# Patient Record
Sex: Female | Born: 1960 | Race: Black or African American | Hispanic: No | Smoking: Never smoker
Health system: Southern US, Community
[De-identification: ages and names within clinical notes are randomized; demographics above are authoritative.]

## PROBLEM LIST (undated history)

## (undated) DIAGNOSIS — N289 Disorder of kidney and ureter, unspecified: Secondary | ICD-10-CM

## (undated) DIAGNOSIS — E0590 Thyrotoxicosis, unspecified without thyrotoxic crisis or storm: Secondary | ICD-10-CM

## (undated) DIAGNOSIS — N186 End stage renal disease: Secondary | ICD-10-CM

## (undated) DIAGNOSIS — K219 Gastro-esophageal reflux disease without esophagitis: Secondary | ICD-10-CM

## (undated) DIAGNOSIS — I10 Essential (primary) hypertension: Secondary | ICD-10-CM

## (undated) DIAGNOSIS — I509 Heart failure, unspecified: Secondary | ICD-10-CM

## (undated) HISTORY — PX: AV FISTULA PLACEMENT: SHX1204

## (undated) HISTORY — PX: VASCULAR SURGERY: SHX849

## (undated) HISTORY — PX: CARDIAC CATHETERIZATION: SHX172

---

## 2016-07-02 DIAGNOSIS — I519 Heart disease, unspecified: Secondary | ICD-10-CM | POA: Insufficient documentation

## 2016-07-02 DIAGNOSIS — I429 Cardiomyopathy, unspecified: Secondary | ICD-10-CM | POA: Insufficient documentation

## 2016-07-02 DIAGNOSIS — R011 Cardiac murmur, unspecified: Secondary | ICD-10-CM | POA: Insufficient documentation

## 2017-12-02 DIAGNOSIS — K219 Gastro-esophageal reflux disease without esophagitis: Secondary | ICD-10-CM | POA: Insufficient documentation

## 2017-12-08 DIAGNOSIS — Z992 Dependence on renal dialysis: Secondary | ICD-10-CM | POA: Insufficient documentation

## 2017-12-08 DIAGNOSIS — I2510 Atherosclerotic heart disease of native coronary artery without angina pectoris: Secondary | ICD-10-CM | POA: Insufficient documentation

## 2017-12-08 DIAGNOSIS — N186 End stage renal disease: Secondary | ICD-10-CM | POA: Diagnosis present

## 2017-12-08 DIAGNOSIS — E785 Hyperlipidemia, unspecified: Secondary | ICD-10-CM | POA: Insufficient documentation

## 2017-12-15 DIAGNOSIS — I10 Essential (primary) hypertension: Secondary | ICD-10-CM | POA: Insufficient documentation

## 2017-12-15 DIAGNOSIS — R072 Precordial pain: Secondary | ICD-10-CM | POA: Insufficient documentation

## 2017-12-24 DIAGNOSIS — K449 Diaphragmatic hernia without obstruction or gangrene: Secondary | ICD-10-CM | POA: Insufficient documentation

## 2018-01-05 DIAGNOSIS — D539 Nutritional anemia, unspecified: Secondary | ICD-10-CM | POA: Insufficient documentation

## 2020-07-20 DIAGNOSIS — L120 Bullous pemphigoid: Secondary | ICD-10-CM | POA: Insufficient documentation

## 2020-10-19 DIAGNOSIS — R0902 Hypoxemia: Secondary | ICD-10-CM | POA: Insufficient documentation

## 2020-10-30 DIAGNOSIS — I5023 Acute on chronic systolic (congestive) heart failure: Secondary | ICD-10-CM | POA: Diagnosis present

## 2020-10-30 DIAGNOSIS — J189 Pneumonia, unspecified organism: Secondary | ICD-10-CM | POA: Insufficient documentation

## 2020-12-28 DIAGNOSIS — E8770 Fluid overload, unspecified: Secondary | ICD-10-CM | POA: Insufficient documentation

## 2021-01-14 DIAGNOSIS — I509 Heart failure, unspecified: Secondary | ICD-10-CM | POA: Insufficient documentation

## 2021-04-25 DIAGNOSIS — J9691 Respiratory failure, unspecified with hypoxia: Secondary | ICD-10-CM | POA: Diagnosis present

## 2021-04-26 DIAGNOSIS — N2581 Secondary hyperparathyroidism of renal origin: Secondary | ICD-10-CM | POA: Diagnosis present

## 2021-04-30 DIAGNOSIS — E441 Mild protein-calorie malnutrition: Secondary | ICD-10-CM | POA: Insufficient documentation

## 2021-05-07 ENCOUNTER — Emergency Department (HOSPITAL_COMMUNITY): Payer: 59

## 2021-05-07 ENCOUNTER — Inpatient Hospital Stay (HOSPITAL_COMMUNITY)
Admission: EM | Admit: 2021-05-07 | Discharge: 2021-05-10 | DRG: 291 | Disposition: A | Discharge status: Home / Self Care | Payer: 59 | Source: Other Acute Inpatient Hospital | Attending: Internal Medicine | Admitting: Internal Medicine

## 2021-05-07 ENCOUNTER — Encounter (HOSPITAL_COMMUNITY): Payer: Self-pay

## 2021-05-07 ENCOUNTER — Other Ambulatory Visit: Payer: Self-pay

## 2021-05-07 DIAGNOSIS — K219 Gastro-esophageal reflux disease without esophagitis: Secondary | ICD-10-CM | POA: Diagnosis present

## 2021-05-07 DIAGNOSIS — E785 Hyperlipidemia, unspecified: Secondary | ICD-10-CM | POA: Diagnosis present

## 2021-05-07 DIAGNOSIS — Z880 Allergy status to penicillin: Secondary | ICD-10-CM

## 2021-05-07 DIAGNOSIS — I151 Hypertension secondary to other renal disorders: Secondary | ICD-10-CM

## 2021-05-07 DIAGNOSIS — Z20822 Contact with and (suspected) exposure to covid-19: Secondary | ICD-10-CM | POA: Diagnosis present

## 2021-05-07 DIAGNOSIS — J9601 Acute respiratory failure with hypoxia: Secondary | ICD-10-CM | POA: Diagnosis not present

## 2021-05-07 DIAGNOSIS — I5023 Acute on chronic systolic (congestive) heart failure: Secondary | ICD-10-CM | POA: Diagnosis not present

## 2021-05-07 DIAGNOSIS — J9691 Respiratory failure, unspecified with hypoxia: Secondary | ICD-10-CM | POA: Diagnosis present

## 2021-05-07 DIAGNOSIS — I5043 Acute on chronic combined systolic (congestive) and diastolic (congestive) heart failure: Secondary | ICD-10-CM | POA: Diagnosis present

## 2021-05-07 DIAGNOSIS — E441 Mild protein-calorie malnutrition: Secondary | ICD-10-CM | POA: Insufficient documentation

## 2021-05-07 DIAGNOSIS — Z841 Family history of disorders of kidney and ureter: Secondary | ICD-10-CM

## 2021-05-07 DIAGNOSIS — R000 Tachycardia, unspecified: Secondary | ICD-10-CM | POA: Diagnosis present

## 2021-05-07 DIAGNOSIS — N2889 Other specified disorders of kidney and ureter: Secondary | ICD-10-CM

## 2021-05-07 DIAGNOSIS — I509 Heart failure, unspecified: Secondary | ICD-10-CM

## 2021-05-07 DIAGNOSIS — I10 Essential (primary) hypertension: Secondary | ICD-10-CM | POA: Diagnosis present

## 2021-05-07 DIAGNOSIS — I132 Hypertensive heart and chronic kidney disease with heart failure and with stage 5 chronic kidney disease, or end stage renal disease: Secondary | ICD-10-CM | POA: Diagnosis not present

## 2021-05-07 DIAGNOSIS — Z888 Allergy status to other drugs, medicaments and biological substances status: Secondary | ICD-10-CM

## 2021-05-07 DIAGNOSIS — N186 End stage renal disease: Secondary | ICD-10-CM | POA: Diagnosis not present

## 2021-05-07 DIAGNOSIS — Z7982 Long term (current) use of aspirin: Secondary | ICD-10-CM

## 2021-05-07 DIAGNOSIS — E872 Acidosis: Secondary | ICD-10-CM | POA: Diagnosis present

## 2021-05-07 DIAGNOSIS — Z681 Body mass index (BMI) 19 or less, adult: Secondary | ICD-10-CM

## 2021-05-07 DIAGNOSIS — Z79899 Other long term (current) drug therapy: Secondary | ICD-10-CM

## 2021-05-07 DIAGNOSIS — I248 Other forms of acute ischemic heart disease: Secondary | ICD-10-CM | POA: Diagnosis present

## 2021-05-07 DIAGNOSIS — Z992 Dependence on renal dialysis: Secondary | ICD-10-CM

## 2021-05-07 DIAGNOSIS — I5022 Chronic systolic (congestive) heart failure: Secondary | ICD-10-CM | POA: Diagnosis present

## 2021-05-07 DIAGNOSIS — E43 Unspecified severe protein-calorie malnutrition: Secondary | ICD-10-CM | POA: Insufficient documentation

## 2021-05-07 DIAGNOSIS — M898X9 Other specified disorders of bone, unspecified site: Secondary | ICD-10-CM | POA: Diagnosis present

## 2021-05-07 DIAGNOSIS — J811 Chronic pulmonary edema: Secondary | ICD-10-CM | POA: Diagnosis present

## 2021-05-07 DIAGNOSIS — J9621 Acute and chronic respiratory failure with hypoxia: Secondary | ICD-10-CM | POA: Diagnosis present

## 2021-05-07 DIAGNOSIS — D631 Anemia in chronic kidney disease: Secondary | ICD-10-CM | POA: Diagnosis present

## 2021-05-07 DIAGNOSIS — D7589 Other specified diseases of blood and blood-forming organs: Secondary | ICD-10-CM | POA: Diagnosis present

## 2021-05-07 HISTORY — DX: Heart failure, unspecified: I50.9

## 2021-05-07 HISTORY — DX: End stage renal disease: N18.6

## 2021-05-07 HISTORY — DX: Essential (primary) hypertension: I10

## 2021-05-07 HISTORY — DX: Disorder of kidney and ureter, unspecified: N28.9

## 2021-05-07 LAB — BASIC METABOLIC PANEL
Anion gap: 14 (ref 5–15)
BUN: 19 mg/dL (ref 8–23)
CO2: 27 mmol/L (ref 22–32)
Calcium: 12.5 mg/dL — ABNORMAL HIGH (ref 8.9–10.3)
Chloride: 99 mmol/L (ref 98–111)
Creatinine, Ser: 7.38 mg/dL — ABNORMAL HIGH (ref 0.44–1.00)
GFR, Estimated: 6 mL/min — ABNORMAL LOW (ref >60)
Glucose, Bld: 95 mg/dL (ref 70–99)
Potassium: 4.5 mmol/L (ref 3.5–5.1)
Sodium: 140 mmol/L (ref 135–145)

## 2021-05-07 LAB — CBC WITH DIFFERENTIAL/PLATELET
Abs Immature Granulocytes: 0.02 10*3/uL (ref 0.00–0.07)
Basophils Absolute: 0 10*3/uL (ref 0.0–0.1)
Basophils Relative: 1 %
Eosinophils Absolute: 0 10*3/uL (ref 0.0–0.5)
Eosinophils Relative: 1 %
HCT: 29.6 % — ABNORMAL LOW (ref 36.0–46.0)
Hemoglobin: 9.2 g/dL — ABNORMAL LOW (ref 12.0–15.0)
Immature Granulocytes: 0 %
Lymphocytes Relative: 24 %
Lymphs Abs: 1.4 10*3/uL (ref 0.7–4.0)
MCH: 33.2 pg (ref 26.0–34.0)
MCHC: 31.1 g/dL (ref 30.0–36.0)
MCV: 106.9 fL — ABNORMAL HIGH (ref 80.0–100.0)
Monocytes Absolute: 0.5 10*3/uL (ref 0.1–1.0)
Monocytes Relative: 8 %
Neutro Abs: 3.9 10*3/uL (ref 1.7–7.7)
Neutrophils Relative %: 66 %
Platelets: 185 10*3/uL (ref 150–400)
RBC: 2.77 MIL/uL — ABNORMAL LOW (ref 3.87–5.11)
RDW: 16 % — ABNORMAL HIGH (ref 11.5–15.5)
WBC: 5.8 10*3/uL (ref 4.0–10.5)
nRBC: 0 % (ref 0.0–0.2)

## 2021-05-07 LAB — BLOOD GAS, VENOUS
Acid-Base Excess: 2.4 mmol/L — ABNORMAL HIGH (ref 0.0–2.0)
Bicarbonate: 28.1 mmol/L — ABNORMAL HIGH (ref 20.0–28.0)
O2 Saturation: 15.7 %
Patient temperature: 98.6
pCO2, Ven: 52.8 mmHg (ref 44.0–60.0)
pH, Ven: 7.346 (ref 7.250–7.430)
pO2, Ven: <31 mmHg — CL (ref 32.0–45.0)

## 2021-05-07 LAB — RESP PANEL BY RT-PCR (FLU A&B, COVID) ARPGX2
Influenza A by PCR: NEGATIVE
Influenza B by PCR: NEGATIVE
SARS Coronavirus 2 by RT PCR: NEGATIVE

## 2021-05-07 LAB — MAGNESIUM: Magnesium: 2 mg/dL (ref 1.7–2.4)

## 2021-05-07 LAB — TROPONIN I (HIGH SENSITIVITY): Troponin I (High Sensitivity): 91 ng/L — ABNORMAL HIGH (ref <18)

## 2021-05-07 LAB — BRAIN NATRIURETIC PEPTIDE: B Natriuretic Peptide: >4500 pg/mL — ABNORMAL HIGH (ref 0.0–100.0)

## 2021-05-07 LAB — LACTIC ACID, PLASMA: Lactic Acid, Venous: 3.2 mmol/L (ref 0.5–1.9)

## 2021-05-07 MED ORDER — METOPROLOL TARTRATE 5 MG/5ML IV SOLN: 5.0000 mg | INTRAVENOUS | Status: DC

## 2021-05-07 MED ORDER — HEPARIN SODIUM (PORCINE) 5000 UNIT/ML IJ SOLN
5000.0000 [IU] | Freq: Three times a day (TID) | INTRAMUSCULAR | Status: DC
  Administered 2021-05-08 – 2021-05-10 (×3): 5000 [IU] via SUBCUTANEOUS
  Filled 2021-05-07 (×3): qty 1

## 2021-05-07 MED ORDER — METOPROLOL TARTRATE 50 MG PO TABS
50.0000 mg | ORAL_TABLET | Freq: Two times a day (BID) | ORAL | Status: AC
  Administered 2021-05-07 – 2021-05-08 (×3): 50 mg via ORAL
  Filled 2021-05-07: qty 1
  Filled 2021-05-07: qty 2
  Filled 2021-05-07 (×2): qty 1

## 2021-05-07 MED ORDER — SODIUM CHLORIDE 0.9 % IV SOLN
2.0000 g | Freq: Once | INTRAVENOUS | Status: AC
  Administered 2021-05-07: 2 g via INTRAVENOUS
  Filled 2021-05-07: qty 2

## 2021-05-07 MED ORDER — ONDANSETRON HCL 4 MG/2ML IJ SOLN
INTRAMUSCULAR | Status: AC
  Administered 2021-05-07: 4 mg via INTRAVENOUS
  Filled 2021-05-07: qty 2

## 2021-05-07 MED ORDER — ATORVASTATIN CALCIUM 40 MG PO TABS
40.0000 mg | ORAL_TABLET | Freq: Every day | ORAL | Status: DC
  Administered 2021-05-08 – 2021-05-09 (×2): 40 mg via ORAL
  Filled 2021-05-07 (×2): qty 1

## 2021-05-07 MED ORDER — VANCOMYCIN HCL IN DEXTROSE 1-5 GM/200ML-% IV SOLN
1000.0000 mg | Freq: Once | INTRAVENOUS | Status: DC
  Filled 2021-05-07: qty 200

## 2021-05-07 MED ORDER — IOHEXOL 350 MG/ML SOLN
80.0000 mL | Freq: Once | INTRAVENOUS | Status: AC | PRN
  Administered 2021-05-07: 80 mL via INTRAVENOUS

## 2021-05-07 MED ORDER — NITROGLYCERIN 2 % TD OINT
1.0000 [in_us] | TOPICAL_OINTMENT | Freq: Once | TRANSDERMAL | Status: AC
  Administered 2021-05-07: 1 [in_us] via TOPICAL
  Filled 2021-05-07: qty 30

## 2021-05-07 MED ORDER — ACETAMINOPHEN 650 MG RE SUPP: 650.0000 mg | Freq: Four times a day (QID) | RECTAL | Status: DC | PRN

## 2021-05-07 MED ORDER — ACETAMINOPHEN 325 MG PO TABS: 650.0000 mg | ORAL_TABLET | Freq: Four times a day (QID) | ORAL | Status: DC | PRN

## 2021-05-07 MED ORDER — ISOSORBIDE MONONITRATE ER 30 MG PO TB24
30.0000 mg | ORAL_TABLET | Freq: Every day | ORAL | Status: DC
  Administered 2021-05-08 – 2021-05-10 (×3): 30 mg via ORAL
  Filled 2021-05-07 (×4): qty 1

## 2021-05-07 MED ORDER — ONDANSETRON HCL 4 MG/2ML IJ SOLN
4.0000 mg | Freq: Four times a day (QID) | INTRAMUSCULAR | Status: DC | PRN
  Administered 2021-05-09: 4 mg via INTRAVENOUS
  Filled 2021-05-07: qty 2

## 2021-05-07 MED ORDER — CINACALCET HCL 30 MG PO TABS
30.0000 mg | ORAL_TABLET | Freq: Every day | ORAL | Status: DC
  Administered 2021-05-09: 30 mg via ORAL
  Filled 2021-05-07 (×3): qty 1

## 2021-05-07 MED ORDER — ONDANSETRON HCL 4 MG PO TABS: 4.0000 mg | ORAL_TABLET | Freq: Four times a day (QID) | ORAL | Status: DC | PRN

## 2021-05-07 NOTE — Progress Notes (Signed)
After PO metoprolol: HR now 112 and SBP 130, CP has improved / resolved.  Will hold off on IV metoprolol.

## 2021-05-07 NOTE — Progress Notes (Signed)
.  Transition of Care [facility]) - Emergency Department Mini Assessment   Patient Details  Name: Cynthia Bray MRN: [id number] Date of Birth: August 28, 1961  Transition of Care [facility]) CM/SW Contact:    Erenest Rasher, RN Phone Number: [phone] 05/07/2021, 9:16 PM   Clinical Narrative: TOC CM gave permission to speak to son, [name]. States she recently move in with niece, Hilda Blades. States he and niece assist pt to appt. States recently her medications came to $100 and not sure what medication was expensive. Her pharmacy is currently closed and unable to get information on medications. Pt has Medicaid that covers her meds. Discussed DME such as RW, oxygen or cane. Son states she does not have DME. Pt last admission to [facility] they were suppose to ship DME but he is not sure what equipment. Will continue to follow for dc needs.    ED Mini Assessment: What brought you to the Emergency Department? : shortness of breath  Barriers to Discharge: Continued Medical Work up        Interventions which prevented an admission or readmission: DME Provided, Other (must enter comment)    Patient Contact and Communications Key Contact 1: Tobie Poet   Spoke with: son Contact Date: 05/07/21,   Contact time: 2116 Contact Phone Number: [phone]           Admission diagnosis:  Chest Pain There are no problems to display for this patient.  PCP:  Pcp, No Pharmacy:   [place], [place] [address] Phone: [phone] Fax: [phone]

## 2021-05-07 NOTE — H&P (Signed)
History and Physical    Cynthia Bray ZOX:[id number] DOB: 03-Jun-1961 DOA: 05/07/2021  PCP: Pcp, No  Patient coming from: Home  I have personally briefly reviewed patient's old medical records in [place]  Chief Complaint: CP  HPI: Cynthia Bray is a 59 y.o. 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  Pt with recent admits to [facility], most recently 7/15 with CP, pulm edema, hypertension, tachycardia, and lactic acidosis.  Resp failure with initial BIPAP requirement.  Presentation felt to be due to volume overload and pt improved with dialysis.  Pt also put on BB and imdur.  Today pt went to dialysis, started having CP with dialysis so dialysis stopped part way through session and pt sent to ED.  Pt said she started feeling SOB yesterday, progressively worsened.  No leg swelling, no fevers, sore throat, congestion.   ED Course: Tachy to 140 initially, now 150.  RR 30-40, BP 811B-147W systolic.  HGB 9.2  WBC nl.  BNP > 4500, trop 91  CT chest: 1) CHF with pulm edema 2) B pulm consolidations concerning for multilobar PNA  COVID neg.  EDP gave cefepime + vanc.   Review of Systems: As per HPI, otherwise all review of systems negative.  Past Medical History:  Diagnosis Date   CHF (congestive heart failure) (HCC)    EF 25%   ESRD (end stage renal disease) ([place])    Hypertension    Renal disorder     Past Surgical History:  Procedure Laterality Date   AV FISTULA PLACEMENT       reports that she has never smoked. She has never used smokeless tobacco. She reports that she does not drink alcohol and does not use drugs.  Allergies  Allergen Reactions   Lisinopril     Angioedema     Family History  Problem Relation Age of Onset   Kidney disease Mother      Prior to Admission medications   Not on File    Physical Exam: Vitals:   05/07/21  1830 05/07/21 1900 05/07/21 2030 05/07/21 2100  BP: (!) 172/100 (!) 171/103 (!) 164/94 (!) 164/98  Pulse: (!) 143 (!) 144 (!) 138 (!) 138  Resp: (!) 28 (!) 36 (!) 27 (!) 31  Temp:      TempSrc:      SpO2: 97% 91% 98% 95%  Weight:      Height:        Constitutional: NAD, mild discomfort, no respiratory distress Eyes: PERRL, lids and conjunctivae normal ENMT: Mucous membranes are moist. Posterior pharynx clear of any exudate or lesions.Normal dentition.  Neck: normal, supple, no masses, no thyromegaly Respiratory: Bibasilar rhonchi Cardiovascular: Severe tachycardia Abdomen: no tenderness, no masses palpated. No hepatosplenomegaly. Bowel sounds positive.  Musculoskeletal: no clubbing / cyanosis. No joint deformity upper and lower extremities. Good ROM, no contractures. Normal muscle tone.  Skin: no rashes, lesions, ulcers. No induration Neurologic: CN 2-12 grossly intact. Sensation intact, DTR normal. Strength 5/5 in all 4.  Psychiatric: Normal judgment and insight. Alert and oriented x 3. Normal mood.    Labs on Admission: I have personally reviewed following labs and imaging studies  CBC: Recent Labs  Lab 05/07/21 1851  WBC 5.8  NEUTROABS 3.9  HGB 9.2*  HCT 29.6*  MCV 106.9*  PLT 295   Basic Metabolic Panel: Recent Labs  Lab 05/07/21 1656  NA 140  K 4.5  CL 99  CO2 27  GLUCOSE 95  BUN 19  CREATININE 7.38*  CALCIUM 12.5*  MG 2.0   GFR: Estimated Creatinine Clearance: 6.6 mL/min (A) (by C-G formula based on SCr of 7.38 mg/dL (H)). Liver Function Tests: No results for input(s): AST, ALT, ALKPHOS, BILITOT, PROT, ALBUMIN in the last 168 hours. No results for input(s): LIPASE, AMYLASE in the last 168 hours. No results for input(s): AMMONIA in the last 168 hours. Coagulation Profile: No results for input(s): INR, PROTIME in the last 168 hours. Cardiac Enzymes: No results for input(s): CKTOTAL, CKMB, CKMBINDEX, TROPONINI in the last 168 hours. BNP (last 3  results) No results for input(s): PROBNP in the last 8760 hours. HbA1C: No results for input(s): HGBA1C in the last 72 hours. CBG: No results for input(s): GLUCAP in the last 168 hours. Lipid Profile: No results for input(s): CHOL, HDL, LDLCALC, TRIG, CHOLHDL, LDLDIRECT in the last 72 hours. Thyroid Function Tests: No results for input(s): TSH, T4TOTAL, FREET4, T3FREE, THYROIDAB in the last 72 hours. Anemia Panel: No results for input(s): VITAMINB12, FOLATE, FERRITIN, TIBC, IRON, RETICCTPCT in the last 72 hours. Urine analysis: No results found for: COLORURINE, APPEARANCEUR, LABSPEC, [place], GLUCOSEU, HGBUR, BILIRUBINUR, KETONESUR, PROTEINUR, UROBILINOGEN, NITRITE, LEUKOCYTESUR  Radiological Exams on Admission: DG Chest 2 View  Result Date: 05/07/2021 CLINICAL DATA:  Chest pain since yesterday. EXAM: CHEST - 2 VIEW COMPARISON:  Single-view of the chest earlier today and 04/25/2021. FINDINGS: Right basilar airspace disease is worse than on the study earlier today. Left basilar airspace disease appears unchanged. There small bilateral pleural effusions. No pneumothorax. Cardiomegaly. IMPRESSION: Right basilar airspace disease is worse on the study earlier today could be due to increased atelectasis or pneumonia. Left basilar airspace disease is unchanged. Small bilateral pleural effusions are unchanged. Cardiomegaly. Electronically Signed   By: Inge Rise M.D.   On: 05/07/2021 18:55   CT Angio Chest PE W/Cm &/Or Wo Cm  Result Date: 05/07/2021 CLINICAL DATA:  60 year old female with concern for pulmonary embolism. EXAM: CT ANGIOGRAPHY CHEST WITH CONTRAST TECHNIQUE: Multidetector CT imaging of the chest was performed using the standard protocol during bolus administration of intravenous contrast. Multiplanar CT image reconstructions and MIPs were obtained to evaluate the vascular anatomy. CONTRAST:  58mL OMNIPAQUE IOHEXOL 350 MG/ML SOLN COMPARISON:  Chest radiograph dated 05/07/2021.  FINDINGS: Cardiovascular: There is cardiomegaly. No pericardial effusion. Mild atherosclerotic calcification of the thoracic aorta. Evaluation of the aorta is limited due to suboptimal opacification and streak artifact caused by patient's arms. Evaluation of the pulmonary arteries is limited due to respiratory motion artifact as well as streak artifact caused by patient's arms and suboptimal visualization of the peripheral branches. No pulmonary artery embolus identified. Mediastinum/Nodes: Right hilar adenopathy measures 2 cm. Evaluation of lymph node is very limited due to consolidative changes of the lungs. The esophagus is grossly unremarkable. No mediastinal fluid collection. Lungs/Pleura: Small bilateral pleural effusions. There is patchy areas of consolidative changes primarily involving the lower lobes. Patchy left upper lobe as well as diffuse ground-glass density in the right upper lobe noted. Findings concerning for multi lobar pneumonia. Clinical correlation and follow-up to resolution recommended. No pneumothorax. The central airways are patent. Upper Abdomen: Bilateral renal atrophy. Musculoskeletal: There is diffuse subcutaneous edema and anasarca. There is osteosclerosis consistent with renal osteodystrophy. No acute osseous pathology. Review of the MIP images confirms the above findings. IMPRESSION: 1. No CT evidence of pulmonary embolism. 2. Cardiomegaly with findings of CHF and small bilateral pleural  effusions. 3. Bilateral pulmonary consolidations concerning for multilobar pneumonia. Clinical correlation and follow-up to resolution recommended. 4. Aortic Atherosclerosis (ICD10-I70.0). Electronically Signed   By: Anner Crete M.D.   On: 05/07/2021 20:54    EKG: Independently reviewed.  Assessment/Plan Principal Problem:   Acute on chronic systolic CHF (congestive heart failure) (HCC) Active Problems:   ESRD (end stage renal disease) (HCC)   Hypercalcemia   HTN (hypertension)    Tachycardia    Acute on chronic systolic CHF - volume overload Transfer pt to [facility] for nephrology consult and likely dialysis Tele monitor Check serial trops with CP Tachycardia and uncontrolled HTN - Resume metoprolol 50mg  PO BID started during last admit at [facility] (gave PO dose in ED already) Giving 5mg  IV lopressor now Resume home IMDUR in AM Holding losartan (pt with h/o angioedema to lisinopril). ESRD - EDP spoke with nephrology, pt being admitted over to [facility] Hypercalcemia - Cont cinamet Hopefully to improve with dialysis ? Of PNA - Got cefepime + vanc in ED Check procalcitonin Holding off on further ABx orders for the moment pending procalcitonin results.  DVT prophylaxis: Heparin [place] Code Status: Full Family Communication: No family in room Disposition Plan: Home after improvement in CHF Consults called: EDP spoke with nephrology Dr. Johnney Ou Admission status: Place in obs     Lula Kolton, [place] Hospitalists  How to contact the [facility] Attending or Consulting provider [place] or covering provider during after hours [place], for this patient?  Check the care team in [facility] and look for a) attending/consulting TRH provider listed and b) the [facility] team listed Log into [email or web address]  Amion Physician Scheduling and messaging for groups and whole hospitals  On call and physician scheduling software for group practices, residents, hospitalists and other medical providers for call, clinic, rotation and shift schedules. On[employer] is a hospital-wide system for scheduling doctors and paging doctors on call. EasyPlot is for scientific plotting and data analysis.  [email or web address]  and use [place]'s universal password to access. If you do not have the password, please contact the hospital operator.  Locate the [facility] provider you are looking for under [facility] and page to a number that you can be directly reached. If you still have difficulty reaching the provider, please  page the [facility] (Director on Call) for the Hospitalists listed on amion for assistance.  05/07/2021, 10:48 PM

## 2021-05-07 NOTE — Progress Notes (Signed)
A consult was received from an ED physician for vanc per pharmacy dosing.  The patient's profile has been reviewed for ht/wt/allergies/indication/available labs.   A one time order has been placed for vanc 1g.  Further antibiotics/pharmacy consults should be ordered by admitting physician if indicated.                       Thank you, Kara Mead 05/07/2021  9:13 PM

## 2021-05-07 NOTE — ED Provider Notes (Signed)
Emergency Medicine Provider Triage Evaluation Note  Cynthia Bray , a 60 y.o. female  was evaluated in triage.  Pt complains of patient presents with shortness of breath, started yesterday, states she has a active cough, she denies chest pain, becoming diaphoretic, nausea, vomiting, lightheaded or dizziness.  She has no cardiac history, no history of PEs or DVTs, currently not on hormone therapy.  She is on dialysis, she goes Tuesday Thursday Saturday, got her full treatment on Saturday unfortunately got half of it today, she has no other complaints at this time..  Review of Systems  Positive: Shortness of breath, cough Negative: Chest pain, abdominal pain  Physical Exam  BP (!) 173/100 (BP Location: Left Arm)   Pulse (!) 140   Temp 98.1 F (36.7 C) (Oral)   Resp (!) 30   Ht 5\' 8"  (1.727 m)   Wt 52.2 kg   SpO2 96%   BMI 17.49 kg/m  Gen:   Awake, no distress   Resp:  Normal effort  MSK:   Moves extremities without difficulty  Other:    Medical Decision Making  Medically screening exam initiated at 4:56 PM.  Appropriate orders placed.  Randolm Idol was informed that the remainder of the evaluation will be completed by another provider, this initial triage assessment does not replace that evaluation, and the importance of remaining in the ED until their evaluation is complete.  Patient presents with shortness of breath, I am concerned for underlying cardiac abnormality and I recommend make this patient a level 2 and rooming this patient immediately as she will need further work-up.   Marcello Fennel, PA-C 05/07/21 1658    Malvin Johns, MD 05/07/21 [phone]

## 2021-05-07 NOTE — ED Provider Notes (Addendum)
[place] DEPT Provider Note   CSN: [id number] Arrival date & time: 05/07/21  1620     History Chief Complaint  Patient presents with   Chest Pain    Cynthia Bray is a 60 y.o. female.  60 year old female with past medical history below including ESRD on HD T/Th/Sat, CHF w/ EF 20-30%, HTN who p/w shortness of breath and chest pain.  Patient states that she began feeling short of breath yesterday and it has progressively worsened.  She has had intermittent chest pain for the past few days that is not sharp, is central and nonradiating.  She went to dialysis earlier today but they stopped her session part of the way through because of her complaints and sent her to the ED for evaluation.  Son states that they went to [facility] where they waited for a while and then decided to come here.  She reports cough which is mild, no fevers, congestion, sore throat, or sick contacts.  No leg swelling.  She got approximately half of her dialysis session today.  Son notes that she has had several recent hospitalizations for similar complaints.    Chart review shows that she was admitted on 7/14 at [facility] for respiratory failure with volume overload and CHF exacerbation as well as bacteremia.  The history is provided by the patient and a relative.  Chest Pain     Past Medical History:  Diagnosis Date   CHF (congestive heart failure) ([place])    Hypertension    Renal disorder     Patient Active Problem List   Diagnosis Date Noted   Acute on chronic systolic CHF (congestive heart failure) ([place]) 05/07/2021    Past Surgical History:  Procedure Laterality Date   AV FISTULA PLACEMENT       OB History   No obstetric history on file.     History reviewed. No pertinent family history.  Social History   Tobacco Use   Smoking status: Never   Smokeless tobacco: Never  Vaping Use   Vaping Use: Never used  Substance Use Topics   Alcohol  use: Never   Drug use: Never    Home Medications Prior to Admission medications   Not on File    Allergies    Patient has no known allergies.  Review of Systems   Review of Systems  Cardiovascular:  Positive for chest pain.  All other systems reviewed and are negative except that which was mentioned in HPI  Physical Exam Updated Vital Signs BP (!) 164/98   Pulse (!) 138   Temp 98.1 F (36.7 C) (Oral)   Resp (!) 31   Ht 5\' 8"  (1.727 m)   Wt 52.2 kg   SpO2 95%   BMI 17.49 kg/m   Physical Exam Constitutional:      General: She is not in acute distress.    Appearance: Normal appearance.  HENT:     Head: Normocephalic and atraumatic.  Eyes:     Conjunctiva/sclera: Conjunctivae normal.  Cardiovascular:     Rate and Rhythm: Regular rhythm. Tachycardia present.     Heart sounds: Murmur heard.  Pulmonary:     Comments: Mild tachypnea with diminished breath sounds bilaterally, faint crackles in bases, no respiratory distress Abdominal:     General: Abdomen is flat. Bowel sounds are normal. There is no distension.     Palpations: Abdomen is soft.     Tenderness: There is no abdominal tenderness.  Musculoskeletal:  Right lower leg: No edema.     Left lower leg: No edema.  Skin:    General: Skin is warm and dry.  Neurological:     Mental Status: She is alert and oriented to person, place, and time.     Comments: fluent  Psychiatric:        Mood and Affect: Mood normal.        Behavior: Behavior normal.    ED Results / Procedures / Treatments   Labs (all labs ordered are listed, but only abnormal results are displayed) Labs Reviewed  BASIC METABOLIC PANEL - Abnormal; Notable for the following components:      Result Value   Creatinine, Ser 7.38 (*)    Calcium 12.5 (*)    GFR, Estimated 6 (*)    All other components within normal limits  LACTIC ACID, PLASMA - Abnormal; Notable for the following components:   Lactic Acid, Venous 3.2 (*)    All other  components within normal limits  BRAIN NATRIURETIC PEPTIDE - Abnormal; Notable for the following components:   B Natriuretic Peptide >4,500.0 (*)    All other components within normal limits  BLOOD GAS, VENOUS - Abnormal; Notable for the following components:   pO2, Ven <31.0 (*)    Bicarbonate 28.1 (*)    Acid-Base Excess 2.4 (*)    All other components within normal limits  CBC WITH DIFFERENTIAL/PLATELET - Abnormal; Notable for the following components:   RBC 2.77 (*)    Hemoglobin 9.2 (*)    HCT 29.6 (*)    MCV 106.9 (*)    RDW 16.0 (*)    All other components within normal limits  TROPONIN I (HIGH SENSITIVITY) - Abnormal; Notable for the following components:   Troponin I (High Sensitivity) 91 (*)    All other components within normal limits  RESP PANEL BY RT-PCR (FLU A&B, COVID) ARPGX2  CULTURE, BLOOD (ROUTINE X 2)  CULTURE, BLOOD (ROUTINE X 2)  MAGNESIUM  CBC WITH DIFFERENTIAL/PLATELET  LACTIC ACID, PLASMA  PROCALCITONIN  TROPONIN I (HIGH SENSITIVITY)    EKG EKG Interpretation  Date/Time:  Tuesday May 07 2021 16:35:16 EDT Ventricular Rate:  143 PR Interval:  126 QRS Duration: 73 QT Interval:  355 QTC Calculation: 548 R Axis:   46 Text Interpretation: Sinus tachycardia Anteroseptal infarct, age indeterminate Prolonged QT interval Baseline wander in lead(s) I III aVL 12 Lead; [place] No previous ECGs available non-specific T wave flattening Confirmed by Theotis Burrow [phone]) on 05/07/2021 7:57:31 PM  Radiology DG Chest 2 View  Result Date: 05/07/2021 CLINICAL DATA:  Chest pain since yesterday. EXAM: CHEST - 2 VIEW COMPARISON:  Single-view of the chest earlier today and 04/25/2021. FINDINGS: Right basilar airspace disease is worse than on the study earlier today. Left basilar airspace disease appears unchanged. There small bilateral pleural effusions. No pneumothorax. Cardiomegaly. IMPRESSION: Right basilar airspace disease is worse on the study earlier today could  be due to increased atelectasis or pneumonia. Left basilar airspace disease is unchanged. Small bilateral pleural effusions are unchanged. Cardiomegaly. Electronically Signed   By: Inge Rise M.D.   On: 05/07/2021 18:55   CT Angio Chest PE W/Cm &/Or Wo Cm  Result Date: 05/07/2021 CLINICAL DATA:  60 year old female with concern for pulmonary embolism. EXAM: CT ANGIOGRAPHY CHEST WITH CONTRAST TECHNIQUE: Multidetector CT imaging of the chest was performed using the standard protocol during bolus administration of intravenous contrast. Multiplanar CT image reconstructions and MIPs were obtained to evaluate the vascular anatomy. CONTRAST:  98mL OMNIPAQUE IOHEXOL 350 MG/ML SOLN COMPARISON:  Chest radiograph dated 05/07/2021. FINDINGS: Cardiovascular: There is cardiomegaly. No pericardial effusion. Mild atherosclerotic calcification of the thoracic aorta. Evaluation of the aorta is limited due to suboptimal opacification and streak artifact caused by patient's arms. Evaluation of the pulmonary arteries is limited due to respiratory motion artifact as well as streak artifact caused by patient's arms and suboptimal visualization of the peripheral branches. No pulmonary artery embolus identified. Mediastinum/Nodes: Right hilar adenopathy measures 2 cm. Evaluation of lymph node is very limited due to consolidative changes of the lungs. The esophagus is grossly unremarkable. No mediastinal fluid collection. Lungs/Pleura: Small bilateral pleural effusions. There is patchy areas of consolidative changes primarily involving the lower lobes. Patchy left upper lobe as well as diffuse ground-glass density in the right upper lobe noted. Findings concerning for multi lobar pneumonia. Clinical correlation and follow-up to resolution recommended. No pneumothorax. The central airways are patent. Upper Abdomen: Bilateral renal atrophy. Musculoskeletal: There is diffuse subcutaneous edema and anasarca. There is osteosclerosis  consistent with renal osteodystrophy. No acute osseous pathology. Review of the MIP images confirms the above findings. IMPRESSION: 1. No CT evidence of pulmonary embolism. 2. Cardiomegaly with findings of CHF and small bilateral pleural effusions. 3. Bilateral pulmonary consolidations concerning for multilobar pneumonia. Clinical correlation and follow-up to resolution recommended. 4. Aortic Atherosclerosis (ICD10-I70.0). Electronically Signed   By: Anner Crete M.D.   On: 05/07/2021 20:54    Procedures Procedures  CRITICAL CARE Performed by: Wenda Overland Kayna Suppa   Total critical care time: 30 minutes  Critical care time was exclusive of separately billable procedures and treating other patients.  Critical care was necessary to treat or prevent imminent or life-threatening deterioration.  Critical care was time spent personally by me on the following activities: development of treatment plan with patient and/or surrogate as well as nursing, discussions with consultants, evaluation of patient's response to treatment, examination of patient, obtaining history from patient or surrogate, ordering and performing treatments and interventions, ordering and review of laboratory studies, ordering and review of radiographic studies, pulse oximetry and re-evaluation of patient's condition.  Medications Ordered in ED Medications  ceFEPIme (MAXIPIME) 2 g in sodium chloride 0.9 % 100 mL IVPB (has no administration in time range)  vancomycin (VANCOCIN) IVPB 1000 mg/200 mL premix (has no administration in time range)  nitroGLYCERIN (NITROGLYN) 2 % ointment 1 inch (1 inch Topical Given 05/07/21 2134)  iohexol (OMNIPAQUE) 350 MG/ML injection 80 mL (80 mLs Intravenous Contrast Given 05/07/21 2013)    ED Course  I have reviewed the triage vital signs and the nursing notes.  Pertinent labs & imaging results that were available during my care of the patient were reviewed by me and considered in my medical  decision making (see chart for details).    MDM Rules/Calculators/A&P                           Tachypneic and tachycardic on exam, requiring 2 to 3 L of oxygen, no distress.  VBG reassuring.  Lab work shows normal WBC count, hemoglobin 9.2, COVID-negative, potassium 4.5, calcium 12.5, troponin 91 which is likely demand his BNP is greater than 4500, lactate 3.2 VBG reassuring with pH 7.35, PCO2 53.  Obtain CTA of chest to rule out PE given the patient's tachycardia, chest pain, and recent hospitalization.  Imaging negative for PE but does show congestive heart failure findings with bilateral pleural effusions.  She has some areas  of consolidations that may represent multi lobar pneumonia.  Because of her recent hospitalization, will cover for HCAP w/ vanc and cefepime; blood cultures sent. Discussed w/ nephrology, Dr. Johnney Ou, who recommended admission to [facility] so that she can receive dialysis.  I gave the patient Nitropaste to help with blood pressure and breathing.  Discussed admission with [facility] hospitalist, Dr. Alcario Drought.  Cynthia Bray was evaluated in Emergency Department on 05/08/2021 for the symptoms described in the history of present illness. She was evaluated in the context of the global COVID-19 pandemic, which necessitated consideration that the patient might be at risk for infection with the SARS-CoV-2 virus that causes COVID-19. Institutional protocols and algorithms that pertain to the evaluation of patients at risk for COVID-19 are in a state of rapid change based on information released by regulatory bodies including the CDC and federal and state organizations. These policies and algorithms were followed during the patient's care in the ED.  Final Clinical Impression(s) / ED Diagnoses Final diagnoses:  Acute respiratory failure with hypoxia ([place])  Acute on chronic congestive heart failure, unspecified heart failure type [facility])    Rx / DC Orders ED Discharge Orders     None         Nemesis Rainwater, Wenda Overland, MD 05/07/21 2150    Rex Kras, Wenda Overland, MD 05/08/21 0011

## 2021-05-07 NOTE — ED Notes (Signed)
Unsuccessful IV attempt x 2.  Ronalee Belts RN asked to attempt.

## 2021-05-07 NOTE — ED Notes (Signed)
Pt noted to have O2 sat between 87-90% RA.  Pt placed on 3L [place].  Sat increased to 91%.

## 2021-05-07 NOTE — ED Triage Notes (Signed)
Pt to er, pt states that she is here for chest pain, states that she gets dialysis tues, thurs, Saturday, states that her pain started Monday, states that the pain is off and on, states that she also has some sob

## 2021-05-08 ENCOUNTER — Observation Stay (HOSPITAL_BASED_OUTPATIENT_CLINIC_OR_DEPARTMENT_OTHER): Payer: 59

## 2021-05-08 ENCOUNTER — Encounter (HOSPITAL_COMMUNITY): Payer: Self-pay | Admitting: Internal Medicine

## 2021-05-08 DIAGNOSIS — Z992 Dependence on renal dialysis: Secondary | ICD-10-CM | POA: Diagnosis not present

## 2021-05-08 DIAGNOSIS — R011 Cardiac murmur, unspecified: Secondary | ICD-10-CM

## 2021-05-08 DIAGNOSIS — I5043 Acute on chronic combined systolic (congestive) and diastolic (congestive) heart failure: Secondary | ICD-10-CM | POA: Diagnosis not present

## 2021-05-08 DIAGNOSIS — Z888 Allergy status to other drugs, medicaments and biological substances status: Secondary | ICD-10-CM | POA: Diagnosis not present

## 2021-05-08 DIAGNOSIS — D631 Anemia in chronic kidney disease: Secondary | ICD-10-CM | POA: Diagnosis not present

## 2021-05-08 DIAGNOSIS — I5023 Acute on chronic systolic (congestive) heart failure: Secondary | ICD-10-CM | POA: Diagnosis not present

## 2021-05-08 DIAGNOSIS — J9601 Acute respiratory failure with hypoxia: Secondary | ICD-10-CM

## 2021-05-08 DIAGNOSIS — E785 Hyperlipidemia, unspecified: Secondary | ICD-10-CM | POA: Diagnosis not present

## 2021-05-08 DIAGNOSIS — K219 Gastro-esophageal reflux disease without esophagitis: Secondary | ICD-10-CM | POA: Diagnosis not present

## 2021-05-08 DIAGNOSIS — N186 End stage renal disease: Secondary | ICD-10-CM | POA: Diagnosis not present

## 2021-05-08 DIAGNOSIS — Z79899 Other long term (current) drug therapy: Secondary | ICD-10-CM | POA: Diagnosis not present

## 2021-05-08 DIAGNOSIS — I248 Other forms of acute ischemic heart disease: Secondary | ICD-10-CM | POA: Diagnosis not present

## 2021-05-08 DIAGNOSIS — Z681 Body mass index (BMI) 19 or less, adult: Secondary | ICD-10-CM | POA: Diagnosis not present

## 2021-05-08 DIAGNOSIS — Z880 Allergy status to penicillin: Secondary | ICD-10-CM | POA: Diagnosis not present

## 2021-05-08 DIAGNOSIS — D7589 Other specified diseases of blood and blood-forming organs: Secondary | ICD-10-CM | POA: Diagnosis not present

## 2021-05-08 DIAGNOSIS — Z7982 Long term (current) use of aspirin: Secondary | ICD-10-CM | POA: Diagnosis not present

## 2021-05-08 DIAGNOSIS — Z20822 Contact with and (suspected) exposure to covid-19: Secondary | ICD-10-CM | POA: Diagnosis not present

## 2021-05-08 DIAGNOSIS — M898X9 Other specified disorders of bone, unspecified site: Secondary | ICD-10-CM | POA: Diagnosis not present

## 2021-05-08 DIAGNOSIS — I132 Hypertensive heart and chronic kidney disease with heart failure and with stage 5 chronic kidney disease, or end stage renal disease: Secondary | ICD-10-CM | POA: Diagnosis not present

## 2021-05-08 DIAGNOSIS — E872 Acidosis: Secondary | ICD-10-CM | POA: Diagnosis not present

## 2021-05-08 DIAGNOSIS — J9621 Acute and chronic respiratory failure with hypoxia: Secondary | ICD-10-CM | POA: Diagnosis not present

## 2021-05-08 DIAGNOSIS — E43 Unspecified severe protein-calorie malnutrition: Secondary | ICD-10-CM | POA: Diagnosis not present

## 2021-05-08 DIAGNOSIS — Z841 Family history of disorders of kidney and ureter: Secondary | ICD-10-CM | POA: Diagnosis not present

## 2021-05-08 LAB — CBC
HCT: 27.8 % — ABNORMAL LOW (ref 36.0–46.0)
Hemoglobin: 8.7 g/dL — ABNORMAL LOW (ref 12.0–15.0)
MCH: 32.3 pg (ref 26.0–34.0)
MCHC: 31.3 g/dL (ref 30.0–36.0)
MCV: 103.3 fL — ABNORMAL HIGH (ref 80.0–100.0)
Platelets: 165 10*3/uL (ref 150–400)
RBC: 2.69 MIL/uL — ABNORMAL LOW (ref 3.87–5.11)
RDW: 15.8 % — ABNORMAL HIGH (ref 11.5–15.5)
WBC: 4.7 10*3/uL (ref 4.0–10.5)
nRBC: 0 % (ref 0.0–0.2)

## 2021-05-08 LAB — MRSA NEXT GEN BY PCR, NASAL: MRSA by PCR Next Gen: NOT DETECTED

## 2021-05-08 LAB — ECHOCARDIOGRAM COMPLETE
Area-P 1/2: 5.66 cm2
Calc EF: 27.4 %
Height: 68 in
MV M vel: 4.77 m/s
MV Peak grad: 91 mmHg
Radius: 0.2 cm
S' Lateral: 4.4 cm
Single Plane A2C EF: 32.3 %
Single Plane A4C EF: 24.5 %
Weight: 1721.35 oz

## 2021-05-08 LAB — BASIC METABOLIC PANEL
Anion gap: 11 (ref 5–15)
BUN: 22 mg/dL (ref 8–23)
CO2: 28 mmol/L (ref 22–32)
Calcium: 11.6 mg/dL — ABNORMAL HIGH (ref 8.9–10.3)
Chloride: 98 mmol/L (ref 98–111)
Creatinine, Ser: 8.08 mg/dL — ABNORMAL HIGH (ref 0.44–1.00)
GFR, Estimated: 5 mL/min — ABNORMAL LOW (ref >60)
Glucose, Bld: 85 mg/dL (ref 70–99)
Potassium: 5.1 mmol/L (ref 3.5–5.1)
Sodium: 137 mmol/L (ref 135–145)

## 2021-05-08 LAB — HEPATITIS B SURFACE ANTIGEN: Hepatitis B Surface Ag: NONREACTIVE

## 2021-05-08 LAB — TROPONIN I (HIGH SENSITIVITY)
Troponin I (High Sensitivity): 127 ng/L (ref <18)
Troponin I (High Sensitivity): 98 ng/L — ABNORMAL HIGH (ref <18)

## 2021-05-08 LAB — HEPATITIS B CORE ANTIBODY, TOTAL: Hep B Core Total Ab: NONREACTIVE

## 2021-05-08 LAB — PROCALCITONIN: Procalcitonin: 1.28 ng/mL

## 2021-05-08 LAB — LACTIC ACID, PLASMA: Lactic Acid, Venous: 2.9 mmol/L (ref 0.5–1.9)

## 2021-05-08 LAB — HIV ANTIBODY (ROUTINE TESTING W REFLEX): HIV Screen 4th Generation wRfx: NONREACTIVE

## 2021-05-08 MED ORDER — RENA-VITE PO TABS
1.0000 | ORAL_TABLET | Freq: Every day | ORAL | Status: DC
  Administered 2021-05-08: 1 via ORAL
  Filled 2021-05-08: qty 1

## 2021-05-08 MED ORDER — NEPRO/CARBSTEADY PO LIQD
237.0000 mL | Freq: Two times a day (BID) | ORAL | Status: DC
  Administered 2021-05-08 – 2021-05-10 (×3): 237 mL via ORAL

## 2021-05-08 MED ORDER — CHLORHEXIDINE GLUCONATE CLOTH 2 % EX PADS: 6.0000 | MEDICATED_PAD | Freq: Every day | CUTANEOUS | Status: DC

## 2021-05-08 MED ORDER — DARBEPOETIN ALFA 60 MCG/0.3ML IJ SOSY: 60.0000 ug | PREFILLED_SYRINGE | INTRAMUSCULAR | Status: DC

## 2021-05-08 MED ORDER — DARBEPOETIN ALFA 60 MCG/0.3ML IJ SOSY
PREFILLED_SYRINGE | INTRAMUSCULAR | Status: AC
  Administered 2021-05-08: 60 ug via ARTERIOVENOUS_FISTULA
  Filled 2021-05-08: qty 0.3

## 2021-05-08 MED ORDER — CINACALCET HCL 30 MG PO TABS
30.0000 mg | ORAL_TABLET | ORAL | Status: DC
  Administered 2021-05-09: 30 mg via ORAL
  Filled 2021-05-08: qty 1

## 2021-05-08 MED ORDER — METOPROLOL SUCCINATE ER 100 MG PO TB24
100.0000 mg | ORAL_TABLET | Freq: Every day | ORAL | Status: DC
  Administered 2021-05-09 – 2021-05-10 (×2): 100 mg via ORAL
  Filled 2021-05-08 (×2): qty 1

## 2021-05-08 MED ORDER — HYDRALAZINE HCL 25 MG PO TABS
25.0000 mg | ORAL_TABLET | Freq: Three times a day (TID) | ORAL | Status: DC
  Administered 2021-05-08 – 2021-05-09 (×3): 25 mg via ORAL
  Filled 2021-05-08 (×2): qty 1

## 2021-05-08 NOTE — Progress Notes (Signed)
Heart Failure Navigator Progress Note  Assessed for Heart & Vascular TOC clinic readiness.  The patient does not meet criteria due to ESRD on HD TTS.   Navigator available for reassessment of patient.   Kerby Nora, PharmD, BCPS Heart Failure Stewardship Pharmacist Phone [phone]

## 2021-05-08 NOTE — Progress Notes (Signed)
Patient ID: Cynthia Bray, female   DOB: 11-22-1960, 60 y.o.   MRN: [id number]  PROGRESS NOTE    Chariti Havel  WER:[id number] DOB: [birth year] DOA: 05/07/2021 PCP: Pcp, No   Brief Narrative:  60 year old female with history of end-stage renal disease on hemodialysis, hypertension, chronic systolic heart failure with EF of 25 to 30%, recent staph epidermidis bacteremia status post completion of Ancef on 05/02/2021, recent admission to [facility] on 04/26/2021 with chest pain, pulmonary edema, respiratory failure requiring BiPAP with improvement of symptoms after dialysis presented with chest pain and shortness of breath which resulted in stopping of dialysis.  On presentation, she was tachycardic with heart rates up to 150 and respiratory rate of 30s to 40s.  BNP of more than 4500 and troponin was 91.  CT chest showed CHF with pulmonary edema and bilateral pulmonary consolidations concerning for multilobar pneumonia.  COVID testing was negative.  She was given cefepime and vancomycin by ED provider.  Assessment & Plan:   Acute on chronic systolic CHF/volume overload/pulmonary edema Positive troponin: Most likely from demand ischemia from above -Patient's volume has recently been managed by dialysis.  Presented with chest pain and shortness of breath and CT chest showed findings suggestive of CHF with bilateral pleural effusions and?  Multilobar pneumonia.  COVID testing was negative on presentation.  Received a dose of cefepime and vancomycin in the ED.  Doubt that patient has bacterial pneumonia and will hold off on antibiotics. -Nephrology consult has been requested for continuation of dialysis. -I have also consulted cardiology. -Strict input and output.  Daily weights.  Fluid restriction.  Will repeat a.m. procalcitonin -Continue isosorbide mononitrate and beta-blocker.  Losartan on hold since patient has history of angioedema to lisinopril  End-stage renal disease on  hemodialysis -Follow nephrology recommendations  Hypercalcemia -Hopefully will improve with dialysis.  Continue Cinacalcet  Anemia of chronic disease/macrocytosis -Hemoglobin stable.  No signs of bleeding.  Hypertension -Monitor blood pressure.  Continue metoprolol and Imdur.   DVT prophylaxis: Heparin subcutaneous Code Status: Full Family Communication: None at bedside Disposition Plan: Status is: Observation  The patient will require care spanning > 2 midnights and should be moved to inpatient because: Inpatient level of care appropriate due to severity of illness  Dispo: The patient is from: Home              Anticipated d/c is to: Home              Patient currently is not medically stable to d/c.   Difficult to place patient No   Consultants: Nephrology/cardiology  Procedures: None  Antimicrobials: 1 dose of cefepime and vancomycin in the ED   Subjective: Patient seen and examined at bedside.  Still complains of intermittent chest pain and shortness of breath.  Feels slightly better.  No overnight vomiting or worsening abdominal pain reported.  Objective: Vitals:   05/08/21 0245 05/08/21 0615 05/08/21 0740 05/08/21 0819  BP: 125/77 140/80 (!) 136/91 (!) 142/85  Pulse: 99 97 98 98  Resp: (!) 28 (!) 25 (!) 22 (!) 22  Temp:    97.6 F (36.4 C)  TempSrc:    Oral  SpO2: 99% 100% 95% 100%  Weight:    48.8 kg  Height:    5\' 8"  (1.727 m)   No intake or output data in the 24 hours ending 05/08/21 1006 Filed Weights   05/07/21 1633 05/08/21 0819  Weight: 52.2 kg 48.8 kg    Examination:  General exam:  Appears calm and comfortable.  Currently on 2 L oxygen via nasal cannula Respiratory system: Bilateral decreased breath sounds at bases with some scattered crackles; intermittently tachypneic Cardiovascular system: S1 & S2 heard, Rate controlled Gastrointestinal system: Abdomen is nondistended, soft and nontender. Normal bowel sounds heard. Extremities: No  cyanosis, clubbing; trace lower extremity edema Central nervous system: Alert and oriented. No focal neurological deficits. Moving extremities Skin: No rashes, lesions or ulcers Psychiatry: Affect is mostly flat.  Does not participate in conversation much.   Data Reviewed: I have personally reviewed following labs and imaging studies  CBC: Recent Labs  Lab 05/07/21 1851  WBC 5.8  NEUTROABS 3.9  HGB 9.2*  HCT 29.6*  MCV 106.9*  PLT 174   Basic Metabolic Panel: Recent Labs  Lab 05/07/21 1656 05/08/21 0831  NA 140 137  K 4.5 5.1  CL 99 98  CO2 27 28  GLUCOSE 95 85  BUN 19 22  CREATININE 7.38* 8.08*  CALCIUM 12.5* 11.6*  MG 2.0  --    GFR: Estimated Creatinine Clearance: 5.6 mL/min (A) (by C-G formula based on SCr of 8.08 mg/dL (H)). Liver Function Tests: No results for input(s): AST, ALT, ALKPHOS, BILITOT, PROT, ALBUMIN in the last 168 hours. No results for input(s): LIPASE, AMYLASE in the last 168 hours. No results for input(s): AMMONIA in the last 168 hours. Coagulation Profile: No results for input(s): INR, PROTIME in the last 168 hours. Cardiac Enzymes: No results for input(s): CKTOTAL, CKMB, CKMBINDEX, TROPONINI in the last 168 hours. BNP (last 3 results) No results for input(s): PROBNP in the last 8760 hours. HbA1C: No results for input(s): HGBA1C in the last 72 hours. CBG: No results for input(s): GLUCAP in the last 168 hours. Lipid Profile: No results for input(s): CHOL, HDL, LDLCALC, TRIG, CHOLHDL, LDLDIRECT in the last 72 hours. Thyroid Function Tests: No results for input(s): TSH, T4TOTAL, FREET4, T3FREE, THYROIDAB in the last 72 hours. Anemia Panel: No results for input(s): VITAMINB12, FOLATE, FERRITIN, TIBC, IRON, RETICCTPCT in the last 72 hours. Sepsis Labs: Recent Labs  Lab 05/07/21 1656 05/07/21 2330  PROCALCITON  --  1.28  LATICACIDVEN 3.2* 2.9*    Recent Results (from the past 240 hour(s))  Resp Panel by RT-PCR (Flu A&B, Covid)  Nasopharyngeal Swab     Status: None   Collection Time: 05/07/21  4:57 PM   Specimen: Nasopharyngeal Swab; Nasopharyngeal(NP) swabs in vial transport medium  Result Value Ref Range Status   SARS Coronavirus 2 by RT PCR NEGATIVE NEGATIVE Final    Comment: (NOTE) SARS-CoV-2 target nucleic acids are NOT DETECTED.  The SARS-CoV-2 RNA is generally detectable in upper respiratory specimens during the acute phase of infection. The lowest concentration of SARS-CoV-2 viral copies this assay can detect is 138 copies/mL. A negative result does not preclude SARS-Cov-2 infection and should not be used as the sole basis for treatment or other patient management decisions. A negative result may occur with  improper specimen collection/handling, submission of specimen other than nasopharyngeal swab, presence of viral mutation(s) within the areas targeted by this assay, and inadequate number of viral copies(<138 copies/mL). A negative result must be combined with clinical observations, patient history, and epidemiological information. The expected result is Negative.  Fact Sheet for Patients:  EntrepreneurPulse.com.au  Fact Sheet for Healthcare Providers:  IncredibleEmployment.be  This test is no t yet approved or cleared by the [place] FDA and  has been authorized for detection and/or diagnosis of SARS-CoV-2 by FDA under an Emergency Use  Authorization (EUA). This EUA will remain  in effect (meaning this test can be used) for the duration of the COVID-19 declaration under Section 564(b)(1) of the Act, 21 U.S.C.section 360bbb-3(b)(1), unless the authorization is terminated  or revoked sooner.       Influenza A by PCR NEGATIVE NEGATIVE Final   Influenza B by PCR NEGATIVE NEGATIVE Final    Comment: (NOTE) The Xpert Xpress SARS-CoV-2/FLU/RSV plus assay is intended as an aid in the diagnosis of influenza from Nasopharyngeal swab specimens and should not be  used as a sole basis for treatment. Nasal washings and aspirates are unacceptable for Xpert Xpress SARS-CoV-2/FLU/RSV testing.  Fact Sheet for Patients: EntrepreneurPulse.com.au  Fact Sheet for Healthcare Providers: IncredibleEmployment.be  This test is not yet approved or cleared by the [place] FDA and has been authorized for detection and/or diagnosis of SARS-CoV-2 by FDA under an Emergency Use Authorization (EUA). This EUA will remain in effect (meaning this test can be used) for the duration of the COVID-19 declaration under Section 564(b)(1) of the Act, 21 U.S.C. section 360bbb-3(b)(1), unless the authorization is terminated or revoked.  Performed at [facility], [place] [address] [place], [place] [address]   MRSA Next Gen by PCR, Nasal     Status: None   Collection Time: 05/08/21  8:34 AM   Specimen: Nasal Mucosa; Nasal Swab  Result Value Ref Range Status   MRSA by PCR Next Gen NOT DETECTED NOT DETECTED Final    Comment: (NOTE) The GeneXpert MRSA Assay (FDA approved for NASAL specimens only), is one component of a comprehensive MRSA colonization surveillance program. It is not intended to diagnose MRSA infection nor to guide or monitor treatment for MRSA infections. Test performance is not FDA approved in patients less than 7 years old. Performed at [facility], [place] [address], [place], [place] [address]          Radiology Studies: DG Chest 2 View  Result Date: 05/07/2021 CLINICAL DATA:  Chest pain since yesterday. EXAM: CHEST - 2 VIEW COMPARISON:  Single-view of the chest earlier today and 04/25/2021. FINDINGS: Right basilar airspace disease is worse than on the study earlier today. Left basilar airspace disease appears unchanged. There small bilateral pleural effusions. No pneumothorax. Cardiomegaly. IMPRESSION: Right basilar airspace disease is worse on the study earlier today could be due to  increased atelectasis or pneumonia. Left basilar airspace disease is unchanged. Small bilateral pleural effusions are unchanged. Cardiomegaly. Electronically Signed   By: Inge Rise M.D.   On: 05/07/2021 18:55   CT Angio Chest PE W/Cm &/Or Wo Cm  Result Date: 05/07/2021 CLINICAL DATA:  60 year old female with concern for pulmonary embolism. EXAM: CT ANGIOGRAPHY CHEST WITH CONTRAST TECHNIQUE: Multidetector CT imaging of the chest was performed using the standard protocol during bolus administration of intravenous contrast. Multiplanar CT image reconstructions and MIPs were obtained to evaluate the vascular anatomy. CONTRAST:  8mL OMNIPAQUE IOHEXOL 350 MG/ML SOLN COMPARISON:  Chest radiograph dated 05/07/2021. FINDINGS: Cardiovascular: There is cardiomegaly. No pericardial effusion. Mild atherosclerotic calcification of the thoracic aorta. Evaluation of the aorta is limited due to suboptimal opacification and streak artifact caused by patient's arms. Evaluation of the pulmonary arteries is limited due to respiratory motion artifact as well as streak artifact caused by patient's arms and suboptimal visualization of the peripheral branches. No pulmonary artery embolus identified. Mediastinum/Nodes: Right hilar adenopathy measures 2 cm. Evaluation of lymph node is very limited due to consolidative changes of the lungs. The esophagus  is grossly unremarkable. No mediastinal fluid collection. Lungs/Pleura: Small bilateral pleural effusions. There is patchy areas of consolidative changes primarily involving the lower lobes. Patchy left upper lobe as well as diffuse ground-glass density in the right upper lobe noted. Findings concerning for multi lobar pneumonia. Clinical correlation and follow-up to resolution recommended. No pneumothorax. The central airways are patent. Upper Abdomen: Bilateral renal atrophy. Musculoskeletal: There is diffuse subcutaneous edema and anasarca. There is osteosclerosis consistent  with renal osteodystrophy. No acute osseous pathology. Review of the MIP images confirms the above findings. IMPRESSION: 1. No CT evidence of pulmonary embolism. 2. Cardiomegaly with findings of CHF and small bilateral pleural effusions. 3. Bilateral pulmonary consolidations concerning for multilobar pneumonia. Clinical correlation and follow-up to resolution recommended. 4. Aortic Atherosclerosis (ICD10-I70.0). Electronically Signed   By: Anner Crete M.D.   On: 05/07/2021 20:54        Scheduled Meds:  atorvastatin  40 mg Oral q1800   cinacalcet  30 mg Oral Q breakfast   heparin  5,000 Units Subcutaneous Q8H   isosorbide mononitrate  30 mg Oral Daily   metoprolol tartrate  50 mg Oral BID   Continuous Infusions:        Aline August, MD [facility] 05/08/2021, 10:06 AM

## 2021-05-08 NOTE — Progress Notes (Signed)
  Echocardiogram 2D Echocardiogram has been performed.  Fidel Levy 05/08/2021, 4:06 PM

## 2021-05-08 NOTE — Consult Note (Addendum)
Cardiology Consultation:   Patient ID: Cynthia Bray MRN: [id number]; DOB: [birth year]  Admit date: 05/07/2021 Date of Consult: 05/08/2021  PCP:  Merryl Hacker, No   [facility] Providers Cardiologist:  None    New, Dr. Sallyanne Kuster  Patient Profile:   Cynthia Bray is a 60 y.o. female with a hx of ESRD on HD (MWF), Chronic systolic HF (08%), HLD, HTN, GERD who is being seen 05/08/2021 for the evaluation of CHF at the request of Dr. Starla Link.  History of Present Illness:   [name] is a 60 year old female with past medical history noted above.  Appears she has received most of her care through [facility].  She was admitted 3/22 with complaints of chest pain.  Noted to have elevated high-sensitivity troponin in the 178-196 range.  During that admission she underwent pharmacological stress testing which showed small but mild reversible defect in the anterior septal wall consistent with ischemia.  Underwent cardiac catheterization which is reported as no significant coronary artery disease with elevated LVEDP.  Symptoms improved after undergoing HD.  She was again admitted 04/11/2021 with chest pain and found to have staph epi bacteremia, hypercalcemia and newly depressed EF of 20 to 25%, which was down from previous echocardiogram in January 2022 with an EF of 40 to 45%.  It was suspected that this was nonischemic cardiomyopathy given no significant CAD on left heart cath 3/22.  She was started on goal-directed medical therapy with metoprolol 25 mg twice daily, losartan 12.5 mg daily Lipitor 40 mg daily.  She was also noted to be febrile during that admission with a temperature of 103.8 and tachycardic with hypoxia.  Blood cultures were positive for staph epidermis and TEE showed no valvular vegetations.  She was treated with IV antibiotics and plan for 2-week course of Ancef 3 times per week with her dialysis at discharge.  Also had microcytic anemia with decline in hemoglobin to 6.5.   Received 1 unit of PRBCs with improvement in hemoglobin to 8-9.  No evidence of melena or hematochezia.  She was started on twice a day PPI.  Most recently admitted on 04/25/2021 with respiratory distress during her dialysis session.  Noted to be hypoxic with sats in the 80s on EMS arrival.  She was initially placed on BiPAP and dialyzed with improvement to be able to wean off O2.  She presented to [facility] long ED on 7/26 with complaints of progressive shortness of breath and chest pain. Developed chest pain during HD session.   Labs on admission showed stable electrolytes, creatinine 7.3, calcium 12.5, BNP greater than 4500, high-sensitivity troponin 91>>98>>127, lactic acid 3.2>>2.9, WBC 5.8, hemoglobin 9.2.  Chest x-ray small bilateral pleural effusions.  CT angio negative for PE, small bilateral pleural effusions along with bilateral pulmonary consolidations concerning for multilobular pneumonia.  She was admitted to internal medicine for further management and transferred to [facility] in order to receive HD.  Started on IV antibiotics with cefepime and vancomycin while in the ED. Cardiology has been consulted in regards to her heart failure.  In talking with patient and family she previously lived in [place] but has been back and forth to the [place] area. Now is living here as of about a month ago. Has not established care with cardiology per family. Has been receiving HD at [facility] area but family does not know about a nephrologist. She has had recurrent admissions from several different hospital systems for CHF exacerbations which seem to improve with HD sessions while  inpatient. She is quite frail and been losing weight recently. No appetite.   Past Medical History:  Diagnosis Date   CHF (congestive heart failure) (HCC)    EF 25%   ESRD (end stage renal disease) ([place])    Hypertension    Renal disorder     Past Surgical History:  Procedure Laterality Date   AV FISTULA PLACEMENT        Home Medications:  Prior to Admission medications   Medication Sig Start Date End Date Taking? Authorizing Provider  aspirin EC 81 MG tablet Take 81 mg by mouth daily. Swallow whole.   Yes [provider]  atorvastatin (LIPITOR) 40 MG tablet Take 40 mg by mouth every evening. 04/20/21  Yes [provider]  lidocaine-prilocaine (EMLA) cream Apply 1 application topically See admin instructions. Apply small amount to access site 1-2 hours before dialysis. 05/02/21  Yes [provider]  losartan (COZAAR) 25 MG tablet Take 12.5 mg by mouth daily. 04/20/21  Yes [provider]  metoprolol tartrate (LOPRESSOR) 25 MG tablet Take 25 mg by mouth 2 (two) times daily. 04/20/21  Yes [provider]  Multiple Vitamin (MULTIVITAMIN) tablet Take 1 tablet by mouth daily.   Yes [provider]  pantoprazole (PROTONIX) 40 MG tablet Take 40 mg by mouth 2 (two) times daily. 04/20/21  Yes [provider]  VELPHORO 500 MG chewable tablet Chew 500 mg by mouth 3 (three) times daily. 05/03/21  Yes [provider]    Inpatient Medications: Scheduled Meds:  atorvastatin  40 mg Oral q1800   cinacalcet  30 mg Oral Q breakfast   feeding supplement (NEPRO CARB STEADY)  237 mL Oral BID BM   heparin  5,000 Units Subcutaneous Q8H   hydrALAZINE  25 mg Oral Q8H   isosorbide mononitrate  30 mg Oral Daily   [START ON 05/09/2021] metoprolol succinate  100 mg Oral Daily   metoprolol tartrate  50 mg Oral BID   multivitamin  1 tablet Oral QHS   Continuous Infusions:  PRN Meds: acetaminophen **OR** acetaminophen, ondansetron **OR** ondansetron (ZOFRAN) IV  Allergies:    Allergies  Allergen Reactions   Lisinopril Swelling    Angioedema    Penicillin G Swelling    Social History:   Social History   Socioeconomic History   Marital status: Widowed    Spouse name: Not on file   Number of children: Not on file   Years of education: Not on file   Highest  education level: Not on file  Occupational History   Not on file  Tobacco Use   Smoking status: Never   Smokeless tobacco: Never  Vaping Use   Vaping Use: Never used  Substance and Sexual Activity   Alcohol use: Never   Drug use: Never   Sexual activity: Not on file  Other Topics Concern   Not on file  Social History Narrative   Not on file   Social Determinants of Health   Financial Resource Strain: Not on file  Food Insecurity: Not on file  Transportation Needs: Not on file  Physical Activity: Not on file  Stress: Not on file  Social Connections: Not on file  Intimate Partner Violence: Not on file    Family History:    Family History  Problem Relation Age of Onset   Kidney disease Mother      ROS:  Please see the history of present illness.   All other ROS reviewed and negative.  Physical Exam/Data:   Vitals:   05/08/21 0740 05/08/21 0819 05/08/21 1118 05/08/21 1207  BP: (!) 136/91 (!) 142/85 135/82 138/81  Pulse: 98 98 93 97  Resp: (!) 22 (!) 22  20  Temp:  97.6 F (36.4 C)  97.7 F (36.5 C)  TempSrc:  Oral  Oral  SpO2: 95% 100%  97%  Weight:  48.8 kg    Height:  5\' 8"  (1.727 m)      Intake/Output Summary (Last 24 hours) at 05/08/2021 1450 Last data filed at 05/08/2021 0915 Gross per 24 hour  Intake 240 ml  Output --  Net 240 ml   Last 3 Weights 05/08/2021 05/07/2021  Weight (lbs) 107 lb 9.4 oz 115 lb  Weight (kg) 48.8 kg 52.164 kg     Body mass index is 16.36 kg/m.  General:  Thin, frail older female HEENT: normal Lymph: no adenopathy Neck: + JVD Endocrine:  No thryomegaly Vascular: No carotid bruits Cardiac:  normal S1, S2; RRR; loud 3/6 systolic murmur at LLUB Lungs:  diminished in bases Abd: soft, nontender, no hepatomegaly  Ext: no edema Musculoskeletal:  No deformities, BUE and BLE strength normal and equal Skin: warm and dry  Neuro:  CNs 2-12 intact, no focal abnormalities noted Psych:  Normal affect   EKG:  The EKG was  personally reviewed and demonstrates: Sinus tachycardia, 143 bpm, nonspecific ST changes.   Relevant CV Studies:  Echo: 04/19/21  SUMMARY  Left ventricular systolic function is severely reduced.  LV ejection fraction = 25-30%.  There is severe global hypokinesis of the left ventricle.  The left ventricle is moderately dilated.  Mild left ventricular hypertrophy  Non-specific thickening of the tricuspid valve. No definite vegetation  (Duke's minor)  There is mild tricuspid regurgitation.  There is mild mitral regurgitation.  No definite valvular vegetations were seen.   -  FINDINGS:  LEFT VENTRICLE  The left ventricle is moderately dilated. Mild left ventricular  hypertrophy. Left ventricular systolic function is severely reduced.  LV ejection fraction = 25-30%. There is severe global hypokinesis of  the left ventricle.   LV WALL MOTION   -  RIGHT VENTRICLE  The right ventricle is normal size. Hypertrabeculated RV. The right  ventricular systolic function is normal.   LEFT ATRIUM  The left atrium is moderately dilated. The left atrial appendage is  normal. No thrombus is detected in the left atrial appendage.   RIGHT ATRIUM  The right atrium is moderately dilated.  -  AORTIC VALVE  The aortic valve is trileaflet. The aortic valve opens well. There is  aortic valve sclerosis. There is no aortic stenosis. There is trace  aortic regurgitation. There is no aortic valvular vegetation.  -  MITRAL VALVE  The mitral valve leaflets appear normal. There is mild mitral  regurgitation. No significant mitral valve stenosis. There is no  vegetation seen on the mitral valve. Subtle Lambl's excresences.  -  TRICUSPID VALVE  Non-specific thickening of the tricuspid valve. No definite vegetation  (Duke's minor). There is mild tricuspid regurgitation.  -  PULMONIC VALVE  Structurally normal pulmonic valve. Trace pulmonic valvular  regurgitation. There is no pulmonic valvular  stenosis. There is no  vegetation on the pulmonic valve.  -  ARTERIES  The ascending aorta is normal size.  -  VENOUS  Pulmonary venous flow pattern is normal.  -  EFFUSION  There is trivial pericardial effusion.  -  -  -  MISC  No definite valvular vegetations  were seen.    Laboratory Data:  High Sensitivity Troponin:   Recent Labs  Lab 05/07/21 1656 05/07/21 2330 05/08/21 0831  TROPONINIHS 91* 98* 127*     Chemistry Recent Labs  Lab 05/07/21 1656 05/08/21 0831  NA 140 137  K 4.5 5.1  CL 99 98  CO2 27 28  GLUCOSE 95 85  BUN 19 22  CREATININE 7.38* 8.08*  CALCIUM 12.5* 11.6*  GFRNONAA 6* 5*  ANIONGAP 14 11    No results for input(s): PROT, ALBUMIN, AST, ALT, ALKPHOS, BILITOT in the last 168 hours. Hematology Recent Labs  Lab 05/07/21 1851 05/08/21 0831  WBC 5.8 4.7  RBC 2.77* 2.69*  HGB 9.2* 8.7*  HCT 29.6* 27.8*  MCV 106.9* 103.3*  MCH 33.2 32.3  MCHC 31.1 31.3  RDW 16.0* 15.8*  PLT 185 165   BNP Recent Labs  Lab 05/07/21 1656  BNP >4,500.0*    DDimer No results for input(s): DDIMER in the last 168 hours.   Radiology/Studies:  DG Chest 2 View  Result Date: 05/07/2021 CLINICAL DATA:  Chest pain since yesterday. EXAM: CHEST - 2 VIEW COMPARISON:  Single-view of the chest earlier today and 04/25/2021. FINDINGS: Right basilar airspace disease is worse than on the study earlier today. Left basilar airspace disease appears unchanged. There small bilateral pleural effusions. No pneumothorax. Cardiomegaly. IMPRESSION: Right basilar airspace disease is worse on the study earlier today could be due to increased atelectasis or pneumonia. Left basilar airspace disease is unchanged. Small bilateral pleural effusions are unchanged. Cardiomegaly. Electronically Signed   By: Inge Rise M.D.   On: 05/07/2021 18:55   CT Angio Chest PE W/Cm &/Or Wo Cm  Result Date: 05/07/2021 CLINICAL DATA:  60 year old female with concern for pulmonary embolism. EXAM:  CT ANGIOGRAPHY CHEST WITH CONTRAST TECHNIQUE: Multidetector CT imaging of the chest was performed using the standard protocol during bolus administration of intravenous contrast. Multiplanar CT image reconstructions and MIPs were obtained to evaluate the vascular anatomy. CONTRAST:  33mL OMNIPAQUE IOHEXOL 350 MG/ML SOLN COMPARISON:  Chest radiograph dated 05/07/2021. FINDINGS: Cardiovascular: There is cardiomegaly. No pericardial effusion. Mild atherosclerotic calcification of the thoracic aorta. Evaluation of the aorta is limited due to suboptimal opacification and streak artifact caused by patient's arms. Evaluation of the pulmonary arteries is limited due to respiratory motion artifact as well as streak artifact caused by patient's arms and suboptimal visualization of the peripheral branches. No pulmonary artery embolus identified. Mediastinum/Nodes: Right hilar adenopathy measures 2 cm. Evaluation of lymph node is very limited due to consolidative changes of the lungs. The esophagus is grossly unremarkable. No mediastinal fluid collection. Lungs/Pleura: Small bilateral pleural effusions. There is patchy areas of consolidative changes primarily involving the lower lobes. Patchy left upper lobe as well as diffuse ground-glass density in the right upper lobe noted. Findings concerning for multi lobar pneumonia. Clinical correlation and follow-up to resolution recommended. No pneumothorax. The central airways are patent. Upper Abdomen: Bilateral renal atrophy. Musculoskeletal: There is diffuse subcutaneous edema and anasarca. There is osteosclerosis consistent with renal osteodystrophy. No acute osseous pathology. Review of the MIP images confirms the above findings. IMPRESSION: 1. No CT evidence of pulmonary embolism. 2. Cardiomegaly with findings of CHF and small bilateral pleural effusions. 3. Bilateral pulmonary consolidations concerning for multilobar pneumonia. Clinical correlation and follow-up to resolution  recommended. 4. Aortic Atherosclerosis (ICD10-I70.0). Electronically Signed   By: Anner Crete M.D.   On: 05/07/2021 20:54     Assessment and Plan:   Randolm Idol  is a 60 y.o. female with a hx of ESRD on HD (MWF), Chronic systolic HF (85%), HLD, HTN, GERD who is being seen 05/08/2021 for the evaluation of CHF at the request of Dr. Starla Link.  Acute on chronic combined CHF: Echo from 04/19/21 (care everywhere) showed an EF of 25% with global hypokinesis. Recent cath 3/22 with non significant coronary disease. She has been on metoprolol and losartan PTA. Has had recurrent admission for CHF exacerbations over the past couple of months at several hospitals. Admission this time is similar to prior with chest pain during HD session. CXR/CT chest with bilateral small pleural effusions on admission.  -- GDMT is limited in the setting of ESRD, would recommend consolidating to Toprol XL 100mg  daily. She had angioedema with lisinopril in the past, therefore would avoid ACEi/ARB/entresto -- consider addition of hydralazine/nitrate combo if blood pressures can tolerate -- volume management per HD  Chest pain with mildly elevated troponin: troponin with low flat trend in the setting of renal disease. Recent work up with stress test and cath back 3/22 with no significant coronary disease. Chest pain is mostly with HD or eating.  -- continue ASA, statin, BB  ESRD on HD: reports having been on HD since 2002. Recently moved to this area and now established on a TTS schedule.  -- per nephrology  HTN: blood pressures have been elevated this admission.  -- as above would transition to Toprol, with addition of hydralazine/nitrate combo   HLD: home med list atorva 40mg  daily -- check lipids in am  Murmur: prominent systolic murmur on exam with no significant valvular disease noted on echo from 7/8 -- repeat echo  Risk Assessment/Risk Scores:   HEAR Score (for undifferentiated chest pain):  HEAR Score:  3{   New York Heart Association (NYHA) Functional Class NYHA Class II   For questions or updates, please contact [facility] Please consult [email or web address] for contact info under    Signed, Reino Bellis, NP  05/08/2021 2:50 PM  I have seen and examined the patient along with Reino Bellis, NP.  I have reviewed the chart, notes and new data.  I agree with PA/NP's note.  Key new complaints: breathing much improved, denies angina, palpitations or hypotension/syncope during or after dialysis Key examination changes: RRR, RLSB murmur is systolic and may be TR or even transmitted bruit from her large R arm fistula (excellent thrill and bruit). Mild JVD. S3 Gallop present. HTN and marked tachycardia on admission, both rapidly improving after dialysis. Appears pale, chronically ill Key new findings / data: Hgb 8.7, BNP >4500, very mild and adynamic TnI increase c/w HF exacerbation  Echo today   1. Left ventricular ejection fraction, by estimation, is 25 to 30%. The  left ventricle has severely decreased function. The left ventricle  demonstrates global hypokinesis. There is mild left ventricular  hypertrophy. Left ventricular diastolic parameters   are consistent with Grade III diastolic dysfunction (restrictive).  Elevated left atrial pressure.   2. Right ventricular systolic function is normal. The right ventricular  size is mildly enlarged. There is moderately elevated pulmonary artery  systolic pressure. The estimated right ventricular systolic pressure is  88.5 mmHg.   3. Left atrial size was moderately dilated.   4. Right atrial size was mildly dilated.   5. A small pericardial effusion is present.   6. The mitral valve is normal in structure. Mild to moderate mitral valve  regurgitation.   7. Tricuspid valve regurgitation is mild to moderate.  8. The aortic valve is tricuspid. Aortic valve regurgitation is not  visualized. Mild aortic valve sclerosis is present, with no  evidence of  aortic valve stenosis.   9. The inferior vena cava is dilated in size with >50% respiratory  variability, suggesting right atrial pressure of 8 mmHg.   FINDINGS   Left Ventricle: Left ventricular ejection fraction, by estimation, is 25  to 30%. The left ventricle has severely decreased function. The left  ventricle demonstrates global hypokinesis. The left ventricular internal  cavity size was normal in size. There  is mild left ventricular hypertrophy. Left ventricular diastolic  parameters are consistent with Grade III diastolic dysfunction  (restrictive). Elevated left atrial pressure.   Right Ventricle: The right ventricular size is mildly enlarged. No  increase in right ventricular wall thickness. Right ventricular systolic  function is normal. There is moderately elevated pulmonary artery systolic  pressure. The tricuspid regurgitant  velocity is 3.07 m/s, and with an assumed right atrial pressure of 8 mmHg,  the estimated right ventricular systolic pressure is 46.9 mmHg.   Left Atrium: Left atrial size was moderately dilated.   Right Atrium: Right atrial size was mildly dilated.   Pericardium: A small pericardial effusion is present.   Mitral Valve: The mitral valve is normal in structure. Mild to moderate  mitral valve regurgitation.   Tricuspid Valve: The tricuspid valve is normal in structure. Tricuspid  valve regurgitation is mild to moderate.   Aortic Valve: The aortic valve is tricuspid. Aortic valve regurgitation is  not visualized. Mild aortic valve sclerosis is present, with no evidence  of aortic valve stenosis.   Pulmonic Valve: The pulmonic valve was not well visualized. Pulmonic valve  regurgitation is mild.   Aorta: The aortic root and ascending aorta are structurally normal, with  no evidence of dilitation.   Venous: The inferior vena cava is dilated in size with greater than 50%  respiratory variability, suggesting right atrial pressure  of 8 mmHg.   IAS/Shunts: The interatrial septum was not well visualized.   PLAN: Echo w global LV hypokinesis, 4-chamber enlargement. MR and TR appear most likely to be secondary. C/W nonischemic dilated CMP. Remains volume overloaded. She presented w acute pulmonary edema after longer weekend pause in HD, on 2 separate occasions. Echo continues to show signs of markedly elevated left heart filling pressures this afternoon. I think we have to aggressively challenge her dry weight. Cannot use RAAS inhibitors due to history of angioedema w ACEi. May benefit from hydralazine-nitrates for HF. Continue metoprolol succinate.  Sanda Klein, MD, [place] [phone] 05/08/2021, 4:47 PM

## 2021-05-08 NOTE — Consult Note (Signed)
Renal Service Consult Note [facility]  Cynthia Bray 05/08/2021 Cynthia Blazing, MD Requesting Physician: Dr Cynthia Bray  Reason for Consult: ESRD pt w/ SOB HPI: The patient is a 60 y.o. year-old w/ hx of ESRD on HD, CHF w/ EF 25%, HTN who presented to ED for having chest pain while on dialysis. HD was stopped half way through the session. Pt's SOB worsening x 24 hrs approx.  In eD RR 30's, BP's up a bit, HR to 140, BNP >4500, CXR showed IS edema and LL consolidation bilat, CT chest showed basically the same. Started on HD, asked to see for ESRD.    Pt seen in room.  Not in distress, no prod cough, no leg swelling.  On HD since 2002 (20 yrs).      ROS - denies CP, no joint pain, no HA, no blurry vision, no rash, no diarrhea, no nausea/ vomiting, no dysuria, no difficulty voiding   Past Medical History  Past Medical History:  Diagnosis Date   CHF (congestive heart failure) (HCC)    EF 25%   ESRD (end stage renal disease) ([place])    Hypertension    Renal disorder    Past Surgical History  Past Surgical History:  Procedure Laterality Date   AV FISTULA PLACEMENT     Family History  Family History  Problem Relation Age of Onset   Kidney disease Mother    Social History  reports that she has never smoked. She has never used smokeless tobacco. She reports that she does not drink alcohol and does not use drugs. Allergies  Allergies  Allergen Reactions   Lisinopril Swelling    Angioedema    Penicillin G Swelling   Home medications Prior to Admission medications   Medication Sig Start Date End Date Taking? Authorizing Provider  aspirin EC 81 MG tablet Take 81 mg by mouth daily. Swallow whole.   Yes [provider]  atorvastatin (LIPITOR) 40 MG tablet Take 40 mg by mouth every evening. 04/20/21  Yes [provider]  lidocaine-prilocaine (EMLA) cream Apply 1 application topically See admin instructions. Apply small amount to access site 1-2 hours before  dialysis. 05/02/21  Yes [provider]  losartan (COZAAR) 25 MG tablet Take 12.5 mg by mouth daily. 04/20/21  Yes [provider]  metoprolol tartrate (LOPRESSOR) 25 MG tablet Take 25 mg by mouth 2 (two) times daily. 04/20/21  Yes [provider]  Multiple Vitamin (MULTIVITAMIN) tablet Take 1 tablet by mouth daily.   Yes [provider]  pantoprazole (PROTONIX) 40 MG tablet Take 40 mg by mouth 2 (two) times daily. 04/20/21  Yes [provider]  VELPHORO 500 MG chewable tablet Chew 500 mg by mouth 3 (three) times daily. 05/03/21  Yes [provider]     Vitals:   05/08/21 0740 05/08/21 0819 05/08/21 1118 05/08/21 1207  BP: (!) 136/91 (!) 142/85 135/82 138/81  Pulse: 98 98 93 97  Resp: (!) 22 (!) 22  20  Temp:  97.6 F (36.4 C)  97.7 F (36.5 C)  TempSrc:  Oral  Oral  SpO2: 95% 100%  97%  Weight:  48.8 kg    Height:  5\' 8"  (1.727 m)     Exam Gen alert, no distress, nasal o2 No rash, cyanosis or gangrene Sclera anicteric, throat clear  No jvd or bruits Chest mild rales at bases, no rales/ wheezing RRR no MRG Abd soft ntnd no mass or ascites +bs GU defer MS no  joint effusions or deformity Ext no LE edema, 1+ RUE swelling, no wounds or ulcers Neuro is alert, Ox 3 , nf  RUA AVF+Bruit     Home meds  include lipitor, asa, Cozaar, lopressor 25 bid, protonix, velphoro ac tid    Na 137  K 5.1  BUN 22  Cr 8  Ca 11.6   OP HD: [facility] TTS    3.5h  350/800  48kg  2/2 bath  Hep 2000 RUA AVF  - sensipar 30 tiw  - mircera 75 q2 last 5/11    Assessment/ Plan: Acute on chronic resp failure: d/t pulm edema w/ bilat effusions. Plan for HD later today or tonight.  Hypercalcemia - not on any Ca containing supps or binders, was on VDRA but stopped in June due to high Ca. PTH recently was high at 790 and sensipar was resumed recently w/ HD tiw. Ca levels have been mostly high in the 10- 11 range for the last couple of years.  ESRD - on HD TTS.  HD  today or possible delay, at the latest in the morning tomorrow. Low Ca++ bath if possible.  HTN - let BP's run a bit high while trying to lower volume on this patient MBD ckd - see above about Ca, cont velphoro as binder and sensipar w/ hd Anemia ckd - Hb 8- 9 here, will give esa darbe 60 ug weekly      Cynthia Splinter  MD 05/08/2021, 2:37 PM  Recent Labs  Lab 05/07/21 1851 05/08/21 0831  WBC 5.8 4.7  HGB 9.2* 8.7*   Recent Labs  Lab 05/07/21 1656 05/08/21 0831  K 4.5 5.1  BUN 19 22  CREATININE 7.38* 8.08*  CALCIUM 12.5* 11.6*

## 2021-05-08 NOTE — Progress Notes (Signed)
Date and time results received: 05/08/21 1002  Test: Troponnins Critical Value: 127  Name of Provider Notified: Dr. Starla Link   Orders Received? Or Actions Taken?: Awaiting orders. Dr. Starla Link returned called [phone] advised Cardiology will be seeing patient later today.

## 2021-05-08 NOTE — Progress Notes (Signed)
Initial Nutrition Assessment  DOCUMENTATION CODES:   Underweight, Severe malnutrition in context of chronic illness  INTERVENTION:   - Nepro Shake po BID, each supplement provides 425 kcal and 19 grams protein  - Magic Cup BID with meals, each supplement provides 290 kcal and 9 grams of protein  - Liberalize diet to Regular with 1200 ml fluid restriction, verbal with readback order placed per Dr. Jonnie Finner  - Recommend renal MVI daily  NUTRITION DIAGNOSIS:   Severe Malnutrition related to chronic illness (CHF, ESRD) as evidenced by severe fat depletion, severe muscle depletion.  GOAL:   Patient will meet greater than or equal to 90% of their needs  MONITOR:   PO intake, Supplement acceptance, Labs, Weight trends, I & O's  REASON FOR ASSESSMENT:   Malnutrition Screening Tool    ASSESSMENT:   60 year old female who presented to the ED on 7/26 with chest pain and SOB. PMH of ESRD on HD, CHF, HTN.  Spoke with pt at bedside. Pt sleepy at time of visit but able to engage in conversation. Pt reports decreased appetite for "a while." Pt suspects her appetite started to decrease about 6 months ago. She is not exactly sure what might have caused her appetite to decrease. Pt reports that she does not eat full meals but rather eats "some of this, some of that" throughout the day.  Noted breakfast meal tray at bedside. Pt had consumed <10% of her breakfast meal. Pt states that "I'm just not hungry."  Pt endorses weight loss that she believes began a few years after she started HD in 2002. Pt reports that she has had to have her dry weight lowered multiple times but isn't sure of her current dry weight. No weight history available in chart.  Pt states that "they have been giving me Nepro recently." Pt likes the vanilla and chocolate flavors. RD will order Nepro BID during admission. Pt also interested in trying Magic Cups with meals. Discussed diet liberalization with Nephrologist who  agreed. Verbal with readback diet order placed for Regular diet with 1200 ml fluid restriction.  Medications reviewed and include: sensipar  Labs reviewed: calcium 11.6, lactic acid 2.9, hemoglobin 9.2  NUTRITION - FOCUSED PHYSICAL EXAM:  Flowsheet Row Most Recent Value  Orbital Region Severe depletion  Upper Arm Region Moderate depletion  Thoracic and Lumbar Region Severe depletion  Buccal Region Moderate depletion  Temple Region Moderate depletion  Clavicle Bone Region Severe depletion  Clavicle and Acromion Bone Region Severe depletion  Scapular Bone Region Severe depletion  Dorsal Hand Severe depletion  Patellar Region Severe depletion  Anterior Thigh Region Severe depletion  Posterior Calf Region Severe depletion  Edema (RD Assessment) None  Hair Reviewed  Eyes Reviewed  Mouth Reviewed  Skin Reviewed  Nails Reviewed       Diet Order:   Diet Order             Diet regular Room service appropriate? Yes; Fluid consistency: Thin; Fluid restriction: 1200 mL Fluid  Diet effective now                   EDUCATION NEEDS:   Education needs have been addressed  Skin:  Skin Assessment: Reviewed RN Assessment  Last BM:  no documented BM  Height:   Ht Readings from Last 1 Encounters:  05/08/21 5\' 8"  (1.727 m)    Weight:   Wt Readings from Last 1 Encounters:  05/08/21 48.8 kg    BMI:  Body mass index  is 16.36 kg/m.  Estimated Nutritional Needs:   Kcal:  1500-1700  Protein:  70-85 grams  Fluid:  1000 ml + UOP    Gustavus Bryant, MS, RD, LDN Inpatient Clinical Dietitian Please see [facility] for contact information.

## 2021-05-09 DIAGNOSIS — M898X9 Other specified disorders of bone, unspecified site: Secondary | ICD-10-CM | POA: Diagnosis present

## 2021-05-09 DIAGNOSIS — J9601 Acute respiratory failure with hypoxia: Secondary | ICD-10-CM | POA: Diagnosis present

## 2021-05-09 DIAGNOSIS — Z992 Dependence on renal dialysis: Secondary | ICD-10-CM | POA: Diagnosis not present

## 2021-05-09 DIAGNOSIS — E43 Unspecified severe protein-calorie malnutrition: Secondary | ICD-10-CM | POA: Insufficient documentation

## 2021-05-09 DIAGNOSIS — D7589 Other specified diseases of blood and blood-forming organs: Secondary | ICD-10-CM | POA: Diagnosis present

## 2021-05-09 DIAGNOSIS — I5043 Acute on chronic combined systolic (congestive) and diastolic (congestive) heart failure: Secondary | ICD-10-CM | POA: Diagnosis present

## 2021-05-09 DIAGNOSIS — Z880 Allergy status to penicillin: Secondary | ICD-10-CM | POA: Diagnosis not present

## 2021-05-09 DIAGNOSIS — Z79899 Other long term (current) drug therapy: Secondary | ICD-10-CM | POA: Diagnosis not present

## 2021-05-09 DIAGNOSIS — Z681 Body mass index (BMI) 19 or less, adult: Secondary | ICD-10-CM | POA: Diagnosis not present

## 2021-05-09 DIAGNOSIS — I5023 Acute on chronic systolic (congestive) heart failure: Secondary | ICD-10-CM | POA: Diagnosis not present

## 2021-05-09 DIAGNOSIS — D631 Anemia in chronic kidney disease: Secondary | ICD-10-CM | POA: Diagnosis present

## 2021-05-09 DIAGNOSIS — I132 Hypertensive heart and chronic kidney disease with heart failure and with stage 5 chronic kidney disease, or end stage renal disease: Secondary | ICD-10-CM | POA: Diagnosis present

## 2021-05-09 DIAGNOSIS — Z20822 Contact with and (suspected) exposure to covid-19: Secondary | ICD-10-CM | POA: Diagnosis present

## 2021-05-09 DIAGNOSIS — J9621 Acute and chronic respiratory failure with hypoxia: Secondary | ICD-10-CM | POA: Diagnosis present

## 2021-05-09 DIAGNOSIS — J811 Chronic pulmonary edema: Secondary | ICD-10-CM | POA: Diagnosis present

## 2021-05-09 DIAGNOSIS — Z841 Family history of disorders of kidney and ureter: Secondary | ICD-10-CM | POA: Diagnosis not present

## 2021-05-09 DIAGNOSIS — E872 Acidosis: Secondary | ICD-10-CM | POA: Diagnosis present

## 2021-05-09 DIAGNOSIS — I248 Other forms of acute ischemic heart disease: Secondary | ICD-10-CM | POA: Diagnosis present

## 2021-05-09 DIAGNOSIS — E785 Hyperlipidemia, unspecified: Secondary | ICD-10-CM | POA: Diagnosis present

## 2021-05-09 DIAGNOSIS — Z888 Allergy status to other drugs, medicaments and biological substances status: Secondary | ICD-10-CM | POA: Diagnosis not present

## 2021-05-09 DIAGNOSIS — J810 Acute pulmonary edema: Secondary | ICD-10-CM | POA: Diagnosis not present

## 2021-05-09 DIAGNOSIS — K219 Gastro-esophageal reflux disease without esophagitis: Secondary | ICD-10-CM | POA: Diagnosis present

## 2021-05-09 DIAGNOSIS — N186 End stage renal disease: Secondary | ICD-10-CM | POA: Diagnosis present

## 2021-05-09 DIAGNOSIS — Z7982 Long term (current) use of aspirin: Secondary | ICD-10-CM | POA: Diagnosis not present

## 2021-05-09 LAB — BASIC METABOLIC PANEL
Anion gap: 6 (ref 5–15)
BUN: 7 mg/dL — ABNORMAL LOW (ref 8–23)
CO2: 31 mmol/L (ref 22–32)
Calcium: 10.3 mg/dL (ref 8.9–10.3)
Chloride: 96 mmol/L — ABNORMAL LOW (ref 98–111)
Creatinine, Ser: 3.64 mg/dL — ABNORMAL HIGH (ref 0.44–1.00)
GFR, Estimated: 14 mL/min — ABNORMAL LOW (ref >60)
Glucose, Bld: 106 mg/dL — ABNORMAL HIGH (ref 70–99)
Potassium: 3.2 mmol/L — ABNORMAL LOW (ref 3.5–5.1)
Sodium: 133 mmol/L — ABNORMAL LOW (ref 135–145)

## 2021-05-09 LAB — CBC WITH DIFFERENTIAL/PLATELET
Abs Immature Granulocytes: 0.01 10*3/uL (ref 0.00–0.07)
Basophils Absolute: 0 10*3/uL (ref 0.0–0.1)
Basophils Relative: 1 %
Eosinophils Absolute: 0.1 10*3/uL (ref 0.0–0.5)
Eosinophils Relative: 3 %
HCT: 26.1 % — ABNORMAL LOW (ref 36.0–46.0)
Hemoglobin: 8.4 g/dL — ABNORMAL LOW (ref 12.0–15.0)
Immature Granulocytes: 0 %
Lymphocytes Relative: 21 %
Lymphs Abs: 1 10*3/uL (ref 0.7–4.0)
MCH: 33.1 pg (ref 26.0–34.0)
MCHC: 32.2 g/dL (ref 30.0–36.0)
MCV: 102.8 fL — ABNORMAL HIGH (ref 80.0–100.0)
Monocytes Absolute: 0.5 10*3/uL (ref 0.1–1.0)
Monocytes Relative: 10 %
Neutro Abs: 3.2 10*3/uL (ref 1.7–7.7)
Neutrophils Relative %: 65 %
Platelets: 176 10*3/uL (ref 150–400)
RBC: 2.54 MIL/uL — ABNORMAL LOW (ref 3.87–5.11)
RDW: 15.6 % — ABNORMAL HIGH (ref 11.5–15.5)
WBC: 4.9 10*3/uL (ref 4.0–10.5)
nRBC: 0 % (ref 0.0–0.2)

## 2021-05-09 LAB — MAGNESIUM: Magnesium: 1.8 mg/dL (ref 1.7–2.4)

## 2021-05-09 LAB — HEPATITIS B CORE ANTIBODY, TOTAL: Hep B Core Total Ab: NONREACTIVE

## 2021-05-09 LAB — HEPATITIS B SURFACE ANTIGEN: Hepatitis B Surface Ag: NONREACTIVE

## 2021-05-09 MED ORDER — HEPARIN SODIUM (PORCINE) 1000 UNIT/ML DIALYSIS
2000.0000 [IU] | Freq: Once | INTRAMUSCULAR | Status: AC
  Administered 2021-05-09: 2000 [IU] via INTRAVENOUS_CENTRAL

## 2021-05-09 NOTE — Progress Notes (Signed)
[facility] Progress Note  Subjective: seen in room, feels fine, no c/o's, extremely vague in her answers  Vitals:   05/09/21 0501 05/09/21 0749 05/09/21 1123 05/09/21 1300  BP: 107/84 (!) 108/59 129/63   Pulse:  88 84 81  Resp:  (!) 22 (!) 23 (!) 22  Temp:  97.9 F (36.6 C)    TempSrc:  Oral    SpO2:  90% 98% 100%  Weight:      Height:        Exam:  alert, nad   no jvd  Chest cta bilat  Cor reg no RG  Abd soft ntnd no ascites   Ext no LE edema   Alert, NF, ox3   RUA AVF+bruit     Home meds  include lipitor, asa, Cozaar, lopressor 25 bid, protonix, velphoro ac tid      OP HD: [facility] TTS   3.5h  350/800  48kg  2/2 bath  Hep 2000 RUA AVF  - sensipar 30 tiw  - mircera 75 q2 last 5/11       Assessment/ Plan: Acute on chronic resp failure: d/t pulm edema, vol overload. HD again today HTN/ volume - vol overload w/ pleural effusions/ pulm edema. HD again today, lower vol further and then repeat CXR. Losing body wt.  Hypercalcemia - ca 10.3 today, better. Her VDRA was stopped in June due to high Ca. PTH recently was high at 790 and sensipar was resumed recently w/ HD tiw. Ca levels have been mostly high in the 10- 11 range for the last couple of years. ESRD - on HD TTS.  Had HD here yest w/ 2.7 L off, is 1.5 kg under today. Plan HD today w/ goal 2 L UF.  HTN - let BP's run a bit high while trying to lower volume on this patient MBD ckd - see above about Ca, cont velphoro as binder and sensipar w/ hd Anemia ckd - Hb 8- 9 here, will give esa darbe 60 ug weekly     Rob Maley Venezia 05/09/2021, 1:37 PM   Recent Labs  Lab 05/08/21 0831 05/09/21 0051  K 5.1 3.2*  BUN 22 7*  CREATININE 8.08* 3.64*  CALCIUM 11.6* 10.3  HGB 8.7* 8.4*   Inpatient medications:  atorvastatin  40 mg Oral q1800   Chlorhexidine Gluconate Cloth  6 each Topical Q0600   cinacalcet  30 mg Oral Q breakfast   cinacalcet  30 mg Oral Q T,Th,Sa-HD   [START ON 05/15/2021] darbepoetin (ARANESP)  injection - DIALYSIS  60 mcg Intravenous Q Wed-HD   feeding supplement (NEPRO CARB STEADY)  237 mL Oral BID BM   heparin  5,000 Units Subcutaneous Q8H   hydrALAZINE  25 mg Oral Q8H   isosorbide mononitrate  30 mg Oral Daily   metoprolol succinate  100 mg Oral Daily   multivitamin  1 tablet Oral QHS    acetaminophen **OR** acetaminophen, ondansetron **OR** ondansetron (ZOFRAN) IV

## 2021-05-09 NOTE — Progress Notes (Signed)
Out Patient Arrangements:  Pt is est @ [facility] on their TTS shift w/ a time of 11:45 am.   Thanks Linus Orn [facility] [phone]

## 2021-05-09 NOTE — Progress Notes (Signed)
Progress Note  Patient Name: Cynthia Bray Date of Encounter: 05/09/2021  [facility] Cardiologist: None    Subjective   Feels well.  Able to lie completely supine in bed without any dyspnea or chest discomfort. Echocardiogram performed at 4 PM last night still showed evidence of substantial fluid overload/elevated mean left atrial pressure and elevated right atrial pressure. Weight is down 3.3 kg since dialysis.  Inpatient Medications    Scheduled Meds:  atorvastatin  40 mg Oral q1800   Chlorhexidine Gluconate Cloth  6 each Topical Q0600   cinacalcet  30 mg Oral Q breakfast   cinacalcet  30 mg Oral Q T,Th,Sa-HD   [START ON 05/15/2021] darbepoetin (ARANESP) injection - DIALYSIS  60 mcg Intravenous Q Wed-HD   feeding supplement (NEPRO CARB STEADY)  237 mL Oral BID BM   heparin  5,000 Units Subcutaneous Q8H   hydrALAZINE  25 mg Oral Q8H   isosorbide mononitrate  30 mg Oral Daily   metoprolol succinate  100 mg Oral Daily   multivitamin  1 tablet Oral QHS   Continuous Infusions:  PRN Meds: acetaminophen **OR** acetaminophen, ondansetron **OR** ondansetron (ZOFRAN) IV   Vital Signs    Vitals:   05/09/21 0041 05/09/21 0410 05/09/21 0501 05/09/21 0749  BP:   107/84 (!) 108/59  Pulse:    88  Resp:    (!) 22  Temp:    97.9 F (36.6 C)  TempSrc:    Oral  SpO2:    90%  Weight: 46.6 kg 46.6 kg    Height:        Intake/Output Summary (Last 24 hours) at 05/09/2021 0819 Last data filed at 05/09/2021 0805 Gross per 24 hour  Intake 720 ml  Output --  Net 720 ml   Last 3 Weights 05/09/2021 05/09/2021 05/08/2021  Weight (lbs) 102 lb 12.8 oz 102 lb 12.8 oz 104 lb 11.5 oz  Weight (kg) 46.63 kg 46.63 kg 47.5 kg      Telemetry    Sinus rhythm- Personally Reviewed  ECG    No new tracing- Personally Reviewed  Physical Exam  Underweight/borderline cachectic GEN: No acute distress.   Neck: No JVD Cardiac: RRR, 2/6 holosystolic murmur is heard at the left lower sternal  border and at the apex, no diastolic murmurs, rubs, or gallops.  Large AV fistula right upper arm with excellent thrill and bruit. Respiratory: Clear to auscultation bilaterally. GI: Soft, nontender, non-distended  MS: No edema; No deformity. Neuro:  Nonfocal  Psych: Normal affect   Labs    High Sensitivity Troponin:   Recent Labs  Lab 05/07/21 1656 05/07/21 2330 05/08/21 0831  TROPONINIHS 91* 98* 127*      Chemistry Recent Labs  Lab 05/07/21 1656 05/08/21 0831 05/09/21 0051  NA 140 137 133*  K 4.5 5.1 3.2*  CL 99 98 96*  CO2 27 28 31   GLUCOSE 95 85 106*  BUN 19 22 7*  CREATININE 7.38* 8.08* 3.64*  CALCIUM 12.5* 11.6* 10.3  GFRNONAA 6* 5* 14*  ANIONGAP 14 11 6      Hematology Recent Labs  Lab 05/07/21 1851 05/08/21 0831 05/09/21 0051  WBC 5.8 4.7 4.9  RBC 2.77* 2.69* 2.54*  HGB 9.2* 8.7* 8.4*  HCT 29.6* 27.8* 26.1*  MCV 106.9* 103.3* 102.8*  MCH 33.2 32.3 33.1  MCHC 31.1 31.3 32.2  RDW 16.0* 15.8* 15.6*  PLT 185 165 176    BNP Recent Labs  Lab 05/07/21 1656  BNP >4,500.0*  DDimer No results for input(s): DDIMER in the last 168 hours.   Radiology    DG Chest 2 View  Result Date: 05/07/2021 CLINICAL DATA:  Chest pain since yesterday. EXAM: CHEST - 2 VIEW COMPARISON:  Single-view of the chest earlier today and 04/25/2021. FINDINGS: Right basilar airspace disease is worse than on the study earlier today. Left basilar airspace disease appears unchanged. There small bilateral pleural effusions. No pneumothorax. Cardiomegaly. IMPRESSION: Right basilar airspace disease is worse on the study earlier today could be due to increased atelectasis or pneumonia. Left basilar airspace disease is unchanged. Small bilateral pleural effusions are unchanged. Cardiomegaly. Electronically Signed   By: Inge Rise M.D.   On: 05/07/2021 18:55   CT Angio Chest PE W/Cm &/Or Wo Cm  Result Date: 05/07/2021 CLINICAL DATA:  60 year old female with concern for pulmonary  embolism. EXAM: CT ANGIOGRAPHY CHEST WITH CONTRAST TECHNIQUE: Multidetector CT imaging of the chest was performed using the standard protocol during bolus administration of intravenous contrast. Multiplanar CT image reconstructions and MIPs were obtained to evaluate the vascular anatomy. CONTRAST:  49mL OMNIPAQUE IOHEXOL 350 MG/ML SOLN COMPARISON:  Chest radiograph dated 05/07/2021. FINDINGS: Cardiovascular: There is cardiomegaly. No pericardial effusion. Mild atherosclerotic calcification of the thoracic aorta. Evaluation of the aorta is limited due to suboptimal opacification and streak artifact caused by patient's arms. Evaluation of the pulmonary arteries is limited due to respiratory motion artifact as well as streak artifact caused by patient's arms and suboptimal visualization of the peripheral branches. No pulmonary artery embolus identified. Mediastinum/Nodes: Right hilar adenopathy measures 2 cm. Evaluation of lymph node is very limited due to consolidative changes of the lungs. The esophagus is grossly unremarkable. No mediastinal fluid collection. Lungs/Pleura: Small bilateral pleural effusions. There is patchy areas of consolidative changes primarily involving the lower lobes. Patchy left upper lobe as well as diffuse ground-glass density in the right upper lobe noted. Findings concerning for multi lobar pneumonia. Clinical correlation and follow-up to resolution recommended. No pneumothorax. The central airways are patent. Upper Abdomen: Bilateral renal atrophy. Musculoskeletal: There is diffuse subcutaneous edema and anasarca. There is osteosclerosis consistent with renal osteodystrophy. No acute osseous pathology. Review of the MIP images confirms the above findings. IMPRESSION: 1. No CT evidence of pulmonary embolism. 2. Cardiomegaly with findings of CHF and small bilateral pleural effusions. 3. Bilateral pulmonary consolidations concerning for multilobar pneumonia. Clinical correlation and  follow-up to resolution recommended. 4. Aortic Atherosclerosis (ICD10-I70.0). Electronically Signed   By: Anner Crete M.D.   On: 05/07/2021 20:54   ECHOCARDIOGRAM COMPLETE  Result Date: 05/08/2021    ECHOCARDIOGRAM REPORT   Patient Name:   Cynthia Bray Date of Exam: 05/08/2021 Medical Rec #:  [id number]     Height:       68.0 in Accession #:    [id number]    Weight:       107.6 lb Date of Birth:  November 08, 1960    BSA:          1.570 m Patient Age:    60 years      BP:           124/78 mmHg Patient Gender: F             HR:           81 bpm. Exam Location:  Inpatient Procedure: 2D Echo, Cardiac Doppler and Color Doppler Indications:    Murmur  History:        Patient has no prior history of Echocardiogram examinations.  CHF; Risk Factors:Hypertension.  Sonographer:    Bernadene Person RDCS Referring Phys: [place]  1. Left ventricular ejection fraction, by estimation, is 25 to 30%. The left ventricle has severely decreased function. The left ventricle demonstrates global hypokinesis. There is mild left ventricular hypertrophy. Left ventricular diastolic parameters  are consistent with Grade III diastolic dysfunction (restrictive). Elevated left atrial pressure.  2. Right ventricular systolic function is normal. The right ventricular size is mildly enlarged. There is moderately elevated pulmonary artery systolic pressure. The estimated right ventricular systolic pressure is 33.0 mmHg.  3. Left atrial size was moderately dilated.  4. Right atrial size was mildly dilated.  5. A small pericardial effusion is present.  6. The mitral valve is normal in structure. Mild to moderate mitral valve regurgitation.  7. Tricuspid valve regurgitation is mild to moderate.  8. The aortic valve is tricuspid. Aortic valve regurgitation is not visualized. Mild aortic valve sclerosis is present, with no evidence of aortic valve stenosis.  9. The inferior vena cava is dilated in size with >50%  respiratory variability, suggesting right atrial pressure of 8 mmHg. FINDINGS  Left Ventricle: Left ventricular ejection fraction, by estimation, is 25 to 30%. The left ventricle has severely decreased function. The left ventricle demonstrates global hypokinesis. The left ventricular internal cavity size was normal in size. There is mild left ventricular hypertrophy. Left ventricular diastolic parameters are consistent with Grade III diastolic dysfunction (restrictive). Elevated left atrial pressure. Right Ventricle: The right ventricular size is mildly enlarged. No increase in right ventricular wall thickness. Right ventricular systolic function is normal. There is moderately elevated pulmonary artery systolic pressure. The tricuspid regurgitant velocity is 3.07 m/s, and with an assumed right atrial pressure of 8 mmHg, the estimated right ventricular systolic pressure is 07.6 mmHg. Left Atrium: Left atrial size was moderately dilated. Right Atrium: Right atrial size was mildly dilated. Pericardium: A small pericardial effusion is present. Mitral Valve: The mitral valve is normal in structure. Mild to moderate mitral valve regurgitation. Tricuspid Valve: The tricuspid valve is normal in structure. Tricuspid valve regurgitation is mild to moderate. Aortic Valve: The aortic valve is tricuspid. Aortic valve regurgitation is not visualized. Mild aortic valve sclerosis is present, with no evidence of aortic valve stenosis. Pulmonic Valve: The pulmonic valve was not well visualized. Pulmonic valve regurgitation is mild. Aorta: The aortic root and ascending aorta are structurally normal, with no evidence of dilitation. Venous: The inferior vena cava is dilated in size with greater than 50% respiratory variability, suggesting right atrial pressure of 8 mmHg. IAS/Shunts: The interatrial septum was not well visualized.  LEFT VENTRICLE PLAX 2D LVIDd:         5.10 cm      Diastology LVIDs:         4.40 cm      LV e' medial:     3.83 cm/s LV PW:         1.10 cm      LV E/e' medial:  26.6 LV IVS:        0.90 cm      LV e' lateral:   6.70 cm/s LVOT diam:     1.90 cm      LV E/e' lateral: 15.2 LV SV:         44 LV SV Index:   28 LVOT Area:     2.84 cm  LV Volumes (MOD) LV vol d, MOD A2C: 127.0 ml LV vol d, MOD A4C: 112.0 ml  LV vol s, MOD A2C: 86.0 ml LV vol s, MOD A4C: 84.6 ml LV SV MOD A2C:     41.0 ml LV SV MOD A4C:     112.0 ml LV SV MOD BP:      33.3 ml RIGHT VENTRICLE RV S prime:     13.80 cm/s TAPSE (M-mode): 2.1 cm LEFT ATRIUM             Index       RIGHT ATRIUM           Index LA diam:        3.00 cm 1.91 cm/m  RA Area:     18.50 cm LA Vol (A2C):   67.9 ml 43.24 ml/m RA Volume:   52.40 ml  33.37 ml/m LA Vol (A4C):   72.9 ml 46.42 ml/m LA Biplane Vol: 76.7 ml 48.84 ml/m  AORTIC VALVE LVOT Vmax:   97.30 cm/s LVOT Vmean:  61.300 cm/s LVOT VTI:    0.154 m  AORTA Ao Root diam: 3.10 cm Ao Asc diam:  3.50 cm MITRAL VALVE                 TRICUSPID VALVE MV Area (PHT): 5.66 cm      TR Peak grad:   37.7 mmHg MV Decel Time: 134 msec      TR Vmax:        307.00 cm/s MR Peak grad:    91.0 mmHg MR Mean grad:    64.0 mmHg   SHUNTS MR Vmax:         477.00 cm/s Systemic VTI:  0.15 m MR Vmean:        380.5 cm/s  Systemic Diam: 1.90 cm MR PISA:         0.25 cm MR PISA Eff ROA: 2 mm MR PISA Radius:  0.20 cm MV E velocity: 102.00 cm/s MV A velocity: 50.60 cm/s MV E/A ratio:  2.02 Oswaldo Milian MD Electronically signed by Oswaldo Milian MD Signature Date/Time: 05/08/2021/4:20:23 PM    Final     Cardiac Studies   Echocardiogram 05/08/2021 1. Left ventricular ejection fraction, by estimation, is 25 to 30%. The left ventricle has severely decreased function. The left ventricle demonstrates global hypokinesis. There is mild left ventricular hypertrophy. Left ventricular diastolic parameters  are consistent with Grade III diastolic dysfunction (restrictive). Elevated left atrial pressure.   2. Right ventricular systolic function is  normal. The right ventricular size is mildly enlarged. There is moderately elevated pulmonary artery systolic pressure. The estimated right ventricular systolic pressure is 24.0 mmHg.   3. Left atrial size was moderately dilated.   4. Right atrial size was mildly dilated.   5. A small pericardial effusion is present.   6. The mitral valve is normal in structure. Mild to moderate mitral valve regurgitation.   7. Tricuspid valve regurgitation is mild to moderate.   8. The aortic valve is tricuspid. Aortic valve regurgitation is not visualized. Mild aortic valve sclerosis is present, with no evidence of aortic valve stenosis.   9. The inferior vena cava is dilated in size with >50% respiratory variability, suggesting right atrial pressure of 8 mmHg.  Patient Profile     60 y.o. 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  Assessment & Plan    Even taking into account her very low body weight, there is  evidence that Mrs. Polhamus has lost even more true weight and was fluid overloaded on admission, based on her clinical presentation, severely elevated BNP and abnormality seen on echocardiogram performed yesterday afternoon, just before dialysis.  Need to challenge her dry weight to avoid repeated episodes of heart failure decompensation. Medical management of heart failure is limited due to history of angioedema with ACE inhibitors and ESRD we will continue titration of hydralazine/nitrates as allowed by blood pressure.  Hydralazine nitrate started just yesterday, we will not plan titration until we see how she does through several sessions of hemodialysis.  [facility] will sign off.   Medication Recommendations:   -Hydralazine 25 mg 3 times daily -Isosorbide mononitrate 30 mg once daily -Metoprolol succinate 100  mg once daily Stop losartan  Other recommendations (labs, testing, etc): Sodium restricted renal diet, daily weight monitoring, call physician for weights exceeding 108 pounds Follow up as an outpatient: We will make arrangements for office follow-up in 2 weeks.      For questions or updates, please contact [place] Please consult [email or web address] for contact info under        Signed, Sanda Klein, MD  05/09/2021, 8:19 AM

## 2021-05-09 NOTE — Progress Notes (Addendum)
Orders for HD were placed at 1145a, Pt had already received Beta Blocker and IMDUR. HD RN Leighton Roach notified. HD RN said he would check in with MD before administering treatment.

## 2021-05-09 NOTE — Plan of Care (Signed)
Follow up appointment made for cardiology follow up on 06/14/21, first available. [facility] unable to see the patient due to ESRD status.

## 2021-05-09 NOTE — Progress Notes (Signed)
Pt arrived to HD unit from room 2c 16 on bedside monitor, wearing 2L oxygen  RN informed this Pryor Curia that pt had received her blood pressure medications including metoprolol and imdur today - verifed with provider that pt ok to dialyze. Parameters given to keep sbp >95, and to lower goal to 1000 if necessary.  Pt started on tx with goal of 2.5 L. Tolerated well until last 30 minutes. Pressures softened, but not below parameters given, and she became nauseous, vomiting several times. UF was turned off, bfr slowed to 375, and she recovered. 2 L had been removed at this time. No other incidents.  Vitals stable afterwards, tx completed.

## 2021-05-09 NOTE — Progress Notes (Signed)
Patient ID: Cynthia Bray, female   DOB: 04/25/1961, 60 y.o.   MRN: [id number]  PROGRESS NOTE    Cynthia Bray  YQM:[id number] DOB: 01-26-1961 DOA: 05/07/2021 PCP: Pcp, No   Brief Narrative:  60 year old female with history of end-stage renal disease on hemodialysis, hypertension, chronic systolic heart failure with EF of 25 to 30%, recent staph epidermidis bacteremia status post completion of Ancef on 05/02/2021, recent admission to [facility] on 04/26/2021 with chest pain, pulmonary edema, respiratory failure requiring BiPAP with improvement of symptoms after dialysis presented with chest pain and shortness of breath which resulted in stopping of dialysis.  On presentation, she was tachycardic with heart rates up to 150 and respiratory rate of 30s to 40s.  BNP of more than 4500 and troponin was 91.  CT chest showed CHF with pulmonary edema and bilateral pulmonary consolidations concerning for multilobar pneumonia.  COVID testing was negative.  She was given cefepime and vancomycin by ED provider.  Assessment & Plan:   Acute on chronic systolic CHF/volume overload/pulmonary edema Positive troponin: Most likely from demand ischemia from above -Patient's volume has recently been managed by dialysis.  Presented with chest pain and shortness of breath and CT chest showed findings suggestive of CHF with bilateral pleural effusions and?  Multilobar pneumonia.  COVID testing was negative on presentation.  Received a dose of cefepime and vancomycin in the ED.  Doubt that patient has bacterial pneumonia and will hold off on antibiotics. -Nephrology and cardiology following.  Patient had hemodialysis on 05/08/2021. -Strict input and output.  Daily weights.  Fluid restriction. -Continue isosorbide mononitrate, hydralazine and beta-blocker.  Losartan on hold since patient has history of angioedema to lisinopril  End-stage renal disease on hemodialysis -Follow nephrology  recommendations  Hypercalcemia -Improving after dialysis.  Continue Cinacalcet  Anemia of chronic disease/macrocytosis -Hemoglobin stable.  No signs of bleeding.  Hypertension -Monitor blood pressure.  Continue hydralazine, metoprolol and Imdur.   DVT prophylaxis: Heparin subcutaneous Code Status: Full Family Communication: None at bedside Disposition Plan: Status is: Observation  The patient will require care spanning > 2 midnights and should be moved to inpatient because: Inpatient level of care appropriate due to severity of illness  Dispo: The patient is from: Home              Anticipated d/c is to: Home in 1 to 2 days once cleared by cardiology and nephrology              Patient currently is not medically stable to d/c.   Difficult to place patient No   Consultants: Nephrology/cardiology  Procedures: None  Antimicrobials: 1 dose of cefepime and vancomycin in the ED   Subjective: Patient seen and examined at bedside.  Denies worsening fever or vomiting.  Still short of breath with exertion.  Complains of intermittent cough.  Feels slightly better. Objective: Vitals:   05/08/21 2343 05/09/21 0041 05/09/21 0410 05/09/21 0501  BP: 125/75   107/84  Pulse: 95     Resp: 20     Temp: 98.1 F (36.7 C)     TempSrc: Oral     SpO2: 96%     Weight:  46.6 kg 46.6 kg   Height:        Intake/Output Summary (Last 24 hours) at 05/09/2021 0719 Last data filed at 05/09/2021 0000 Gross per 24 hour  Intake 480 ml  Output --  Net 480 ml   Filed Weights   05/08/21 2225 05/09/21 0041 05/09/21 0410  Weight: 47.5 kg 46.6 kg 46.6 kg    Examination:  General exam: No acute distress.  Still on 2 L oxygen via nasal cannula.  Respiratory system: Decreased breath sounds at bases bilaterally with some crackles; intermittently tachypneic Cardiovascular system: Rate controlled, S1-S2 heard Gastrointestinal system: Abdomen is slightly distended, soft and nontender.  Bowel sounds are  heard Extremities: Mild lower extremity edema present; no clubbing Central nervous system: Awake and alert.  No focal neurological deficits.  Moves extremities  skin: No obvious ecchymosis/rashes Psychiatry: Flat affect.  Hardly participates in any conversation   Data Reviewed: I have personally reviewed following labs and imaging studies  CBC: Recent Labs  Lab 05/07/21 1851 05/08/21 0831 05/09/21 0051  WBC 5.8 4.7 4.9  NEUTROABS 3.9  --  3.2  HGB 9.2* 8.7* 8.4*  HCT 29.6* 27.8* 26.1*  MCV 106.9* 103.3* 102.8*  PLT 185 165 938    Basic Metabolic Panel: Recent Labs  Lab 05/07/21 1656 05/08/21 0831 05/09/21 0051  NA 140 137 133*  K 4.5 5.1 3.2*  CL 99 98 96*  CO2 27 28 31   GLUCOSE 95 85 106*  BUN 19 22 7*  CREATININE 7.38* 8.08* 3.64*  CALCIUM 12.5* 11.6* 10.3  MG 2.0  --  1.8    GFR: Estimated Creatinine Clearance: 11.9 mL/min (A) (by C-G formula based on SCr of 3.64 mg/dL (H)). Liver Function Tests: No results for input(s): AST, ALT, ALKPHOS, BILITOT, PROT, ALBUMIN in the last 168 hours. No results for input(s): LIPASE, AMYLASE in the last 168 hours. No results for input(s): AMMONIA in the last 168 hours. Coagulation Profile: No results for input(s): INR, PROTIME in the last 168 hours. Cardiac Enzymes: No results for input(s): CKTOTAL, CKMB, CKMBINDEX, TROPONINI in the last 168 hours. BNP (last 3 results) No results for input(s): PROBNP in the last 8760 hours. HbA1C: No results for input(s): HGBA1C in the last 72 hours. CBG: No results for input(s): GLUCAP in the last 168 hours. Lipid Profile: No results for input(s): CHOL, HDL, LDLCALC, TRIG, CHOLHDL, LDLDIRECT in the last 72 hours. Thyroid Function Tests: No results for input(s): TSH, T4TOTAL, FREET4, T3FREE, THYROIDAB in the last 72 hours. Anemia Panel: No results for input(s): VITAMINB12, FOLATE, FERRITIN, TIBC, IRON, RETICCTPCT in the last 72 hours. Sepsis Labs: Recent Labs  Lab 05/07/21 1656  05/07/21 2330  PROCALCITON  --  1.28  LATICACIDVEN 3.2* 2.9*     Recent Results (from the past 240 hour(s))  Resp Panel by RT-PCR (Flu A&B, Covid) Nasopharyngeal Swab     Status: None   Collection Time: 05/07/21  4:57 PM   Specimen: Nasopharyngeal Swab; Nasopharyngeal(NP) swabs in vial transport medium  Result Value Ref Range Status   SARS Coronavirus 2 by RT PCR NEGATIVE NEGATIVE Final    Comment: (NOTE) SARS-CoV-2 target nucleic acids are NOT DETECTED.  The SARS-CoV-2 RNA is generally detectable in upper respiratory specimens during the acute phase of infection. The lowest concentration of SARS-CoV-2 viral copies this assay can detect is 138 copies/mL. A negative result does not preclude SARS-Cov-2 infection and should not be used as the sole basis for treatment or other patient management decisions. A negative result may occur with  improper specimen collection/handling, submission of specimen other than nasopharyngeal swab, presence of viral mutation(s) within the areas targeted by this assay, and inadequate number of viral copies(<138 copies/mL). A negative result must be combined with clinical observations, patient history, and epidemiological information. The expected result is Negative.  Fact Sheet for  Patients:  EntrepreneurPulse.com.au  Fact Sheet for Healthcare Providers:  IncredibleEmployment.be  This test is no t yet approved or cleared by the [place] FDA and  has been authorized for detection and/or diagnosis of SARS-CoV-2 by FDA under an Emergency Use Authorization (EUA). This EUA will remain  in effect (meaning this test can be used) for the duration of the COVID-19 declaration under Section 564(b)(1) of the Act, 21 U.S.C.section 360bbb-3(b)(1), unless the authorization is terminated  or revoked sooner.       Influenza A by PCR NEGATIVE NEGATIVE Final   Influenza B by PCR NEGATIVE NEGATIVE Final    Comment:  (NOTE) The Xpert Xpress SARS-CoV-2/FLU/RSV plus assay is intended as an aid in the diagnosis of influenza from Nasopharyngeal swab specimens and should not be used as a sole basis for treatment. Nasal washings and aspirates are unacceptable for Xpert Xpress SARS-CoV-2/FLU/RSV testing.  Fact Sheet for Patients: EntrepreneurPulse.com.au  Fact Sheet for Healthcare Providers: IncredibleEmployment.be  This test is not yet approved or cleared by the [place] FDA and has been authorized for detection and/or diagnosis of SARS-CoV-2 by FDA under an Emergency Use Authorization (EUA). This EUA will remain in effect (meaning this test can be used) for the duration of the COVID-19 declaration under Section 564(b)(1) of the Act, 21 U.S.C. section 360bbb-3(b)(1), unless the authorization is terminated or revoked.  Performed at [facility], [place] [address], [place], [place] [address]   Culture, blood (routine x 2)     Status: None (Preliminary result)   Collection Time: 05/07/21 11:33 PM   Specimen: BLOOD  Result Value Ref Range Status   Specimen Description   Final    BLOOD RIGHT ARM Performed at [facility] [address], [place], [place] [address]    Special Requests   Final    BOTTLES DRAWN AEROBIC AND ANAEROBIC Blood Culture adequate volume Performed at [facility] [address], [place], [place] [address]    Culture   Final    NO GROWTH < 12 HOURS Performed at [place] [address], [place], [place] [address]    Report Status PENDING  Incomplete  Culture, blood (routine x 2)     Status: None (Preliminary result)   Collection Time: 05/08/21  8:31 AM   Specimen: BLOOD  Result Value Ref Range Status   Specimen Description BLOOD LEFT ANTECUBITAL  Final   Special Requests   Final    BOTTLES DRAWN AEROBIC AND ANAEROBIC Blood Culture results may not be optimal due to an  inadequate volume of blood received in culture bottles   Culture   Final    NO GROWTH < 12 HOURS Performed at [facility], [place] [address] [place], [place] [address]    Report Status PENDING  Incomplete  MRSA Next Gen by PCR, Nasal     Status: None   Collection Time: 05/08/21  8:34 AM   Specimen: Nasal Mucosa; Nasal Swab  Result Value Ref Range Status   MRSA by PCR Next Gen NOT DETECTED NOT DETECTED Final    Comment: (NOTE) The GeneXpert MRSA Assay (FDA approved for NASAL specimens only), is one component of a comprehensive MRSA colonization surveillance program. It is not intended to diagnose MRSA infection nor to guide or monitor treatment for MRSA infections. Test performance is not FDA approved in patients less than 62 years old. Performed at [facility], [place] [address], [place],  [address]  Radiology Studies: DG Chest 2 View  Result Date: 05/07/2021 CLINICAL DATA:  Chest pain since yesterday. EXAM: CHEST - 2 VIEW COMPARISON:  Single-view of the chest earlier today and 04/25/2021. FINDINGS: Right basilar airspace disease is worse than on the study earlier today. Left basilar airspace disease appears unchanged. There small bilateral pleural effusions. No pneumothorax. Cardiomegaly. IMPRESSION: Right basilar airspace disease is worse on the study earlier today could be due to increased atelectasis or pneumonia. Left basilar airspace disease is unchanged. Small bilateral pleural effusions are unchanged. Cardiomegaly. Electronically Signed   By: Inge Rise M.D.   On: 05/07/2021 18:55   CT Angio Chest PE W/Cm &/Or Wo Cm  Result Date: 05/07/2021 CLINICAL DATA:  60 year old female with concern for pulmonary embolism. EXAM: CT ANGIOGRAPHY CHEST WITH CONTRAST TECHNIQUE: Multidetector CT imaging of the chest was performed using the standard protocol during bolus administration of intravenous contrast. Multiplanar CT image reconstructions and MIPs were  obtained to evaluate the vascular anatomy. CONTRAST:  8mL OMNIPAQUE IOHEXOL 350 MG/ML SOLN COMPARISON:  Chest radiograph dated 05/07/2021. FINDINGS: Cardiovascular: There is cardiomegaly. No pericardial effusion. Mild atherosclerotic calcification of the thoracic aorta. Evaluation of the aorta is limited due to suboptimal opacification and streak artifact caused by patient's arms. Evaluation of the pulmonary arteries is limited due to respiratory motion artifact as well as streak artifact caused by patient's arms and suboptimal visualization of the peripheral branches. No pulmonary artery embolus identified. Mediastinum/Nodes: Right hilar adenopathy measures 2 cm. Evaluation of lymph node is very limited due to consolidative changes of the lungs. The esophagus is grossly unremarkable. No mediastinal fluid collection. Lungs/Pleura: Small bilateral pleural effusions. There is patchy areas of consolidative changes primarily involving the lower lobes. Patchy left upper lobe as well as diffuse ground-glass density in the right upper lobe noted. Findings concerning for multi lobar pneumonia. Clinical correlation and follow-up to resolution recommended. No pneumothorax. The central airways are patent. Upper Abdomen: Bilateral renal atrophy. Musculoskeletal: There is diffuse subcutaneous edema and anasarca. There is osteosclerosis consistent with renal osteodystrophy. No acute osseous pathology. Review of the MIP images confirms the above findings. IMPRESSION: 1. No CT evidence of pulmonary embolism. 2. Cardiomegaly with findings of CHF and small bilateral pleural effusions. 3. Bilateral pulmonary consolidations concerning for multilobar pneumonia. Clinical correlation and follow-up to resolution recommended. 4. Aortic Atherosclerosis (ICD10-I70.0). Electronically Signed   By: Anner Crete M.D.   On: 05/07/2021 20:54   ECHOCARDIOGRAM COMPLETE  Result Date: 05/08/2021    ECHOCARDIOGRAM REPORT   Patient Name:    ELINE GENG Date of Exam: 05/08/2021 Medical Rec #:  [id number]     Height:       68.0 in Accession #:    [id number]    Weight:       107.6 lb Date of Birth:  1961-08-10    BSA:          1.570 m Patient Age:    34 years      BP:           124/78 mmHg Patient Gender: F             HR:           81 bpm. Exam Location:  Inpatient Procedure: 2D Echo, Cardiac Doppler and Color Doppler Indications:    Murmur  History:        Patient has no prior history of Echocardiogram examinations.  CHF; Risk Factors:Hypertension.  Sonographer:    Bernadene Person RDCS Referring Phys: [place]  1. Left ventricular ejection fraction, by estimation, is 25 to 30%. The left ventricle has severely decreased function. The left ventricle demonstrates global hypokinesis. There is mild left ventricular hypertrophy. Left ventricular diastolic parameters  are consistent with Grade III diastolic dysfunction (restrictive). Elevated left atrial pressure.  2. Right ventricular systolic function is normal. The right ventricular size is mildly enlarged. There is moderately elevated pulmonary artery systolic pressure. The estimated right ventricular systolic pressure is 76.2 mmHg.  3. Left atrial size was moderately dilated.  4. Right atrial size was mildly dilated.  5. A small pericardial effusion is present.  6. The mitral valve is normal in structure. Mild to moderate mitral valve regurgitation.  7. Tricuspid valve regurgitation is mild to moderate.  8. The aortic valve is tricuspid. Aortic valve regurgitation is not visualized. Mild aortic valve sclerosis is present, with no evidence of aortic valve stenosis.  9. The inferior vena cava is dilated in size with >50% respiratory variability, suggesting right atrial pressure of 8 mmHg. FINDINGS  Left Ventricle: Left ventricular ejection fraction, by estimation, is 25 to 30%. The left ventricle has severely decreased function. The left ventricle demonstrates  global hypokinesis. The left ventricular internal cavity size was normal in size. There is mild left ventricular hypertrophy. Left ventricular diastolic parameters are consistent with Grade III diastolic dysfunction (restrictive). Elevated left atrial pressure. Right Ventricle: The right ventricular size is mildly enlarged. No increase in right ventricular wall thickness. Right ventricular systolic function is normal. There is moderately elevated pulmonary artery systolic pressure. The tricuspid regurgitant velocity is 3.07 m/s, and with an assumed right atrial pressure of 8 mmHg, the estimated right ventricular systolic pressure is 83.1 mmHg. Left Atrium: Left atrial size was moderately dilated. Right Atrium: Right atrial size was mildly dilated. Pericardium: A small pericardial effusion is present. Mitral Valve: The mitral valve is normal in structure. Mild to moderate mitral valve regurgitation. Tricuspid Valve: The tricuspid valve is normal in structure. Tricuspid valve regurgitation is mild to moderate. Aortic Valve: The aortic valve is tricuspid. Aortic valve regurgitation is not visualized. Mild aortic valve sclerosis is present, with no evidence of aortic valve stenosis. Pulmonic Valve: The pulmonic valve was not well visualized. Pulmonic valve regurgitation is mild. Aorta: The aortic root and ascending aorta are structurally normal, with no evidence of dilitation. Venous: The inferior vena cava is dilated in size with greater than 50% respiratory variability, suggesting right atrial pressure of 8 mmHg. IAS/Shunts: The interatrial septum was not well visualized.  LEFT VENTRICLE PLAX 2D LVIDd:         5.10 cm      Diastology LVIDs:         4.40 cm      LV e' medial:    3.83 cm/s LV PW:         1.10 cm      LV E/e' medial:  26.6 LV IVS:        0.90 cm      LV e' lateral:   6.70 cm/s LVOT diam:     1.90 cm      LV E/e' lateral: 15.2 LV SV:         44 LV SV Index:   28 LVOT Area:     2.84 cm  LV Volumes (MOD)  LV vol d, MOD A2C: 127.0 ml LV vol d, MOD A4C: 112.0 ml  LV vol s, MOD A2C: 86.0 ml LV vol s, MOD A4C: 84.6 ml LV SV MOD A2C:     41.0 ml LV SV MOD A4C:     112.0 ml LV SV MOD BP:      33.3 ml RIGHT VENTRICLE RV S prime:     13.80 cm/s TAPSE (M-mode): 2.1 cm LEFT ATRIUM             Index       RIGHT ATRIUM           Index LA diam:        3.00 cm 1.91 cm/m  RA Area:     18.50 cm LA Vol (A2C):   67.9 ml 43.24 ml/m RA Volume:   52.40 ml  33.37 ml/m LA Vol (A4C):   72.9 ml 46.42 ml/m LA Biplane Vol: 76.7 ml 48.84 ml/m  AORTIC VALVE LVOT Vmax:   97.30 cm/s LVOT Vmean:  61.300 cm/s LVOT VTI:    0.154 m  AORTA Ao Root diam: 3.10 cm Ao Asc diam:  3.50 cm MITRAL VALVE                 TRICUSPID VALVE MV Area (PHT): 5.66 cm      TR Peak grad:   37.7 mmHg MV Decel Time: 134 msec      TR Vmax:        307.00 cm/s MR Peak grad:    91.0 mmHg MR Mean grad:    64.0 mmHg   SHUNTS MR Vmax:         477.00 cm/s Systemic VTI:  0.15 m MR Vmean:        380.5 cm/s  Systemic Diam: 1.90 cm MR PISA:         0.25 cm MR PISA Eff ROA: 2 mm MR PISA Radius:  0.20 cm MV E velocity: 102.00 cm/s MV A velocity: 50.60 cm/s MV E/A ratio:  2.02 Oswaldo Milian MD Electronically signed by Oswaldo Milian MD Signature Date/Time: 05/08/2021/4:20:23 PM    Final         Scheduled Meds:  atorvastatin  40 mg Oral q1800   Chlorhexidine Gluconate Cloth  6 each Topical Q0600   cinacalcet  30 mg Oral Q breakfast   cinacalcet  30 mg Oral Q T,Th,Sa-HD   [START ON 05/15/2021] darbepoetin (ARANESP) injection - DIALYSIS  60 mcg Intravenous Q Wed-HD   feeding supplement (NEPRO CARB STEADY)  237 mL Oral BID BM   heparin  5,000 Units Subcutaneous Q8H   hydrALAZINE  25 mg Oral Q8H   isosorbide mononitrate  30 mg Oral Daily   metoprolol succinate  100 mg Oral Daily   multivitamin  1 tablet Oral QHS   Continuous Infusions:        Aline August, MD [facility] 05/09/2021, 7:19 AM

## 2021-05-09 NOTE — Plan of Care (Signed)
  Problem: Health Behavior/Discharge Planning: Goal: Ability to manage health-related needs will improve Outcome: Not Progressing   Problem: Clinical Measurements: Goal: Ability to maintain clinical measurements within normal limits will improve Outcome: Not Progressing Goal: Diagnostic test results will improve Outcome: Not Progressing   Problem: Activity: Goal: Risk for activity intolerance will decrease Outcome: Not Progressing   Problem: Nutrition: Goal: Adequate nutrition will be maintained Outcome: Not Progressing

## 2021-05-10 DIAGNOSIS — E43 Unspecified severe protein-calorie malnutrition: Secondary | ICD-10-CM

## 2021-05-10 DIAGNOSIS — J810 Acute pulmonary edema: Secondary | ICD-10-CM

## 2021-05-10 LAB — BASIC METABOLIC PANEL
Anion gap: 10 (ref 5–15)
BUN: <5 mg/dL — ABNORMAL LOW (ref 8–23)
CO2: 29 mmol/L (ref 22–32)
Calcium: 10 mg/dL (ref 8.9–10.3)
Chloride: 96 mmol/L — ABNORMAL LOW (ref 98–111)
Creatinine, Ser: 2.62 mg/dL — ABNORMAL HIGH (ref 0.44–1.00)
GFR, Estimated: 20 mL/min — ABNORMAL LOW (ref >60)
Glucose, Bld: 85 mg/dL (ref 70–99)
Potassium: 3.4 mmol/L — ABNORMAL LOW (ref 3.5–5.1)
Sodium: 135 mmol/L (ref 135–145)

## 2021-05-10 LAB — HEPATITIS B SURFACE ANTIBODY, QUANTITATIVE
Hep B S AB Quant (Post): >1000 m[IU]/mL (ref >9.9)
Hep B S AB Quant (Post): >1000 m[IU]/mL (ref >9.9)

## 2021-05-10 MED ORDER — ISOSORBIDE MONONITRATE ER 30 MG PO TB24: 30.0000 mg | ORAL_TABLET | Freq: Every day | ORAL | 0 refills | Status: DC

## 2021-05-10 MED ORDER — HYDRALAZINE HCL 25 MG PO TABS
25.0000 mg | ORAL_TABLET | Freq: Three times a day (TID) | ORAL | 0 refills | Status: DC
Start: 2021-05-10 — End: 2022-04-15

## 2021-05-10 MED ORDER — RENA-VITE PO TABS: 1.0000 | ORAL_TABLET | Freq: Every day | ORAL | 0 refills | Status: DC

## 2021-05-10 MED ORDER — CINACALCET HCL 30 MG PO TABS: 30.0000 mg | ORAL_TABLET | ORAL | 0 refills | Status: DC

## 2021-05-10 MED ORDER — METOPROLOL SUCCINATE ER 100 MG PO TB24: 100.0000 mg | ORAL_TABLET | Freq: Every day | ORAL | 0 refills | Status: DC

## 2021-05-10 NOTE — TOC Transition Note (Signed)
Transition of Care [facility]) - CM/SW Discharge Note   Patient Details  Name: Cynthia Bray MRN: [id number] Date of Birth: 01/26/1961  [facility]) CM/SW Contact:  Zenon Mayo, RN Phone Number: 05/10/2021, 9:46 AM   Clinical Narrative:    NCM spoke with patient regarding PCP, NCM scheduled a follow up with Dr. Doristine Section bonsu in [place], listed on AVS.  Patient will be living with her relative in [place].    Final next level of care: Home/Self Care Barriers to Discharge: No Barriers Identified   Patient Goals and CMS Choice Patient states their goals for this hospitalization and ongoing recovery are:: return home with relative      Discharge Placement                       Discharge Plan and Services                  DME Agency: NA       HH Arranged: NA          Social Determinants of Health (SDOH) Interventions     Readmission Risk Interventions No flowsheet data found.

## 2021-05-10 NOTE — Plan of Care (Signed)

## 2021-05-10 NOTE — Discharge Summary (Signed)
Physician Discharge Summary  Ivanna Kocak JME:[id number] DOB: 15-Jun-1961 DOA: 05/07/2021  PCP: Merryl Hacker, No  Admit date: 05/07/2021 Discharge date: 05/10/2021  Admitted From: Home Disposition: Home  Recommendations for Outpatient Follow-up:  Follow up with PCP in 1 week with repeat CBC/BMP Outpatient follow-up with dialysis as scheduled Outpatient follow-up with cardiology Follow up in ED if symptoms worsen or new appear   Home Health: No Equipment/Devices: None  Discharge Condition: Stable CODE STATUS: Full Diet recommendation: Heart healthy/fluid restriction of up to 1200 cc a day  Brief/Interim Summary: 60 year old female with history of end-stage renal disease on hemodialysis, hypertension, chronic systolic heart failure with EF of 25 to 30%, recent staph epidermidis bacteremia status post completion of Ancef on 05/02/2021, recent admission to [facility] on 04/26/2021 with chest pain, pulmonary edema, respiratory failure requiring BiPAP with improvement of symptoms after dialysis presented with chest pain and shortness of breath which resulted in stopping of dialysis.  On presentation, she was tachycardic with heart rates up to 150 and respiratory rate of 30s to 40s.  BNP of more than 4500 and troponin was 91.  CT chest showed CHF with pulmonary edema and bilateral pulmonary consolidations concerning for multilobar pneumonia.  COVID testing was negative.  She was given cefepime and vancomycin by ED provider.  During the hospitalizing, her condition has improved.  Antibiotics were not continued.  Nephrology and cardiology were consulted.  She underwent dialysis on 2 consecutive days.  She is currently on room air.  Cardiology has signed off.  Nephrology has cleared the patient for discharge home today with outpatient follow-up with dialysis as scheduled.  She will be discharged home today.  Discharge Diagnoses:   Acute on chronic systolic CHF/volume overload/pulmonary  edema Positive troponin: Most likely from demand ischemia from above -Patient's volume has recently been managed by dialysis.  Presented with chest pain and shortness of breath and CT chest showed findings suggestive of CHF with bilateral pleural effusions and?  Multilobar pneumonia.  COVID testing was negative on presentation.  Received a dose of cefepime and vancomycin in the ED.  Doubt that patient has bacterial pneumonia and hence antibiotics were not continued. -Nephrology and cardiology followed the patient during the hospitalization.  Patient had hemodialysis on 05/08/2021 and on 05/09/2021. -Continue diet and fluid restriction. -Cardiology has signed off with outpatient follow-up with cardiology with recommendations to continue isosorbide mononitrate, hydralazine and beta-blocker.  Losartan has been discontinued since patient has history of angioedema to lisinopril -Nephrology has cleared the patient for discharge home today with outpatient follow-up with dialysis as scheduled.  She will be discharged home today. -Currently on room air    End-stage renal disease on hemodialysis -Plan as above.   Hypercalcemia -Improving after dialysis.  Continue Cinacalcet   Anemia of chronic disease/macrocytosis -Hemoglobin stable.  No signs of bleeding.  Outpatient follow-up.   Hypertension -Plan as above.  Outpatient follow-up.  Continue hydralazine, metoprolol and Imdur.   Severe malnutrition -Outpatient follow-up.  Discharge Instructions  Discharge Instructions     Ambulatory referral to Cardiology   Complete by: As directed    Diet - low sodium heart healthy   Complete by: As directed    Fluid restriction of up to 1200 cc a day   Increase activity slowly   Complete by: As directed       Allergies as of 05/10/2021       Reactions   Lisinopril Swelling   Angioedema   Penicillin G Swelling  Medication List     STOP taking these medications    losartan 25 MG  tablet Commonly known as: COZAAR   metoprolol tartrate 25 MG tablet Commonly known as: LOPRESSOR   multivitamin tablet       TAKE these medications    aspirin EC 81 MG tablet Take 81 mg by mouth daily. Swallow whole.   atorvastatin 40 MG tablet Commonly known as: LIPITOR Take 40 mg by mouth every evening.   cinacalcet 30 MG tablet Commonly known as: SENSIPAR Take 1 tablet (30 mg total) by mouth Every Tuesday,Thursday,and Saturday with dialysis. Start taking on: May 11, 2021   hydrALAZINE 25 MG tablet Commonly known as: APRESOLINE Take 1 tablet (25 mg total) by mouth every 8 (eight) hours.   isosorbide mononitrate 30 MG 24 hr tablet Commonly known as: IMDUR Take 1 tablet (30 mg total) by mouth daily.   lidocaine-prilocaine cream Commonly known as: EMLA Apply 1 application topically See admin instructions. Apply small amount to access site 1-2 hours before dialysis.   metoprolol succinate 100 MG 24 hr tablet Commonly known as: TOPROL-XL Take 1 tablet (100 mg total) by mouth daily. Take with or immediately following a meal.   multivitamin Tabs tablet Take 1 tablet by mouth at bedtime.   pantoprazole 40 MG tablet Commonly known as: PROTONIX Take 40 mg by mouth 2 (two) times daily.   Velphoro 500 MG chewable tablet Generic drug: sucroferric oxyhydroxide Chew 500 mg by mouth 3 (three) times daily.        Follow-up Information     Croitoru, Mihai, MD Follow up on 06/14/2021.   Specialty: Cardiology Why: please arrive 15 min before 1:30PM for your post hospital cardiology follow up appointment Contact information: [address] [place] [address] [phone]         PCP. Schedule an appointment as soon as possible for a visit in 1 week(s).                 Allergies  Allergen Reactions   Lisinopril Swelling    Angioedema    Penicillin G Swelling    Consultations: Cardiology and nephrology   Procedures/Studies: DG  Chest 2 View  Result Date: 05/07/2021 CLINICAL DATA:  Chest pain since yesterday. EXAM: CHEST - 2 VIEW COMPARISON:  Single-view of the chest earlier today and 04/25/2021. FINDINGS: Right basilar airspace disease is worse than on the study earlier today. Left basilar airspace disease appears unchanged. There small bilateral pleural effusions. No pneumothorax. Cardiomegaly. IMPRESSION: Right basilar airspace disease is worse on the study earlier today could be due to increased atelectasis or pneumonia. Left basilar airspace disease is unchanged. Small bilateral pleural effusions are unchanged. Cardiomegaly. Electronically Signed   By: Inge Rise M.D.   On: 05/07/2021 18:55   CT Angio Chest PE W/Cm &/Or Wo Cm  Result Date: 05/07/2021 CLINICAL DATA:  60 year old female with concern for pulmonary embolism. EXAM: CT ANGIOGRAPHY CHEST WITH CONTRAST TECHNIQUE: Multidetector CT imaging of the chest was performed using the standard protocol during bolus administration of intravenous contrast. Multiplanar CT image reconstructions and MIPs were obtained to evaluate the vascular anatomy. CONTRAST:  53mL OMNIPAQUE IOHEXOL 350 MG/ML SOLN COMPARISON:  Chest radiograph dated 05/07/2021. FINDINGS: Cardiovascular: There is cardiomegaly. No pericardial effusion. Mild atherosclerotic calcification of the thoracic aorta. Evaluation of the aorta is limited due to suboptimal opacification and streak artifact caused by patient's arms. Evaluation of the pulmonary arteries is limited due to respiratory motion artifact as well  as streak artifact caused by patient's arms and suboptimal visualization of the peripheral branches. No pulmonary artery embolus identified. Mediastinum/Nodes: Right hilar adenopathy measures 2 cm. Evaluation of lymph node is very limited due to consolidative changes of the lungs. The esophagus is grossly unremarkable. No mediastinal fluid collection. Lungs/Pleura: Small bilateral pleural effusions. There is  patchy areas of consolidative changes primarily involving the lower lobes. Patchy left upper lobe as well as diffuse ground-glass density in the right upper lobe noted. Findings concerning for multi lobar pneumonia. Clinical correlation and follow-up to resolution recommended. No pneumothorax. The central airways are patent. Upper Abdomen: Bilateral renal atrophy. Musculoskeletal: There is diffuse subcutaneous edema and anasarca. There is osteosclerosis consistent with renal osteodystrophy. No acute osseous pathology. Review of the MIP images confirms the above findings. IMPRESSION: 1. No CT evidence of pulmonary embolism. 2. Cardiomegaly with findings of CHF and small bilateral pleural effusions. 3. Bilateral pulmonary consolidations concerning for multilobar pneumonia. Clinical correlation and follow-up to resolution recommended. 4. Aortic Atherosclerosis (ICD10-I70.0). Electronically Signed   By: Anner Crete M.D.   On: 05/07/2021 20:54   ECHOCARDIOGRAM COMPLETE  Result Date: 05/08/2021    ECHOCARDIOGRAM REPORT   Patient Name:   THEOLA CUELLAR Date of Exam: 05/08/2021 Medical Rec #:  [id number]     Height:       68.0 in Accession #:    [id number]    Weight:       107.6 lb Date of Birth:  [birth year]    BSA:          1.570 m Patient Age:    60 years      BP:           124/78 mmHg Patient Gender: F             HR:           81 bpm. Exam Location:  Inpatient Procedure: 2D Echo, Cardiac Doppler and Color Doppler Indications:    Murmur  History:        Patient has no prior history of Echocardiogram examinations.                 CHF; Risk Factors:Hypertension.  Sonographer:    Bernadene Person RDCS Referring Phys: [place]  1. Left ventricular ejection fraction, by estimation, is 25 to 30%. The left ventricle has severely decreased function. The left ventricle demonstrates global hypokinesis. There is mild left ventricular hypertrophy. Left ventricular diastolic parameters  are consistent  with Grade III diastolic dysfunction (restrictive). Elevated left atrial pressure.  2. Right ventricular systolic function is normal. The right ventricular size is mildly enlarged. There is moderately elevated pulmonary artery systolic pressure. The estimated right ventricular systolic pressure is 82.5 mmHg.  3. Left atrial size was moderately dilated.  4. Right atrial size was mildly dilated.  5. A small pericardial effusion is present.  6. The mitral valve is normal in structure. Mild to moderate mitral valve regurgitation.  7. Tricuspid valve regurgitation is mild to moderate.  8. The aortic valve is tricuspid. Aortic valve regurgitation is not visualized. Mild aortic valve sclerosis is present, with no evidence of aortic valve stenosis.  9. The inferior vena cava is dilated in size with >50% respiratory variability, suggesting right atrial pressure of 8 mmHg. FINDINGS  Left Ventricle: Left ventricular ejection fraction, by estimation, is 25 to 30%. The left ventricle has severely decreased function. The left ventricle demonstrates global hypokinesis. The left ventricular internal cavity size was  normal in size. There is mild left ventricular hypertrophy. Left ventricular diastolic parameters are consistent with Grade III diastolic dysfunction (restrictive). Elevated left atrial pressure. Right Ventricle: The right ventricular size is mildly enlarged. No increase in right ventricular wall thickness. Right ventricular systolic function is normal. There is moderately elevated pulmonary artery systolic pressure. The tricuspid regurgitant velocity is 3.07 m/s, and with an assumed right atrial pressure of 8 mmHg, the estimated right ventricular systolic pressure is 79.8 mmHg. Left Atrium: Left atrial size was moderately dilated. Right Atrium: Right atrial size was mildly dilated. Pericardium: A small pericardial effusion is present. Mitral Valve: The mitral valve is normal in structure. Mild to moderate mitral valve  regurgitation. Tricuspid Valve: The tricuspid valve is normal in structure. Tricuspid valve regurgitation is mild to moderate. Aortic Valve: The aortic valve is tricuspid. Aortic valve regurgitation is not visualized. Mild aortic valve sclerosis is present, with no evidence of aortic valve stenosis. Pulmonic Valve: The pulmonic valve was not well visualized. Pulmonic valve regurgitation is mild. Aorta: The aortic root and ascending aorta are structurally normal, with no evidence of dilitation. Venous: The inferior vena cava is dilated in size with greater than 50% respiratory variability, suggesting right atrial pressure of 8 mmHg. IAS/Shunts: The interatrial septum was not well visualized.  LEFT VENTRICLE PLAX 2D LVIDd:         5.10 cm      Diastology LVIDs:         4.40 cm      LV e' medial:    3.83 cm/s LV PW:         1.10 cm      LV E/e' medial:  26.6 LV IVS:        0.90 cm      LV e' lateral:   6.70 cm/s LVOT diam:     1.90 cm      LV E/e' lateral: 15.2 LV SV:         44 LV SV Index:   28 LVOT Area:     2.84 cm  LV Volumes (MOD) LV vol d, MOD A2C: 127.0 ml LV vol d, MOD A4C: 112.0 ml LV vol s, MOD A2C: 86.0 ml LV vol s, MOD A4C: 84.6 ml LV SV MOD A2C:     41.0 ml LV SV MOD A4C:     112.0 ml LV SV MOD BP:      33.3 ml RIGHT VENTRICLE RV S prime:     13.80 cm/s TAPSE (M-mode): 2.1 cm LEFT ATRIUM             Index       RIGHT ATRIUM           Index LA diam:        3.00 cm 1.91 cm/m  RA Area:     18.50 cm LA Vol (A2C):   67.9 ml 43.24 ml/m RA Volume:   52.40 ml  33.37 ml/m LA Vol (A4C):   72.9 ml 46.42 ml/m LA Biplane Vol: 76.7 ml 48.84 ml/m  AORTIC VALVE LVOT Vmax:   97.30 cm/s LVOT Vmean:  61.300 cm/s LVOT VTI:    0.154 m  AORTA Ao Root diam: 3.10 cm Ao Asc diam:  3.50 cm MITRAL VALVE                 TRICUSPID VALVE MV Area (PHT): 5.66 cm      TR Peak grad:   37.7 mmHg MV Decel Time: 134 msec  TR Vmax:        307.00 cm/s MR Peak grad:    91.0 mmHg MR Mean grad:    64.0 mmHg   SHUNTS MR Vmax:          477.00 cm/s Systemic VTI:  0.15 m MR Vmean:        380.5 cm/s  Systemic Diam: 1.90 cm MR PISA:         0.25 cm MR PISA Eff ROA: 2 mm MR PISA Radius:  0.20 cm MV E velocity: 102.00 cm/s MV A velocity: 50.60 cm/s MV E/A ratio:  2.02 Oswaldo Milian MD Electronically signed by Oswaldo Milian MD Signature Date/Time: 05/08/2021/4:20:23 PM    Final       Subjective: Patient seen and examined at bedside.  She feels much better and feels okay to go home today.  Denies worsening shortness of breath, chest pain, nausea or vomiting or fever.  Discharge Exam: Vitals:   05/10/21 0418 05/10/21 0722  BP: (!) 102/55 116/69  Pulse: 71 78  Resp: 20 14  Temp: 98.1 F (36.7 C) 97.9 F (36.6 C)  SpO2: 96% 94%    General: Pt is alert, awake, not in acute distress.  Looks chronically ill and deconditioned.  Very thinly built. Cardiovascular: rate controlled, S1/S2 + Respiratory: bilateral decreased breath sounds at bases with some scattered crackles Abdominal: Soft, NT, ND, bowel sounds + Extremities: Trace lower extremity edema; no cyanosis    The results of significant diagnostics from this hospitalization (including imaging, microbiology, ancillary and laboratory) are listed below for reference.     Microbiology: Recent Results (from the past 240 hour(s))  Resp Panel by RT-PCR (Flu A&B, Covid) Nasopharyngeal Swab     Status: None   Collection Time: 05/07/21  4:57 PM   Specimen: Nasopharyngeal Swab; Nasopharyngeal(NP) swabs in vial transport medium  Result Value Ref Range Status   SARS Coronavirus 2 by RT PCR NEGATIVE NEGATIVE Final    Comment: (NOTE) SARS-CoV-2 target nucleic acids are NOT DETECTED.  The SARS-CoV-2 RNA is generally detectable in upper respiratory specimens during the acute phase of infection. The lowest concentration of SARS-CoV-2 viral copies this assay can detect is 138 copies/mL. A negative result does not preclude SARS-Cov-2 infection and should not be used as  the sole basis for treatment or other patient management decisions. A negative result may occur with  improper specimen collection/handling, submission of specimen other than nasopharyngeal swab, presence of viral mutation(s) within the areas targeted by this assay, and inadequate number of viral copies(<138 copies/mL). A negative result must be combined with clinical observations, patient history, and epidemiological information. The expected result is Negative.  Fact Sheet for Patients:  EntrepreneurPulse.com.au  Fact Sheet for Healthcare Providers:  IncredibleEmployment.be  This test is no t yet approved or cleared by the [place] FDA and  has been authorized for detection and/or diagnosis of SARS-CoV-2 by FDA under an Emergency Use Authorization (EUA). This EUA will remain  in effect (meaning this test can be used) for the duration of the COVID-19 declaration under Section 564(b)(1) of the Act, 21 U.S.C.section 360bbb-3(b)(1), unless the authorization is terminated  or revoked sooner.       Influenza A by PCR NEGATIVE NEGATIVE Final   Influenza B by PCR NEGATIVE NEGATIVE Final    Comment: (NOTE) The Xpert Xpress SARS-CoV-2/FLU/RSV plus assay is intended as an aid in the diagnosis of influenza from Nasopharyngeal swab specimens and should not be used as a sole basis for treatment.  Nasal washings and aspirates are unacceptable for Xpert Xpress SARS-CoV-2/FLU/RSV testing.  Fact Sheet for Patients: EntrepreneurPulse.com.au  Fact Sheet for Healthcare Providers: IncredibleEmployment.be  This test is not yet approved or cleared by the [place] FDA and has been authorized for detection and/or diagnosis of SARS-CoV-2 by FDA under an Emergency Use Authorization (EUA). This EUA will remain in effect (meaning this test can be used) for the duration of the COVID-19 declaration under Section 564(b)(1) of  the Act, 21 U.S.C. section 360bbb-3(b)(1), unless the authorization is terminated or revoked.  Performed at [facility], [place] [address] [place], [place] [address]   Culture, blood (routine x 2)     Status: None (Preliminary result)   Collection Time: 05/07/21 11:33 PM   Specimen: BLOOD  Result Value Ref Range Status   Specimen Description   Final    BLOOD RIGHT ARM Performed at [place] [address], [place], [place] [address]    Special Requests   Final    BOTTLES DRAWN AEROBIC AND ANAEROBIC Blood Culture adequate volume Performed at [place] [address] [place], [place] [address]    Culture   Final    NO GROWTH 1 DAY Performed at [facility], [place] [address], [place], [place] [address]    Report Status PENDING  Incomplete  Culture, blood (routine x 2)     Status: None (Preliminary result)   Collection Time: 05/08/21  8:31 AM   Specimen: BLOOD  Result Value Ref Range Status   Specimen Description BLOOD LEFT ANTECUBITAL  Final   Special Requests   Final    BOTTLES DRAWN AEROBIC AND ANAEROBIC Blood Culture results may not be optimal due to an inadequate volume of blood received in culture bottles   Culture   Final    NO GROWTH 1 DAY Performed at [facility], [place] [address] [place], [place] [address]    Report Status PENDING  Incomplete  MRSA Next Gen by PCR, Nasal     Status: None   Collection Time: 05/08/21  8:34 AM   Specimen: Nasal Mucosa; Nasal Swab  Result Value Ref Range Status   MRSA by PCR Next Gen NOT DETECTED NOT DETECTED Final    Comment: (NOTE) The GeneXpert MRSA Assay (FDA approved for NASAL specimens only), is one component of a comprehensive MRSA colonization surveillance program. It is not intended to diagnose MRSA infection nor to guide or monitor treatment for MRSA infections. Test performance is not FDA approved in patients less than 5 years old. Performed at  [facility], [place] [address] [place], [place] [address]      Labs: BNP (last 3 results) Recent Labs    05/07/21 1656  BNP >5,277.8*   Basic Metabolic Panel: Recent Labs  Lab 05/07/21 1656 05/08/21 0831 05/09/21 0051 05/10/21 0018  NA 140 137 133* 135  K 4.5 5.1 3.2* 3.4*  CL 99 98 96* 96*  CO2 27 28 31 29   GLUCOSE 95 85 106* 85  BUN 19 22 7* <5*  CREATININE 7.38* 8.08* 3.64* 2.62*  CALCIUM 12.5* 11.6* 10.3 10.0  MG 2.0  --  1.8  --    Liver Function Tests: No results for input(s): AST, ALT, ALKPHOS, BILITOT, PROT, ALBUMIN in the last 168 hours. No results for input(s): LIPASE, AMYLASE in the last 168 hours. No results for input(s): AMMONIA in the last 168 hours. CBC: Recent Labs  Lab 05/07/21 1851 05/08/21  0831 05/09/21 0051  WBC 5.8 4.7 4.9  NEUTROABS 3.9  --  3.2  HGB 9.2* 8.7* 8.4*  HCT 29.6* 27.8* 26.1*  MCV 106.9* 103.3* 102.8*  PLT 185 165 176   Cardiac Enzymes: No results for input(s): CKTOTAL, CKMB, CKMBINDEX, TROPONINI in the last 168 hours. BNP: Invalid input(s): POCBNP CBG: No results for input(s): GLUCAP in the last 168 hours. D-Dimer No results for input(s): DDIMER in the last 72 hours. Hgb A1c No results for input(s): HGBA1C in the last 72 hours. Lipid Profile No results for input(s): CHOL, HDL, LDLCALC, TRIG, CHOLHDL, LDLDIRECT in the last 72 hours. Thyroid function studies No results for input(s): TSH, T4TOTAL, T3FREE, THYROIDAB in the last 72 hours.  Invalid input(s): FREET3 Anemia work up No results for input(s): VITAMINB12, FOLATE, FERRITIN, TIBC, IRON, RETICCTPCT in the last 72 hours. Urinalysis No results found for: COLORURINE, APPEARANCEUR, [place], [place], GLUCOSEU, [place], [place], [place], PROTEINUR, UROBILINOGEN, NITRITE, LEUKOCYTESUR Sepsis Labs Invalid input(s): PROCALCITONIN,  WBC,  LACTICIDVEN Microbiology Recent Results (from the past 240 hour(s))  Resp Panel by RT-PCR (Flu A&B, Covid) Nasopharyngeal  Swab     Status: None   Collection Time: 05/07/21  4:57 PM   Specimen: Nasopharyngeal Swab; Nasopharyngeal(NP) swabs in vial transport medium  Result Value Ref Range Status   SARS Coronavirus 2 by RT PCR NEGATIVE NEGATIVE Final    Comment: (NOTE) SARS-CoV-2 target nucleic acids are NOT DETECTED.  The SARS-CoV-2 RNA is generally detectable in upper respiratory specimens during the acute phase of infection. The lowest concentration of SARS-CoV-2 viral copies this assay can detect is 138 copies/mL. A negative result does not preclude SARS-Cov-2 infection and should not be used as the sole basis for treatment or other patient management decisions. A negative result may occur with  improper specimen collection/handling, submission of specimen other than nasopharyngeal swab, presence of viral mutation(s) within the areas targeted by this assay, and inadequate number of viral copies(<138 copies/mL). A negative result must be combined with clinical observations, patient history, and epidemiological information. The expected result is Negative.  Fact Sheet for Patients:  EntrepreneurPulse.com.au  Fact Sheet for Healthcare Providers:  IncredibleEmployment.be  This test is no t yet approved or cleared by the [place] FDA and  has been authorized for detection and/or diagnosis of SARS-CoV-2 by FDA under an Emergency Use Authorization (EUA). This EUA will remain  in effect (meaning this test can be used) for the duration of the COVID-19 declaration under Section 564(b)(1) of the Act, 21 U.S.C.section 360bbb-3(b)(1), unless the authorization is terminated  or revoked sooner.       Influenza A by PCR NEGATIVE NEGATIVE Final   Influenza B by PCR NEGATIVE NEGATIVE Final    Comment: (NOTE) The Xpert Xpress SARS-CoV-2/FLU/RSV plus assay is intended as an aid in the diagnosis of influenza from Nasopharyngeal swab specimens and should not be used as a sole  basis for treatment. Nasal washings and aspirates are unacceptable for Xpert Xpress SARS-CoV-2/FLU/RSV testing.  Fact Sheet for Patients: EntrepreneurPulse.com.au  Fact Sheet for Healthcare Providers: IncredibleEmployment.be  This test is not yet approved or cleared by the [place] FDA and has been authorized for detection and/or diagnosis of SARS-CoV-2 by FDA under an Emergency Use Authorization (EUA). This EUA will remain in effect (meaning this test can be used) for the duration of the COVID-19 declaration under Section 564(b)(1) of the Act, 21 U.S.C. section 360bbb-3(b)(1), unless the authorization is terminated or revoked.  Performed at [facility], [place]  [address] [place], [place] [address]   Culture, blood (routine x 2)     Status: None (Preliminary result)   Collection Time: 05/07/21 11:33 PM   Specimen: BLOOD  Result Value Ref Range Status   Specimen Description   Final    BLOOD RIGHT ARM Performed at [place] [address] [place], [place] [address]    Special Requests   Final    BOTTLES DRAWN AEROBIC AND ANAEROBIC Blood Culture adequate volume Performed at [facility] [address], [place], [place] [address]    Culture   Final    NO GROWTH 1 DAY Performed at [facility], [place] [address], [place], [place] [address]    Report Status PENDING  Incomplete  Culture, blood (routine x 2)     Status: None (Preliminary result)   Collection Time: 05/08/21  8:31 AM   Specimen: BLOOD  Result Value Ref Range Status   Specimen Description BLOOD LEFT ANTECUBITAL  Final   Special Requests   Final    BOTTLES DRAWN AEROBIC AND ANAEROBIC Blood Culture results may not be optimal due to an inadequate volume of blood received in culture bottles   Culture   Final    NO GROWTH 1 DAY Performed at [facility], [place] [address], [place], [place] [address]    Report  Status PENDING  Incomplete  MRSA Next Gen by PCR, Nasal     Status: None   Collection Time: 05/08/21  8:34 AM   Specimen: Nasal Mucosa; Nasal Swab  Result Value Ref Range Status   MRSA by PCR Next Gen NOT DETECTED NOT DETECTED Final    Comment: (NOTE) The GeneXpert MRSA Assay (FDA approved for NASAL specimens only), is one component of a comprehensive MRSA colonization surveillance program. It is not intended to diagnose MRSA infection nor to guide or monitor treatment for MRSA infections. Test performance is not FDA approved in patients less than 20 years old. Performed at [facility], [place] [address], [place], [place] [address]      Time coordinating discharge: 35 minutes  SIGNED:   Aline August, MD  [facility] 05/10/2021, 9:07 AM

## 2021-05-10 NOTE — Progress Notes (Addendum)
[facility] Progress Note  Subjective: seen in room, no c/o, off of O2  Vitals:   05/09/21 2330 05/10/21 0418 05/10/21 0722 05/10/21 1055  BP: 107/68 (!) 102/55 116/69 117/65  Pulse: 76 71 78 80  Resp: 20 20 14    Temp: 98.1 F (36.7 C) 98.1 F (36.7 C) 97.9 F (36.6 C)   TempSrc: Oral Oral Oral   SpO2: 98% 96% 94%   Weight:  44.4 kg    Height:        Exam:  alert, nad   no jvd  Chest cta bilat  Cor reg no RG  Abd soft ntnd no ascites   Ext no LE edema   Alert, NF, ox3   RUA AVF+bruit     Home meds  include lipitor, asa, Cozaar, lopressor 25 bid, protonix, velphoro ac tid      OP HD: [facility] TTS   3.5h  350/800  48kg  2/2 bath  Hep 2000 RUA AVF  - sensipar 30 tiw  - mircera 75 q2 last 5/11       Assessment/ Plan: Acute on chronic resp failure: d/t pulm edema, vol overload. As below.  HTN/ volume - vol overload w/ pleural effusions/ pulm edema. HD here x 2 w/ resolution of symptoms/ vol overload. Will have new edw on dc around 44.5kg.  Hypercalcemia - ca 10.3 today, better. Her VDRA was stopped in June due to high Ca. PTH recently was high at 790 and sensipar was resumed recently w/ HD tiw. Ca levels have been mostly high in the 10- 11 range for the last couple of years. ESRD - on HD TTS.  HD yesterday, under dry wt 3.5kg now and looks much better HTN - cont meds MBD ckd - see above about Ca, cont velphoro as binder and sensipar w/ hd Anemia ckd - Hb 8- 9 here, will give esa darbe 60 ug weekly Dispo - stable for d/c from renal standpoint      Rob Aryahi Denzler 05/10/2021, 11:43 AM   Recent Labs  Lab 05/08/21 0831 05/09/21 0051 05/10/21 0018  K 5.1 3.2* 3.4*  BUN 22 7* <5*  CREATININE 8.08* 3.64* 2.62*  CALCIUM 11.6* 10.3 10.0  HGB 8.7* 8.4*  --     Inpatient medications:  atorvastatin  40 mg Oral q1800   Chlorhexidine Gluconate Cloth  6 each Topical Q0600   cinacalcet  30 mg Oral Q T,Th,Sa-HD   [START ON 05/15/2021] darbepoetin (ARANESP)  injection - DIALYSIS  60 mcg Intravenous Q Wed-HD   feeding supplement (NEPRO CARB STEADY)  237 mL Oral BID BM   heparin  5,000 Units Subcutaneous Q8H   hydrALAZINE  25 mg Oral Q8H   isosorbide mononitrate  30 mg Oral Daily   metoprolol succinate  100 mg Oral Daily   multivitamin  1 tablet Oral QHS    acetaminophen **OR** acetaminophen, ondansetron **OR** ondansetron (ZOFRAN) IV

## 2021-05-10 NOTE — Progress Notes (Signed)
Pt updated on discharges plans for today. Pt endorses she does not have consolidated care here and that she is in the process of moving "down here". CSM Neoma Laming notified to help make referrals.

## 2021-05-10 NOTE — Progress Notes (Signed)
Pt endorses that her working fistula on her right upper arm was established in 2012. LDA avatar updated to reflect such information.

## 2021-05-11 ENCOUNTER — Telehealth (HOSPITAL_COMMUNITY): Payer: Self-pay | Admitting: Nephrology

## 2021-05-11 NOTE — Telephone Encounter (Signed)
Transition of care contact from inpatient facility  Date of Discharge: 05/10/21 Date of Contact: 05/11/21 Method of contact: Phone  Attempted to contact patient to discuss transition of care from inpatient admission. Patient did not answer the phone. Unable to leave message, "mailbox is full."  Veneta Penton, PA-C [employer] Pager [phone]

## 2021-05-13 LAB — CULTURE, BLOOD (ROUTINE X 2)
Culture: NO GROWTH
Culture: NO GROWTH
Special Requests: ADEQUATE

## 2021-05-14 ENCOUNTER — Emergency Department (HOSPITAL_BASED_OUTPATIENT_CLINIC_OR_DEPARTMENT_OTHER): Payer: 59

## 2021-05-14 ENCOUNTER — Emergency Department (HOSPITAL_BASED_OUTPATIENT_CLINIC_OR_DEPARTMENT_OTHER)
Admission: EM | Admit: 2021-05-14 | Discharge: 2021-05-14 | Disposition: A | Discharge status: Home / Self Care | Payer: 59 | Attending: Emergency Medicine | Admitting: Emergency Medicine

## 2021-05-14 ENCOUNTER — Other Ambulatory Visit: Payer: Self-pay

## 2021-05-14 ENCOUNTER — Encounter (HOSPITAL_BASED_OUTPATIENT_CLINIC_OR_DEPARTMENT_OTHER): Payer: Self-pay

## 2021-05-14 DIAGNOSIS — M546 Pain in thoracic spine: Secondary | ICD-10-CM | POA: Insufficient documentation

## 2021-05-14 DIAGNOSIS — R52 Pain, unspecified: Secondary | ICD-10-CM

## 2021-05-14 DIAGNOSIS — Z7982 Long term (current) use of aspirin: Secondary | ICD-10-CM | POA: Insufficient documentation

## 2021-05-14 DIAGNOSIS — I5023 Acute on chronic systolic (congestive) heart failure: Secondary | ICD-10-CM | POA: Diagnosis not present

## 2021-05-14 DIAGNOSIS — I132 Hypertensive heart and chronic kidney disease with heart failure and with stage 5 chronic kidney disease, or end stage renal disease: Secondary | ICD-10-CM | POA: Diagnosis not present

## 2021-05-14 DIAGNOSIS — R519 Headache, unspecified: Secondary | ICD-10-CM | POA: Diagnosis not present

## 2021-05-14 DIAGNOSIS — N186 End stage renal disease: Secondary | ICD-10-CM | POA: Insufficient documentation

## 2021-05-14 DIAGNOSIS — Z79899 Other long term (current) drug therapy: Secondary | ICD-10-CM | POA: Diagnosis not present

## 2021-05-14 MED ORDER — OXYCODONE HCL 5 MG PO TABS
5.0000 mg | ORAL_TABLET | Freq: Once | ORAL | Status: AC
  Administered 2021-05-14: 5 mg via ORAL
  Filled 2021-05-14: qty 1

## 2021-05-14 MED ORDER — DICLOFENAC SODIUM 1 % EX GEL: 4.0000 g | Freq: Four times a day (QID) | CUTANEOUS | 0 refills | Status: DC

## 2021-05-14 MED ORDER — ACETAMINOPHEN 500 MG PO TABS
1000.0000 mg | ORAL_TABLET | Freq: Once | ORAL | Status: AC
  Administered 2021-05-14: 1000 mg via ORAL
  Filled 2021-05-14: qty 2
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
## 2021-05-14 NOTE — ED Notes (Signed)
See EDP assessment 

## 2021-05-14 NOTE — ED Notes (Signed)
Pt to Xray.
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
## 2021-05-14 NOTE — Discharge Instructions (Addendum)
  Also take tylenol 1000mg (2 extra strength) four times a day.
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
## 2021-06-03 DIAGNOSIS — E876 Hypokalemia: Secondary | ICD-10-CM | POA: Insufficient documentation

## 2021-06-03 DIAGNOSIS — D509 Iron deficiency anemia, unspecified: Secondary | ICD-10-CM | POA: Insufficient documentation

## 2021-06-14 ENCOUNTER — Ambulatory Visit: Payer: 59 | Admitting: Cardiovascular Disease

## 2021-06-17 ENCOUNTER — Encounter (HOSPITAL_BASED_OUTPATIENT_CLINIC_OR_DEPARTMENT_OTHER): Payer: Self-pay | Admitting: *Deleted

## 2021-06-17 ENCOUNTER — Other Ambulatory Visit: Payer: Self-pay

## 2021-06-17 ENCOUNTER — Emergency Department (HOSPITAL_BASED_OUTPATIENT_CLINIC_OR_DEPARTMENT_OTHER): Payer: 59

## 2021-06-17 ENCOUNTER — Emergency Department (HOSPITAL_BASED_OUTPATIENT_CLINIC_OR_DEPARTMENT_OTHER)
Admission: EM | Admit: 2021-06-17 | Discharge: 2021-06-17 | Disposition: A | Discharge status: Home / Self Care | Payer: 59 | Attending: Emergency Medicine | Admitting: Emergency Medicine

## 2021-06-17 DIAGNOSIS — N186 End stage renal disease: Secondary | ICD-10-CM | POA: Insufficient documentation

## 2021-06-17 DIAGNOSIS — I5023 Acute on chronic systolic (congestive) heart failure: Secondary | ICD-10-CM | POA: Diagnosis not present

## 2021-06-17 DIAGNOSIS — I132 Hypertensive heart and chronic kidney disease with heart failure and with stage 5 chronic kidney disease, or end stage renal disease: Secondary | ICD-10-CM | POA: Insufficient documentation

## 2021-06-17 DIAGNOSIS — Z79899 Other long term (current) drug therapy: Secondary | ICD-10-CM | POA: Diagnosis not present

## 2021-06-17 DIAGNOSIS — Z7982 Long term (current) use of aspirin: Secondary | ICD-10-CM | POA: Insufficient documentation

## 2021-06-17 DIAGNOSIS — R000 Tachycardia, unspecified: Secondary | ICD-10-CM | POA: Diagnosis not present

## 2021-06-17 DIAGNOSIS — Z992 Dependence on renal dialysis: Secondary | ICD-10-CM | POA: Diagnosis not present

## 2021-06-17 DIAGNOSIS — U071 COVID-19: Secondary | ICD-10-CM | POA: Diagnosis not present

## 2021-06-17 DIAGNOSIS — R059 Cough, unspecified: Secondary | ICD-10-CM | POA: Diagnosis present

## 2021-06-17 LAB — CBC WITH DIFFERENTIAL/PLATELET
Abs Immature Granulocytes: 0.02 10*3/uL (ref 0.00–0.07)
Basophils Absolute: 0 10*3/uL (ref 0.0–0.1)
Basophils Relative: 1 %
Eosinophils Absolute: 0 10*3/uL (ref 0.0–0.5)
Eosinophils Relative: 0 %
HCT: 36.4 % (ref 36.0–46.0)
Hemoglobin: 12.3 g/dL (ref 12.0–15.0)
Immature Granulocytes: 1 %
Lymphocytes Relative: 35 %
Lymphs Abs: 0.9 10*3/uL (ref 0.7–4.0)
MCH: 34.5 pg — ABNORMAL HIGH (ref 26.0–34.0)
MCHC: 33.8 g/dL (ref 30.0–36.0)
MCV: 102 fL — ABNORMAL HIGH (ref 80.0–100.0)
Monocytes Absolute: 0.4 10*3/uL (ref 0.1–1.0)
Monocytes Relative: 16 %
Neutro Abs: 1.2 10*3/uL — ABNORMAL LOW (ref 1.7–7.7)
Neutrophils Relative %: 47 %
Platelets: 125 10*3/uL — ABNORMAL LOW (ref 150–400)
RBC: 3.57 MIL/uL — ABNORMAL LOW (ref 3.87–5.11)
RDW: 16.4 % — ABNORMAL HIGH (ref 11.5–15.5)
WBC: 2.5 10*3/uL — ABNORMAL LOW (ref 4.0–10.5)
nRBC: 0 % (ref 0.0–0.2)

## 2021-06-17 LAB — COMPREHENSIVE METABOLIC PANEL
ALT: 7 U/L (ref 0–44)
AST: 19 U/L (ref 15–41)
Albumin: 3.2 g/dL — ABNORMAL LOW (ref 3.5–5.0)
Alkaline Phosphatase: 227 U/L — ABNORMAL HIGH (ref 38–126)
Anion gap: 14 (ref 5–15)
BUN: 27 mg/dL — ABNORMAL HIGH (ref 8–23)
CO2: 27 mmol/L (ref 22–32)
Calcium: 11.2 mg/dL — ABNORMAL HIGH (ref 8.9–10.3)
Chloride: 89 mmol/L — ABNORMAL LOW (ref 98–111)
Creatinine, Ser: 9.58 mg/dL — ABNORMAL HIGH (ref 0.44–1.00)
GFR, Estimated: 4 mL/min — ABNORMAL LOW (ref >60)
Glucose, Bld: 80 mg/dL (ref 70–99)
Potassium: 3.7 mmol/L (ref 3.5–5.1)
Sodium: 130 mmol/L — ABNORMAL LOW (ref 135–145)
Total Bilirubin: 1.2 mg/dL (ref 0.3–1.2)
Total Protein: 7.3 g/dL (ref 6.5–8.1)

## 2021-06-17 LAB — RESP PANEL BY RT-PCR (FLU A&B, COVID) ARPGX2
Influenza A by PCR: NEGATIVE
Influenza B by PCR: NEGATIVE
SARS Coronavirus 2 by RT PCR: POSITIVE — AB

## 2021-06-17 MED ORDER — SODIUM CHLORIDE 0.9 % IV BOLUS: 1000.0000 mL | Freq: Once | INTRAVENOUS | Status: DC

## 2021-06-17 MED ORDER — ACETAMINOPHEN 325 MG PO TABS
650.0000 mg | ORAL_TABLET | Freq: Once | ORAL | Status: AC
  Administered 2021-06-17: 650 mg via ORAL
  Filled 2021-06-17: qty 2

## 2021-06-17 MED ORDER — BENZONATATE 100 MG PO CAPS
100.0000 mg | ORAL_CAPSULE | Freq: Once | ORAL | Status: AC
  Administered 2021-06-17: 100 mg via ORAL
  Filled 2021-06-17: qty 1

## 2021-06-17 MED ORDER — BENZONATATE 100 MG PO CAPS: 100.0000 mg | ORAL_CAPSULE | Freq: Three times a day (TID) | ORAL | 0 refills | Status: DC

## 2021-06-17 MED ORDER — BEBTELOVIMAB 175 MG/2 ML IV (EUA)
175.0000 mg | Freq: Once | INTRAMUSCULAR | Status: AC
  Administered 2021-06-17: 175 mg via INTRAVENOUS
  Filled 2021-06-17: qty 2

## 2021-06-17 NOTE — ED Provider Notes (Signed)
Care assumed from [name], PA-C at shift change pending labs and CXR. See his note for full HPI.  In short, patient is a 60 year old female presents to the ED due to cough, rhinorrhea, and chills.  No sick contacts or known COVID exposures.  Patient is an ESRD patient.  No missed dialysis.  Plan from previous provider: if labs reassuring, patient may be discharged home with symptomatic treatment.   ED Course/Procedures   Clinical Course as of 06/17/21 2129  Mon Jun 17, 2021  1958 SARS Coronavirus 2 by RT PCR(!): POSITIVE [CA]    Clinical Course User Index [CA] Suzy Bouchard, PA-C   Results for orders placed or performed during the hospital encounter of 06/17/21 (from the past 24 hour(s))  Resp Panel by RT-PCR (Flu A&B, Covid) Nasopharyngeal Swab     Status: Abnormal   Collection Time: 06/17/21  6:35 PM   Specimen: Nasopharyngeal Swab; Nasopharyngeal(NP) swabs in vial transport medium  Result Value Ref Range   SARS Coronavirus 2 by RT PCR POSITIVE (A) NEGATIVE   Influenza A by PCR NEGATIVE NEGATIVE   Influenza B by PCR NEGATIVE NEGATIVE  CBC with Differential     Status: Abnormal   Collection Time: 06/17/21  7:05 PM  Result Value Ref Range   WBC 2.5 (L) 4.0 - 10.5 K/uL   RBC 3.57 (L) 3.87 - 5.11 MIL/uL   Hemoglobin 12.3 12.0 - 15.0 g/dL   HCT 36.4 36.0 - 46.0 %   MCV 102.0 (H) 80.0 - 100.0 fL   MCH 34.5 (H) 26.0 - 34.0 pg   MCHC 33.8 30.0 - 36.0 g/dL   RDW 16.4 (H) 11.5 - 15.5 %   Platelets 125 (L) 150 - 400 K/uL   nRBC 0.0 0.0 - 0.2 %   Neutrophils Relative % 47 %   Neutro Abs 1.2 (L) 1.7 - 7.7 K/uL   Lymphocytes Relative 35 %   Lymphs Abs 0.9 0.7 - 4.0 K/uL   Monocytes Relative 16 %   Monocytes Absolute 0.4 0.1 - 1.0 K/uL   Eosinophils Relative 0 %   Eosinophils Absolute 0.0 0.0 - 0.5 K/uL   Basophils Relative 1 %   Basophils Absolute 0.0 0.0 - 0.1 K/uL   Immature Granulocytes 1 %   Abs Immature Granulocytes 0.02 0.00 - 0.07 K/uL  Comprehensive  metabolic panel     Status: Abnormal   Collection Time: 06/17/21  7:05 PM  Result Value Ref Range   Sodium 130 (L) 135 - 145 mmol/L   Potassium 3.7 3.5 - 5.1 mmol/L   Chloride 89 (L) 98 - 111 mmol/L   CO2 27 22 - 32 mmol/L   Glucose, Bld 80 70 - 99 mg/dL   BUN 27 (H) 8 - 23 mg/dL   Creatinine, Ser 9.58 (H) 0.44 - 1.00 mg/dL   Calcium 11.2 (H) 8.9 - 10.3 mg/dL   Total Protein 7.3 6.5 - 8.1 g/dL   Albumin 3.2 (L) 3.5 - 5.0 g/dL   AST 19 15 - 41 U/L   ALT 7 0 - 44 U/L   Alkaline Phosphatase 227 (H) 38 - 126 U/L   Total Bilirubin 1.2 0.3 - 1.2 mg/dL   GFR, Estimated 4 (L) >60 mL/min   Anion gap 14 5 - 15     Procedures  MDM  Care assumed from [place], PA-C at shift change pending labs and CXR. See his note for full MDM.  60 year old female presents to the ED due to cough, rhinorrhea,  and chills.  Upon arrival, patient borderline febrile and tachycardic.  No episodes of hypoxia.  Patient denies chest pain and shortness of breath. Low suspicion for PE/DVT. Physical exam reassuring.  Lungs clear to auscultation bilaterally.  Abdomen soft, nondistended, nontender.  No meningismus to suggest meningitis.  I have personally reviewed labs from previous provider.  CBC significant for leukopenia 2.5.  Thrombocytopenia at 125.  CMP significant for hyponatremia at 130.  Creatinine at 9.58 and BUN at 27 likely due to ESRD.  Alk phos elevated to 27.  Normal AST and ALT.  No right upper quadrant abdominal pain. CXR personally reviewed which demonstrates: IMPRESSION:  Cardiomegaly with small pleural effusions and mild airspace disease  at the bases which may be due to atelectasis or pneumonia   COVID positive. Suspect symptoms related to COVID-19 infection. Low suspicion for bacterial pneumonia. Suspect cardiomegaly related to both ESRD and history of CHF. No shortness of breath or lower extremity edema to suggest CHF exacerbation. Discussed with Cynthia Bray with pharmacy about COVID treatment  options. Cynthia Bray recommended [name] infusion. Infusion given here in the ED. Patient discharged with symptomatic treatment.  Quarantine guidelines discussed with patient.  Advised patient to follow-up with PCP if symptoms not improved within the next week. Strict ED precautions discussed with patient. Patient states understanding and agrees to plan. Patient discharged home in no acute distress and stable vitals.  Cynthia Bray was evaluated in Emergency Department on 06/17/2021 for the symptoms described in the history of present illness. She was evaluated in the context of the global COVID-19 pandemic, which necessitated consideration that the patient might be at risk for infection with the SARS-CoV-2 virus that causes COVID-19. Institutional protocols and algorithms that pertain to the evaluation of patients at risk for COVID-19 are in a state of rapid change based on information released by regulatory bodies including the CDC and federal and state organizations. These policies and algorithms were followed during the patient's care in the ED.        Suzy Bouchard, PA-C 06/17/21 2132    Deno Etienne, DO 06/17/21 2147

## 2021-06-17 NOTE — ED Provider Notes (Signed)
[facility] EMERGENCY DEPARTMENT Provider Note   CSN: [id number] Arrival date & time: 06/17/21  1648     History Chief Complaint  Patient presents with   Cough    Cynthia Bray is a 60 y.o. female presents with 4 days of cough, runny nose, chills.  She has a significant medical history of ESRD on dialysis, as well as hypertension.  She denies fever, nausea, vomiting, constipation, diarrhea, chest pain, shortness of breath.  Patient denies recent sick contacts.  She has not tested herself for COVID. She reports she has not missed dialysis.    Cough Associated symptoms: chills and rhinorrhea       Past Medical History:  Diagnosis Date   CHF (congestive heart failure) (HCC)    EF 25%   ESRD (end stage renal disease) ([place])    Hypertension    Renal disorder     Patient Active Problem List   Diagnosis Date Noted   Protein-calorie malnutrition, severe 05/09/2021   Pulmonary edema 05/09/2021   Acute on chronic systolic CHF (congestive heart failure) ([place]) 05/07/2021   ESRD (end stage renal disease) ([place]) 05/07/2021   Hypercalcemia 05/07/2021   HTN (hypertension) 05/07/2021   Tachycardia 05/07/2021    Past Surgical History:  Procedure Laterality Date   AV FISTULA PLACEMENT       OB History   No obstetric history on file.     Family History  Problem Relation Age of Onset   Kidney disease Mother     Social History   Tobacco Use   Smoking status: Never   Smokeless tobacco: Never  Vaping Use   Vaping Use: Never used  Substance Use Topics   Alcohol use: Never   Drug use: Never    Home Medications Prior to Admission medications   Medication Sig Start Date End Date Taking? Authorizing Provider  aspirin EC 81 MG tablet Take 81 mg by mouth daily. Swallow whole.    [provider]  atorvastatin (LIPITOR) 40 MG tablet Take 40 mg by mouth every evening. 04/20/21   [provider]  cinacalcet (SENSIPAR) 30 MG tablet Take 1 tablet (30 mg  total) by mouth Every Tuesday,Thursday,and Saturday with dialysis. 05/11/21   Aline August, MD  diclofenac Sodium (VOLTAREN) 1 % GEL Apply 4 g topically 4 (four) times daily. 05/14/21   Deno Etienne, DO  hydrALAZINE (APRESOLINE) 25 MG tablet Take 1 tablet (25 mg total) by mouth every 8 (eight) hours. 05/10/21   Aline August, MD  isosorbide mononitrate (IMDUR) 30 MG 24 hr tablet Take 1 tablet (30 mg total) by mouth daily. 05/10/21   Aline August, MD  lidocaine-prilocaine (EMLA) cream Apply 1 application topically See admin instructions. Apply small amount to access site 1-2 hours before dialysis. 05/02/21   [provider]  metoprolol succinate (TOPROL-XL) 100 MG 24 hr tablet Take 1 tablet (100 mg total) by mouth daily. Take with or immediately following a meal. 05/10/21   Aline August, MD  multivitamin (RENA-VIT) TABS tablet Take 1 tablet by mouth at bedtime. 05/10/21   Aline August, MD  pantoprazole (PROTONIX) 40 MG tablet Take 40 mg by mouth 2 (two) times daily. 04/20/21   [provider]  VELPHORO 500 MG chewable tablet Chew 500 mg by mouth 3 (three) times daily. 05/03/21   [provider]    Allergies    Lisinopril and Penicillin g  Review of Systems   Review of Systems  Constitutional:  Positive for chills.  HENT:  Positive  for rhinorrhea.   Respiratory:  Positive for cough.   All other systems reviewed and are negative.  Physical Exam Updated Vital Signs BP (!) 171/95 (BP Location: Left Arm)   Pulse (!) 109   Temp 99.8 F (37.7 C) (Oral)   Resp 18   Ht 5\' 8"  (1.727 m)   Wt 46.4 kg   SpO2 100%   BMI 15.57 kg/m   Physical Exam Vitals and nursing note reviewed.  Constitutional:      General: She is not in acute distress.    Appearance: Normal appearance.  HENT:     Head: Normocephalic and atraumatic.     Mouth/Throat:     Mouth: Mucous membranes are moist.     Pharynx: Oropharynx is clear. No oropharyngeal exudate.  Eyes:     General:         Right eye: No discharge.        Left eye: No discharge.  Cardiovascular:     Rate and Rhythm: Regular rhythm. Tachycardia present.     Heart sounds: No murmur heard.   No friction rub. No gallop.  Pulmonary:     Effort: Pulmonary effort is normal.     Breath sounds: Normal breath sounds.  Abdominal:     General: Bowel sounds are normal.     Palpations: Abdomen is soft.  Skin:    General: Skin is warm and dry.     Capillary Refill: Capillary refill takes less than 2 seconds.  Neurological:     Mental Status: She is alert and oriented to person, place, and time.  Psychiatric:        Mood and Affect: Mood normal.        Behavior: Behavior normal.    ED Results / Procedures / Treatments   Labs (all labs ordered are listed, but only abnormal results are displayed) Labs Reviewed  RESP PANEL BY RT-PCR (FLU A&B, COVID) [id number]  CBC WITH DIFFERENTIAL/PLATELET  COMPREHENSIVE METABOLIC PANEL    EKG None  Radiology DG Chest 2 View  Result Date: 06/17/2021 CLINICAL DATA:  Cough EXAM: CHEST - 2 VIEW COMPARISON:  05/07/2021 FINDINGS: Cardiomegaly. Small bilateral effusions with mild left greater than right basilar airspace disease. Bandlike scarring or atelectasis in the right lower lung. No pneumothorax. IMPRESSION: Cardiomegaly with small pleural effusions and mild airspace disease at the bases which may be due to atelectasis or pneumonia Electronically Signed   By: Donavan Foil M.D.   On: 06/17/2021 19:25    Procedures Procedures   Medications Ordered in ED Medications  acetaminophen (TYLENOL) tablet 650 mg (650 mg Oral Given 06/17/21 1703)  benzonatate (TESSALON) capsule 100 mg (100 mg Oral Given 06/17/21 1916)    ED Course  I have reviewed the triage vital signs and the nursing notes.  Pertinent labs & imaging results that were available during my care of the patient were reviewed by me and considered in my medical decision making (see chart for details).    MDM  Rules/Calculators/A&P                         Patient is overall well appearing in the setting of her ESRD, CHF. She is cachectic and appear malnourished but denies any symptoms other than cough, chills, rhinorrhea today. Marginally febrile with some tachycardia. Given tylenol for fever. Given tessalon for cough. Ordered basic labwork and CXR to rule out systemic worsening of other chronic conditions. Favor likely viral URI and supportive  care, but will depend on results of her labwork.  7:28 PM Care of Tagan Bartram transferred to [facility] and Dr. Tyrone Nine at the end of my shift as the patient will require reassessment once labs/imaging have resulted. Patient presentation, ED course, and plan of care discussed with review of all pertinent labs and imaging. Please see his/her note for further details regarding further ED course and disposition. Plan at time of handoff is make sure that patient is not having any electrolyte abnormalities or other signs of systemic illness and discharge with symptomatic control for her cough. This may be altered or completely changed at the discretion of the oncoming team pending results of further workup.  Final Clinical Impression(s) / ED Diagnoses Final diagnoses:  None    Rx / DC Orders ED Discharge Orders     None        Anselmo Pickler, PA-C 06/17/21 [place], [place], DO 06/17/21 2129

## 2021-06-17 NOTE — ED Notes (Signed)
Patient transported to XR. 

## 2021-06-17 NOTE — ED Notes (Signed)
messaged provider re: giving a liter of fluid due to pt being a dialysis pt

## 2021-06-17 NOTE — ED Triage Notes (Signed)
Cough, chills and runny nose x 2 days.

## 2021-06-17 NOTE — Discharge Instructions (Addendum)
It was a pleasure taking care of you today.  As discussed, your COVID test was positive.  I suspect your symptoms are related to COVID infection.  You were given an infusion for COVID here in the ED.  I am sending you home with cough medication.  Take as needed.  You must quarantine for 5 days since symptom onset. Follow-up with PCP if symptoms do not improve within the next week. Return to the ED for new or worsening symptoms.

## 2021-06-19 DIAGNOSIS — I519 Heart disease, unspecified: Secondary | ICD-10-CM | POA: Insufficient documentation

## 2021-07-11 DIAGNOSIS — Z23 Encounter for immunization: Secondary | ICD-10-CM | POA: Insufficient documentation

## 2021-09-11 ENCOUNTER — Ambulatory Visit: Payer: 59 | Admitting: Cardiovascular Disease

## 2021-09-16 ENCOUNTER — Encounter: Payer: Self-pay | Admitting: Cardiovascular Disease

## 2021-09-17 ENCOUNTER — Other Ambulatory Visit: Payer: Self-pay | Admitting: Nephrology

## 2021-09-17 ENCOUNTER — Other Ambulatory Visit (HOSPITAL_COMMUNITY): Payer: Self-pay | Admitting: Nephrology

## 2021-09-17 DIAGNOSIS — D351 Benign neoplasm of parathyroid gland: Secondary | ICD-10-CM

## 2021-10-29 ENCOUNTER — Encounter (HOSPITAL_COMMUNITY): Payer: 59

## 2021-11-05 ENCOUNTER — Encounter (HOSPITAL_COMMUNITY): Payer: Self-pay

## 2021-11-05 ENCOUNTER — Other Ambulatory Visit (HOSPITAL_COMMUNITY): Payer: 59

## 2021-11-26 ENCOUNTER — Other Ambulatory Visit: Payer: Self-pay | Admitting: Surgery

## 2021-11-26 DIAGNOSIS — N2581 Secondary hyperparathyroidism of renal origin: Secondary | ICD-10-CM

## 2021-12-05 ENCOUNTER — Other Ambulatory Visit: Payer: 59

## 2021-12-10 ENCOUNTER — Other Ambulatory Visit: Payer: 59

## 2021-12-17 ENCOUNTER — Ambulatory Visit
Admission: RE | Admit: 2021-12-17 | Discharge: 2021-12-17 | Disposition: A | Discharge status: Home / Self Care | Payer: 59 | Source: Ambulatory Visit | Attending: Surgery | Admitting: Surgery

## 2021-12-17 DIAGNOSIS — N2581 Secondary hyperparathyroidism of renal origin: Secondary | ICD-10-CM

## 2021-12-18 NOTE — Progress Notes (Signed)
USN shows no thyroid abnormalities.  At least one enlarged parathyroid gland is identified. ? ?Will proceed with scheduling of her procedure in near future. ? ?Armandina Gemma, MD ?[facility] ?A [facility] practice ?Office: [phone] ? ?

## 2021-12-25 ENCOUNTER — Ambulatory Visit: Payer: Self-pay | Admitting: Surgery

## 2021-12-25 NOTE — H&P (Signed)
? ? ? ?REFERRING PHYSICIAN: Wendie Agreste, MD ? ?PROVIDER: Daniyah Fohl Charlotta Newton, MD ? ?Chief Complaint: New Consultation (Secondary hyperparathyroidism of renal origin) ? ? ?History of Present Illness: ? ?Patient is referred by Dr. Otelia Santee for surgical evaluation and management of secondary hyperparathyroidism of renal origin. Patient has been on hemodialysis for approximately 6 years. She dialyzes at the kidney center in [facility] on Mondays Wednesdays and Fridays. She currently has an access in the right upper extremity. Recent laboratories show a calcium level of 9.9, phosphorus level of 5.7, and a parathyroid hormone level of 2327. Patient denies any complications. She has had no heterotopic ossification. She has no skin ulceration. She denies fatigue. She is accompanied today by her son. Patient has had no prior head or neck surgery. She has been on Sensipar. ? ?Review of Systems: ?A complete review of systems was obtained from the patient. I have reviewed this information and discussed as appropriate with the patient. See HPI as well for other ROS. ? ?Review of Systems  ?Constitutional: Negative.  ?HENT: Negative.  ?Eyes: Negative.  ?Respiratory: Negative.  ?Cardiovascular: Negative.  ?Gastrointestinal: Negative.  ?Genitourinary: Negative.  ?Musculoskeletal: Negative.  ?Skin: Negative.  ?Neurological: Negative.  ?Endo/Heme/Allergies: Negative.  ?Psychiatric/Behavioral: Negative.  ? ? ?Medical History: ?History reviewed. No pertinent past medical history. ? ?Patient Active Problem List  ?Diagnosis  ? Secondary hyperparathyroidism of renal origin (CMS-HCC)  ? ?History reviewed. No pertinent surgical history.  ? ?Allergies  ?Allergen Reactions  ? Ace Inhibitors Swelling and Other (See Comments)  ?Angioedema--Lisinopril ?Patient reports facial, lip, bilateral arm edema with lisinopril.  ?Angioedema ? ? ?Current Outpatient Medications on File Prior to Visit  ?Medication Sig Dispense Refill  ? atenolol  (TENORMIN) 12.5 mg capsule  ? cefdinir (OMNICEF) 300 mg capsule Take 300 mg by mouth 2 (two) times daily  ? ?No current facility-administered medications on file prior to visit.  ? ?Family History  ?Problem Relation Age of Onset  ? Hyperlipidemia (Elevated cholesterol) Mother  ? High blood pressure (Hypertension) Mother  ? Diabetes Brother  ? ? ?Social History  ? ?Tobacco Use  ?Smoking Status Never  ?Smokeless Tobacco Never  ? ? ?Social History  ? ?Socioeconomic History  ? Marital status: Widowed  ?Tobacco Use  ? Smoking status: Never  ? Smokeless tobacco: Never  ?Substance and Sexual Activity  ? Alcohol use: Never  ? Drug use: Never  ? ?Objective:  ? ?Vitals:  ?BP: 130/72  ?Pulse: 102  ?Temp: 36.5 ?C (97.7 ?F)  ?SpO2: 98%  ?Weight: 52.6 kg (116 lb)  ?Height: 172.7 cm ('5\' 8"'$ )  ? ?Body mass index is 17.64 kg/m?. ? ?Physical Exam  ? ?GENERAL APPEARANCE ?Development: normal ?Nutritional status: normal ?Gross deformities: none ? ?SKIN ?Rash, lesions, ulcers: none ?Induration, erythema: none ?Nodules: none palpable ? ?EYES ?Conjunctiva and lids: normal ?Pupils: equal and reactive ?Iris: normal bilaterally ? ?EARS, NOSE, MOUTH, THROAT ?External ears: no lesion or deformity ?External nose: no lesion or deformity ?Hearing: grossly normal ?Due to Covid-19 pandemic, patient is wearing a mask. ? ?NECK ?Symmetric: yes ?Trachea: midline ?Thyroid: no palpable nodules in the thyroid bed ? ?CHEST ?Respiratory effort: normal ?Retraction or accessory muscle use: no ?Breath sounds: normal bilaterally ?Rales, rhonchi, wheeze: none ? ?CARDIOVASCULAR ?Auscultation: regular rhythm, normal rate ?Murmurs: none ?Pulses: radial pulse 2+ palpable ?Lower extremity edema: none ? ?MUSCULOSKELETAL ?Station and gait: normal ?Digits and nails: no clubbing or cyanosis ?Muscle strength: grossly normal all extremities ?Range of motion: grossly normal all extremities ?  Deformity: Arteriovenous fistula right upper arm ? ?LYMPHATIC ?Cervical: none  palpable ?Supraclavicular: none palpable ? ?PSYCHIATRIC ?Oriented to person, place, and time: yes ?Mood and affect: normal for situation ?Judgment and insight: appropriate for situation ? ?Assessment and Plan:  ? ?Secondary hyperparathyroidism of renal origin (CMS-HCC) ? ? ?Patient is referred by her nephrologist for surgical evaluation for management of secondary hyperparathyroidism. Patient is accompanied by her son. ? ?Patient has biochemical evidence of secondary hyperparathyroidism. She has failed Sensipar. She has not had any complications. Today we discussed proceeding with total parathyroidectomy with autotransplantation to the forearm. We discussed the size and location of the surgical incisions. We discussed the hospital stay to be anticipated. We discussed her postoperative recovery. We discussed the potential for recurrent disease in the transplanted parathyroid tissue. The patient and her son understand and agree to proceed. ? ?Prior to surgery, I would like to obtain an ultrasound examination of the neck to evaluate for concurrent thyroid disease. We will make arrangements for the study in the near future. ? ?We will need to coordinate her care with the nephrologist and the hospital medical service. We will contact the patient from our scheduling department in order to pick a date for surgery in the near future.  ? ?Armandina Gemma, MD ?[facility] ?A [facility] practice ?Office: [phone] ? ? ?

## 2022-01-08 DIAGNOSIS — R52 Pain, unspecified: Secondary | ICD-10-CM | POA: Insufficient documentation

## 2022-02-06 ENCOUNTER — Other Ambulatory Visit (HOSPITAL_BASED_OUTPATIENT_CLINIC_OR_DEPARTMENT_OTHER): Payer: Self-pay

## 2022-02-06 ENCOUNTER — Encounter (HOSPITAL_BASED_OUTPATIENT_CLINIC_OR_DEPARTMENT_OTHER): Payer: Self-pay

## 2022-02-06 ENCOUNTER — Emergency Department (HOSPITAL_BASED_OUTPATIENT_CLINIC_OR_DEPARTMENT_OTHER)
Admission: EM | Admit: 2022-02-06 | Discharge: 2022-02-06 | Disposition: A | Discharge status: Home / Self Care | Payer: Medicare Other | Attending: Emergency Medicine | Admitting: Emergency Medicine

## 2022-02-06 ENCOUNTER — Other Ambulatory Visit: Payer: Self-pay

## 2022-02-06 ENCOUNTER — Emergency Department (HOSPITAL_BASED_OUTPATIENT_CLINIC_OR_DEPARTMENT_OTHER): Payer: Medicare Other

## 2022-02-06 DIAGNOSIS — M25512 Pain in left shoulder: Secondary | ICD-10-CM | POA: Diagnosis present

## 2022-02-06 DIAGNOSIS — M79601 Pain in right arm: Secondary | ICD-10-CM | POA: Insufficient documentation

## 2022-02-06 DIAGNOSIS — Z7982 Long term (current) use of aspirin: Secondary | ICD-10-CM | POA: Diagnosis not present

## 2022-02-06 DIAGNOSIS — R937 Abnormal findings on diagnostic imaging of other parts of musculoskeletal system: Secondary | ICD-10-CM | POA: Diagnosis not present

## 2022-02-06 DIAGNOSIS — R936 Abnormal findings on diagnostic imaging of limbs: Secondary | ICD-10-CM

## 2022-02-06 MED ORDER — IBUPROFEN 400 MG PO TABS
600.0000 mg | ORAL_TABLET | Freq: Once | ORAL | Status: AC
  Administered 2022-02-06: 600 mg via ORAL
  Filled 2022-02-06: qty 1

## 2022-02-06 MED ORDER — IBUPROFEN 600 MG PO TABS
600.0000 mg | ORAL_TABLET | Freq: Four times a day (QID) | ORAL | 0 refills | Status: DC | PRN
  Filled 2022-02-06: qty 30, 8d supply, fill #0

## 2022-02-06 NOTE — ED Triage Notes (Signed)
Pt c/o L shoulder pain and muscle spasms x 2 days.  ?

## 2022-02-06 NOTE — Discharge Instructions (Addendum)
Please call and make an appointment with orthopedic specialist in the next week for left-sided shoulder pain and abnormal x-ray results ? ?Xray left shoulder results: ? ?Apparent loose bodies within the inferior glenohumeral joint space. ?Possible posterior humeral head bone loss/cortical defect on ?axillary view, age indeterminate. This may be secondary to age ?indeterminate trauma aor less likely an inflammatory arthropathy. ?

## 2022-02-06 NOTE — ED Provider Notes (Signed)
?[facility] EMERGENCY DEPARTMENT ?Provider Note ? ? ?CSN: [id number] ?Arrival date & time: 02/06/22  1132 ? ?  ? ?History ? ?Chief Complaint  ?Patient presents with  ? Arm Pain  ? ? ?Cynthia Bray is a 61 y.o. female. ? ?Patient is a 61 year old female presenting for complaints of left-sided shoulder pain.  Patient admits to sided shoulder pain for the past 2 days.  Denies any falls or injuries.  States pain is worse with movement of the right arm and to palpation (points to inferior shoulder joint).  ? ?The history is provided by the patient. No language interpreter was used.  ?Arm Pain ? ? ?  ? ?Home Medications ?Prior to Admission medications   ?Medication Sig Start Date End Date Taking? Authorizing Provider  ?ibuprofen (ADVIL) 600 MG tablet Take 1 tablet (600 mg total) by mouth every 6 (six) hours as needed. 6/76/19  Yes Campbell Stall P, DO  ?aspirin EC 81 MG tablet Take 81 mg by mouth daily. Swallow whole.    [provider]  ?atorvastatin (LIPITOR) 40 MG tablet Take 40 mg by mouth every evening. 04/20/21   [provider]  ?benzonatate (TESSALON) 100 MG capsule Take 1 capsule (100 mg total) by mouth every 8 (eight) hours. 06/17/21   Suzy Bouchard, PA-C  ?cinacalcet (SENSIPAR) 30 MG tablet Take 1 tablet (30 mg total) by mouth Every Tuesday,Thursday,and Saturday with dialysis. 05/11/21   Aline August, MD  ?diclofenac Sodium (VOLTAREN) 1 % GEL Apply 4 g topically 4 (four) times daily. 05/14/21   Deno Etienne, DO  ?hydrALAZINE (APRESOLINE) 25 MG tablet Take 1 tablet (25 mg total) by mouth every 8 (eight) hours. 05/10/21   Aline August, MD  ?isosorbide mononitrate (IMDUR) 30 MG 24 hr tablet Take 1 tablet (30 mg total) by mouth daily. 05/10/21   Aline August, MD  ?lidocaine-prilocaine (EMLA) cream Apply 1 application topically See admin instructions. Apply small amount to access site 1-2 hours before dialysis. 05/02/21   [provider]  ?metoprolol succinate (TOPROL-XL) 100 MG 24  hr tablet Take 1 tablet (100 mg total) by mouth daily. Take with or immediately following a meal. 05/10/21   Aline August, MD  ?multivitamin (RENA-VIT) TABS tablet Take 1 tablet by mouth at bedtime. 05/10/21   Aline August, MD  ?pantoprazole (PROTONIX) 40 MG tablet Take 40 mg by mouth 2 (two) times daily. 04/20/21   [provider]  ?VELPHORO 500 MG chewable tablet Chew 500 mg by mouth 3 (three) times daily. 05/03/21   [provider]  ?   ? ?Allergies    ?Lisinopril and Penicillin g   ? ?Review of Systems   ?Review of Systems  ?Constitutional:  Negative for chills and fever.  ?Skin:  Negative for rash and wound.  ?Neurological:  Negative for weakness and numbness.  ? ?Physical Exam ?Updated Vital Signs ?BP (!) 147/77 (BP Location: Left Arm)   Pulse 89   Temp 98.3 ?F (36.8 ?C) (Oral)   Resp 16   Ht 5' 8.5" (1.74 m)   Wt 59.4 kg   SpO2 100%   BMI 19.63 kg/m?  ?Physical Exam ?Vitals and nursing note reviewed.  ?Constitutional:   ?   Appearance: Normal appearance.  ?HENT:  ?   Head: Normocephalic and atraumatic.  ?Cardiovascular:  ?   Rate and Rhythm: Normal rate and regular rhythm.  ?   Pulses:     ?     Radial pulses are 2+ on the right side  and 2+ on the left side.  ?Pulmonary:  ?   Effort: Pulmonary effort is normal. No respiratory distress.  ?Skin: ?   Capillary Refill: Capillary refill takes less than 2 seconds.  ?Neurological:  ?   General: No focal deficit present.  ?   Mental Status: She is alert.  ?   GCS: GCS eye subscore is 4. GCS verbal subscore is 5. GCS motor subscore is 6.  ?   Sensory: Sensation is intact.  ?   Motor: Motor function is intact.  ? ? ?ED Results / Procedures / Treatments   ?Labs ?(all labs ordered are listed, but only abnormal results are displayed) ?Labs Reviewed - No data to display ? ?EKG ?None ? ?Radiology ?DG Shoulder Left ? ?Result Date: 02/06/2022 ?CLINICAL DATA:  Left shoulder pain with movement for 3 days. No known injury. No prior surgery. EXAM: LEFT  SHOULDER - 2+ VIEW COMPARISON:  None. FINDINGS: Mild inferior glenoid degenerative osteophytosis. No significant glenohumeral joint space narrowing. There are 2 overlapping oval densities just inferior to the glenoid on frontal view measuring up to 15 mm in craniocaudal dimension, possible loose bodies. On axillary view there appears to be lucency and possible cortical defect versus erosion of the posterior humeral head. Minimal acromioclavicular joint space narrowing and peripheral osteophytosis. No acute fracture is seen. No dislocation. Mild calcification within the aortic arch. IMPRESSION: Apparent loose bodies within the inferior glenohumeral joint space. Possible posterior humeral head bone loss/cortical defect on axillary view, age indeterminate. This may be secondary to age indeterminate trauma aor less likely an inflammatory arthropathy. Electronically Signed   By: Yvonne Kendall M.D.   On: 02/06/2022 12:42   ? ?Procedures ?Procedures  ? ? ?Medications Ordered in ED ?Medications  ?ibuprofen (ADVIL) tablet 600 mg (has no administration in time range)  ? ? ?ED Course/ Medical Decision Making/ A&P ?  ?                        ?Medical Decision Making ?Amount and/or Complexity of Data Reviewed ?Radiology: ordered. ? ? ?Pt is a 61 -year-old female presenting for left-sided shoulder pain with movement and patient without injury.  Patient is alert and oriented x3, no acute distress, afebrile, stable vital signs.  Physical exam demonstrates tenderness to palpation of anterior inferior shoulder joint.  No tenderness of biceps tendon.  No clavicular tenderness.  No tenderness to palpation of scapula.  Arm neurovascularly intact.  Xray demonstrates Apparent loose bodies within the inferior glenohumeral joint space. ?Possible posterior humeral head bone loss/cortical defect on ?axillary view, age indeterminate. This may be secondary to age ?indeterminate trauma aor less likely an inflammatory arthropathy. ? ?Patient  recommended for close follow-up with orthopedic surgery for changes seen on x-ray and further management.  Recommended for Motrin use daily as needed for pain. ? ?Patient in no distress and overall condition improved here in the ED. Detailed discussions were had with the patient regarding current findings, and need for close f/u with PCP or on call doctor. The patient has been instructed to return immediately if the symptoms worsen in any way for re-evaluation. Patient verbalized understanding and is in agreement with current care plan. All questions answered prior to discharge. ? ? ? ? ? ? ? ? ?Final Clinical Impression(s) / ED Diagnoses ?Final diagnoses:  ?Abnormal x-ray of shoulder  ?Acute pain of left shoulder  ? ? ?Rx / DC Orders ?ED Discharge Orders   ? ?  Ordered  ?  ibuprofen (ADVIL) 600 MG tablet  Every 6 hours PRN       ? 02/06/22 1416  ? ?  ?  ? ?  ? ? ?  ?Lianne Cure, DO ?34/19/37 1417 ? ?

## 2022-02-10 NOTE — Progress Notes (Signed)
Surgical Instructions ? ? ? Your procedure is scheduled on 02/18/22. ? Report to [facility] Main Entrance "A" at 5:30 A.M., then check in with the Admitting office. ? Call this number if you have problems the morning of surgery: ? [phone] ? ? If you have any questions prior to your surgery date call [phone]: Open Monday-Friday 8am-4pm ? ? ? Remember: ? Do not eat or drink after midnight the night before your surgery ? ? ?  ? Take these medicines the morning of surgery with A SIP OF WATER:  ?benzonatate (TESSALON) ?hydrALAZINE (APRESOLINE) ?isosorbide mononitrate (IMDUR)  ?metoprolol succinate (TOPROL-XL) ?pantoprazole (PROTONIX) ? ? ?As of today, STOP taking any Aspirin (unless otherwise instructed by your surgeon) diclofenac Sodium (VOLTAREN) 1 % GEL, Aleve, Naproxen, Ibuprofen, Motrin, Advil, Goody's, BC's, all herbal medications, fish oil, and all vitamins. ? ?         ?Do not wear jewelry or makeup ?Do not wear lotions, powders, perfumes or deodorant. ?Do not shave 48 hours prior to surgery.   ?Do not bring valuables to the hospital. ?Do not wear nail polish, gel polish, artificial nails, or any other type of covering on natural nails (fingers and toes) ?If you have artificial nails or gel coating that need to be removed by a nail salon, please have this removed prior to surgery. Artificial nails or gel coating may interfere with anesthesia's ability to adequately monitor your vital signs. ? ?[facility] is not responsible for any belongings or valuables. .  ? ?Do NOT Smoke (Tobacco/Vaping)  24 hours prior to your procedure ? ?If you use a CPAP at night, you may bring your mask for your overnight stay. ?  ?Contacts, glasses, hearing aids, dentures or partials may not be worn into surgery, please bring cases for these belongings ?  ?For patients admitted to the hospital, discharge time will be determined by your treatment team. ?  ?Patients discharged the day of surgery will not be allowed to drive home,  and someone needs to stay with them for 24 hours. ? ? ?SURGICAL WAITING ROOM VISITATION ?Patients having surgery or a procedure in a hospital may have two support people. ?Children under the age of 54 must have an adult with them who is not the patient. ?They may stay in the waiting area during the procedure and may switch out with other visitors. If the patient needs to stay at the hospital during part of their recovery, the visitor guidelines for inpatient rooms apply. ? ?Please refer to the [facility] website for the visitor guidelines for Inpatients (after your surgery is over and you are in a regular room).  ? ? ? ?Special instructions:   ? ?Oral Hygiene is also important to reduce your risk of infection.  Remember - BRUSH YOUR TEETH THE MORNING OF SURGERY WITH YOUR REGULAR TOOTHPASTE ? ? ?- Preparing For Surgery ? ?Before surgery, you can play an important role. Because skin is not sterile, your skin needs to be as free of germs as possible. You can reduce the number of germs on your skin by washing with CHG (chlorahexidine gluconate) Soap before surgery.  CHG is an antiseptic cleaner which kills germs and bonds with the skin to continue killing germs even after washing.   ? ? ?Please do not use if you have an allergy to CHG or antibacterial soaps. If your skin becomes reddened/irritated stop using the CHG.  ?Do not shave (including legs and underarms) for at least 48 hours prior to  first CHG shower. It is OK to shave your face. ? ?Please follow these instructions carefully. ?  ? ? Shower the NIGHT BEFORE SURGERY and the MORNING OF SURGERY with CHG Soap.  ? If you chose to wash your hair, wash your hair first as usual with your normal shampoo. After you shampoo, rinse your hair and body thoroughly to remove the shampoo.  Then [employer] and genitals (private parts) with your normal soap and rinse thoroughly to remove soap. ? ?After that Use CHG Soap as you would any other liquid soap. You can apply  CHG directly to the skin and wash gently with a scrungie or a clean washcloth.  ? ?Apply the CHG Soap to your body ONLY FROM THE NECK DOWN.  Do not use on open wounds or open sores. Avoid contact with your eyes, ears, mouth and genitals (private parts). Wash Face and genitals (private parts)  with your normal soap.  ? ?Wash thoroughly, paying special attention to the area where your surgery will be performed. ? ?Thoroughly rinse your body with warm water from the neck down. ? ?DO NOT shower/wash with your normal soap after using and rinsing off the CHG Soap. ? ?Pat yourself dry with a CLEAN TOWEL. ? ?Wear CLEAN PAJAMAS to bed the night before surgery ? ?Place CLEAN SHEETS on your bed the night before your surgery ? ?DO NOT SLEEP WITH PETS. ? ? ?Day of Surgery: ? ?Take a shower with CHG soap. ?Wear Clean/Comfortable clothing the morning of surgery ?Do not apply any deodorants/lotions.   ?Remember to brush your teeth WITH YOUR REGULAR TOOTHPASTE. ? ? ? ?If you received a COVID test during your pre-op visit, it is requested that you wear a mask when out in public, stay away from anyone that may not be feeling well, and notify your surgeon if you develop symptoms. If you have been in contact with anyone that has tested positive in the last 10 days, please notify your surgeon. ? ?  ?Please read over the following fact sheets that you were given.   ?

## 2022-02-11 ENCOUNTER — Other Ambulatory Visit: Payer: Self-pay

## 2022-02-11 ENCOUNTER — Encounter (HOSPITAL_COMMUNITY): Payer: Self-pay

## 2022-02-11 ENCOUNTER — Encounter (HOSPITAL_COMMUNITY)
Admission: RE | Admit: 2022-02-11 | Discharge: 2022-02-11 | Disposition: A | Discharge status: Home / Self Care | Payer: Medicare Other | Source: Ambulatory Visit | Attending: Surgery | Admitting: Surgery

## 2022-02-11 VITALS — BP 123/65 | HR 86 | Temp 97.9°F | Resp 17 | Ht 68.5 in | Wt 115.0 lb

## 2022-02-11 DIAGNOSIS — I151 Hypertension secondary to other renal disorders: Secondary | ICD-10-CM | POA: Diagnosis not present

## 2022-02-11 DIAGNOSIS — N2889 Other specified disorders of kidney and ureter: Secondary | ICD-10-CM | POA: Insufficient documentation

## 2022-02-11 DIAGNOSIS — Z01812 Encounter for preprocedural laboratory examination: Secondary | ICD-10-CM | POA: Diagnosis not present

## 2022-02-11 DIAGNOSIS — Z01818 Encounter for other preprocedural examination: Secondary | ICD-10-CM

## 2022-02-11 HISTORY — DX: Thyrotoxicosis, unspecified without thyrotoxic crisis or storm: E05.90

## 2022-02-11 HISTORY — DX: Gastro-esophageal reflux disease without esophagitis: K21.9

## 2022-02-11 LAB — CBC
HCT: 37.5 % (ref 36.0–46.0)
Hemoglobin: 11.6 g/dL — ABNORMAL LOW (ref 12.0–15.0)
MCH: 29.3 pg (ref 26.0–34.0)
MCHC: 30.9 g/dL (ref 30.0–36.0)
MCV: 94.7 fL (ref 80.0–100.0)
Platelets: 172 10*3/uL (ref 150–400)
RBC: 3.96 MIL/uL (ref 3.87–5.11)
RDW: 15.9 % — ABNORMAL HIGH (ref 11.5–15.5)
WBC: 4 10*3/uL (ref 4.0–10.5)
nRBC: 0 % (ref 0.0–0.2)

## 2022-02-11 NOTE — Progress Notes (Signed)
PCP - no pcp ?Cardiologist - none ? ? ?PPM/ICD - denies ?Device Orders -  ?Rep Notified -  ? ?Chest x-ray - n/a ?EKG - 02/06/22 ?Stress Test - 12/19/20 ?ECHO - 05/08/21 ?Cardiac Cath - denies ? ?Sleep Study - yes but -OSA ? ?No diabetes ? ?Follow your surgeon's instructions on when to stop Aspirin.  If no instructions were given by your surgeon then you will need to call the office to get those instructions.   ? ? ?ERAS Protcol -yes clear liquids until 430am ? ? ?COVID TEST- not needed ? ? ?Anesthesia review: yes, CHF ? ?Patient denies shortness of breath, fever, cough and chest pain at PAT appointment ? ? ?All instructions explained to the patient, with a verbal understanding of the material. Patient agrees to go over the instructions while at home for a better understanding. Patient also instructed to self quarantine after being tested for COVID-19. The opportunity to ask questions was provided. ? ? ?

## 2022-02-11 NOTE — Progress Notes (Signed)
Surgical Instructions ? ? ? Your procedure is scheduled on Tueaday 02/18/22. ? Report to [facility] Main Entrance "A" at 5:30 A.M., then check in with the Admitting office. ? Call this number if you have problems the morning of surgery: ? [phone] ? ? If you have any questions prior to your surgery date call [phone]: Open Monday-Friday 8am-4pm ? ? ? Remember: ? Do not eat after midnight the night before your surgery ? ?You may drink clear liquids until 4:30am the morning of your surgery.   ?Clear liquids allowed are: Water, Non-Citrus Juices (without pulp), Carbonated Beverages, Clear Tea, Black Coffee ONLY (NO MILK, CREAM OR POWDERED CREAMER of any kind), and Gatorade ?  ?            Take these medicines the morning of surgery with A SIP OF WATER:  ?benzonatate (TESSALON) ?hydrALAZINE (APRESOLINE) ?isosorbide mononitrate (IMDUR)  ?metoprolol succinate (TOPROL-XL) ?pantoprazole (PROTONIX) ?  ?  ?As of today, STOP taking any Aspirin (unless otherwise instructed by your surgeon) diclofenac Sodium (VOLTAREN) 1 % GEL, Aleve, Naproxen, Ibuprofen, Motrin, Advil, Goody's, BC's, all herbal medications, fish oil, and all vitamins. ? ?         ?Do not wear jewelry or makeup ?Do not wear lotions, powders, perfumes/colognes, or deodorant. ?Do not shave 48 hours prior to surgery.  Men may shave face and neck. ?Do not bring valuables to the hospital. ?Do not wear nail polish, gel polish, artificial nails, or any other type of covering on natural nails (fingers and toes) ?If you have artificial nails or gel coating that need to be removed by a nail salon, please have this removed prior to surgery. Artificial nails or gel coating may interfere with anesthesia's ability to adequately monitor your vital signs. ? ?[facility] is not responsible for any belongings or valuables. .  ? ?Do NOT Smoke (Tobacco/Vaping)  24 hours prior to your procedure ? ?If you use a CPAP at night, you may bring your mask for your overnight stay. ?   ?Contacts, glasses, hearing aids, dentures or partials may not be worn into surgery, please bring cases for these belongings ?  ?For patients admitted to the hospital, discharge time will be determined by your treatment team. ?  ?Patients discharged the day of surgery will not be allowed to drive home, and someone needs to stay with them for 24 hours. ? ? ?SURGICAL WAITING ROOM VISITATION ?Patients having surgery or a procedure in a hospital may have two support people. ?Children under the age of 9 must have an adult with them who is not the patient. ?They may stay in the waiting area during the procedure and may switch out with other visitors. If the patient needs to stay at the hospital during part of their recovery, the visitor guidelines for inpatient rooms apply. ? ?Please refer to the [facility] website for the visitor guidelines for Inpatients (after your surgery is over and you are in a regular room).  ? ? ? ? ? ?Special instructions:   ? ?Oral Hygiene is also important to reduce your risk of infection.  Remember - BRUSH YOUR TEETH THE MORNING OF SURGERY WITH YOUR REGULAR TOOTHPASTE ? ? ?[facility]- Preparing For Surgery ? ?Before surgery, you can play an important role. Because skin is not sterile, your skin needs to be as free of germs as possible. You can reduce the number of germs on your skin by washing with CHG (chlorahexidine gluconate) Soap before surgery.  CHG is an  antiseptic cleaner which kills germs and bonds with the skin to continue killing germs even after washing.   ? ? ?Please do not use if you have an allergy to CHG or antibacterial soaps. If your skin becomes reddened/irritated stop using the CHG.  ?Do not shave (including legs and underarms) for at least 48 hours prior to first CHG shower. It is OK to shave your face. ? ?Please follow these instructions carefully. ?  ? ? Shower the NIGHT BEFORE SURGERY and the MORNING OF SURGERY with CHG Soap.  ? If you chose to wash your hair, wash  your hair first as usual with your normal shampoo. After you shampoo, rinse your hair and body thoroughly to remove the shampoo.  Then [employer] and genitals (private parts) with your normal soap and rinse thoroughly to remove soap. ? ?After that Use CHG Soap as you would any other liquid soap. You can apply CHG directly to the skin and wash gently with a scrungie or a clean washcloth.  ? ?Apply the CHG Soap to your body ONLY FROM THE NECK DOWN.  Do not use on open wounds or open sores. Avoid contact with your eyes, ears, mouth and genitals (private parts). Wash Face and genitals (private parts)  with your normal soap.  ? ?Wash thoroughly, paying special attention to the area where your surgery will be performed. ? ?Thoroughly rinse your body with warm water from the neck down. ? ?DO NOT shower/wash with your normal soap after using and rinsing off the CHG Soap. ? ?Pat yourself dry with a CLEAN TOWEL. ? ?Wear CLEAN PAJAMAS to bed the night before surgery ? ?Place CLEAN SHEETS on your bed the night before your surgery ? ?DO NOT SLEEP WITH PETS. ? ? ?Day of Surgery: ?Take a shower with CHG soap. ?Wear Clean/Comfortable clothing the morning of surgery ?Do not apply any deodorants/lotions.   ?Remember to brush your teeth WITH YOUR REGULAR TOOTHPASTE. ? ? ? ?If you received a COVID test during your pre-op visit, it is requested that you wear a mask when out in public, stay away from anyone that may not be feeling well, and notify your surgeon if you develop symptoms. If you have been in contact with anyone that has tested positive in the last 10 days, please notify your surgeon. ? ?  ?Please read over the following fact sheets that you were given.  ? ?

## 2022-02-12 ENCOUNTER — Encounter (HOSPITAL_COMMUNITY): Payer: Self-pay | Admitting: Physician Assistant

## 2022-02-13 NOTE — Progress Notes (Signed)
?Cardiology Office Note:   ? ?Date:  02/20/2022  ? ?ID:  Cynthia Bray, DOB February 08, 1961, MRN [id number] ? ?PCP:  Pcp, No ?  ?[facility] Providers ?Cardiologist:  Dr. Sallyanne Kuster, awaiting approval from Dr. Oval Linsey that she will accept her ?- they prefer to follow with Dr. Oval Linsey at [facility] ? ? ?Referring MD: No ref. provider found  ? ?Chief Complaint  ?Patient presents with  ? Pre-op Exam  ?  NICM  ? ? ?History of Present Illness:   ? ?Cynthia Bray is a 61 y.o. female with a hx of ESRD on HD MWF, chronic systolic heart failure with an EF of 25%, hyperlipidemia, hypertension, GERD, and no significant coronary artery disease by heart catheterization.  She was admitted March 2022 with chest pain and ultimately underwent heart cath following abnormal nuclear stress test.  Heart cath showed no significant coronary artery disease with elevated LVEDP.  Symptoms improved after undergoing HD.  She was again admitted 04/11/2021 with chest pain found to have staph epidermis bacteremia, hypercalcemia and newly depressed LVEF of 20 to 25% down from prior echo in January 2022 that showed an LVEF of 40 to 45%.  Nonischemic cardiomyopathy was suspected given recent heart catheterization.  GDMT was initiated with metoprolol and losartan. TEE 04/19/21 negative for valuvlar vegetations with LVEF 25-30%, mild TR, mild MR. She primarily receives care from [facility] / [facility]. We first met her during a hospitalization 05/08/21 for chest pain that developed during HD. 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  ? ?It does not appear she followed up with either cardiology service.  ? ?She has been diagnosed with secondary hyperparathyroidism of renal origin and is pending parathyroidectomy. Pt is pending parathyroidectomy with autotransplantation to forearm and we are asked for preoperative risk evaluation for MACE.  ? ?Appears she is maintained on 100 mg toprol, 25 mg hydralazine TID, imdur 30 mg daily, ASA, 40 mg lipitor. ? ?She  presents with her son. During my interview, she denies chest pain, dyspnea, DOE, lower extremity swelling, orthopnea, and syncope. She has been compliant with HD (since 2002). She is euvolemic today. She walks 10-15 mins daily and does moderate house work and grocery shops.  ? ? ?Past Medical History:  ?Diagnosis Date  ? CHF (congestive heart failure) ([place])   ? EF 25%  ? ESRD (end stage renal disease) ([place])   ? GERD (gastroesophageal reflux disease)   ? Hypertension   ? Hyperthyroidism   ? Renal disorder   ? ? ?Past Surgical History:  ?Procedure Laterality Date  ? AV FISTULA PLACEMENT    ? ? ?Current Medications: ?Current Meds  ?Medication Sig  ? aspirin EC 81 MG tablet Take 81 mg by mouth daily. Swallow whole.  ? atorvastatin (LIPITOR) 40 MG tablet Take 40 mg by mouth every evening.  ? benzonatate (TESSALON) 100 MG capsule Take 1 capsule (100 mg total) by mouth every 8 (eight) hours.  ? diclofenac Sodium (VOLTAREN) 1 % GEL Apply 4 g topically 4 (four) times daily.  ? hydrALAZINE (APRESOLINE) 25 MG tablet Take 1 tablet (25 mg total) by mouth every 8 (eight) hours.  ? ibuprofen (ADVIL) 600 MG tablet Take 1 tablet (600 mg total) by mouth every 6 (six) hours as needed.  ? isosorbide mononitrate (IMDUR) 30 MG 24 hr tablet Take 1 tablet (30 mg total) by mouth daily.  ? lidocaine-prilocaine (EMLA) cream Apply 1 application topically See admin instructions. Apply small amount to access site 1-2 hours before dialysis.  ?  metoprolol succinate (TOPROL-XL) 100 MG 24 hr tablet Take 1 tablet (100 mg total) by mouth daily. Take with or immediately following a meal.  ? multivitamin (RENA-VIT) TABS tablet Take 1 tablet by mouth at bedtime.  ? VELPHORO 500 MG chewable tablet Chew 500 mg by mouth 3 (three) times daily.  ?  ? ?Allergies:   Cinacalcet; Aplisol [tuberculin, ppd]; Lisinopril; Penicillin g; and Tuberculin ppd  ? ?Social History  ? ?Socioeconomic History  ? Marital status: Widowed  ?  Spouse name: Not on file  ? Number of  children: Not on file  ? Years of education: Not on file  ? Highest education level: Not on file  ?Occupational History  ? Not on file  ?Tobacco Use  ? Smoking status: Never  ? Smokeless tobacco: Never  ?Vaping Use  ? Vaping Use: Never used  ?Substance and Sexual Activity  ? Alcohol use: Never  ? Drug use: Never  ? Sexual activity: Not on file  ?Other Topics Concern  ? Not on file  ?Social History Narrative  ? Not on file  ? ?Social Determinants of Health  ? ?Financial Resource Strain: Not on file  ?Food Insecurity: Not on file  ?Transportation Needs: Not on file  ?Physical Activity: Not on file  ?Stress: Not on file  ?Social Connections: Not on file  ?  ? ?Family History: ?The patient's family history includes Kidney disease in her mother. ? ?ROS:   ?Please see the history of present illness.    ? All other systems reviewed and are negative. ? ?EKGs/Labs/Other Studies Reviewed:   ? ?The following studies were reviewed today: ? ?Echo 04/2021: ?1. Left ventricular ejection fraction, by estimation, is 25 to 30%. The  ?left ventricle has severely decreased function. The left ventricle  ?demonstrates global hypokinesis. There is mild left ventricular  ?hypertrophy. Left ventricular diastolic parameters  ? are consistent with Grade III diastolic dysfunction (restrictive).  ?Elevated left atrial pressure.  ? 2. Right ventricular systolic function is normal. The right ventricular  ?size is mildly enlarged. There is moderately elevated pulmonary artery  ?systolic pressure. The estimated right ventricular systolic pressure is  ?01.0 mmHg.  ? 3. Left atrial size was moderately dilated.  ? 4. Right atrial size was mildly dilated.  ? 5. A small pericardial effusion is present.  ? 6. The mitral valve is normal in structure. Mild to moderate mitral valve  ?regurgitation.  ? 7. Tricuspid valve regurgitation is mild to moderate.  ? 8. The aortic valve is tricuspid. Aortic valve regurgitation is not  ?visualized. Mild aortic valve  sclerosis is present, with no evidence of  ?aortic valve stenosis.  ? 9. The inferior vena cava is dilated in size with >50% respiratory  ?variability, suggesting right atrial pressure of 8 mmHg.  ? ?EKG:  EKG is  ordered today.  The ekg ordered today demonstrates sinus rhythm HR 85 ? ?Recent Labs: ?05/07/2021: B Natriuretic Peptide >4,500.0 ?05/09/2021: Magnesium 1.8 ?06/17/2021: ALT 7; BUN 27; Creatinine, Ser 9.58; Potassium 3.7; Sodium 130 ?02/11/2022: Hemoglobin 11.6; Platelets 172  ?Recent Lipid Panel ?No results found for: CHOL, TRIG, HDL, CHOLHDL, VLDL, LDLCALC, LDLDIRECT ? ? ?Risk Assessment/Calculations:   ?  ? ?    ? ?Physical Exam:   ? ?VS:  BP 122/66   Pulse 85   Ht 5' 8"  (1.727 m)   Wt 116 lb 12.8 oz (53 kg)   SpO2 99%   BMI 17.76 kg/m?    ? ?Wt Readings from Last  3 Encounters:  ?02/20/22 116 lb 12.8 oz (53 kg)  ?02/11/22 115 lb (52.2 kg)  ?02/06/22 131 lb (59.4 kg)  ?  ? ?GEN:  Well nourished, well developed in no acute distress ?HEENT: Normal ?NECK: No JVD; No carotid bruits ?LYMPHATICS: No lymphadenopathy ?CARDIAC: RRR, no murmurs, rubs, gallops ?RESPIRATORY:  Clear to auscultation without rales, wheezing or rhonchi  ?ABDOMEN: Soft, non-tender, non-distended ?MUSCULOSKELETAL:  No edema; No deformity RUE fistual, scar on LEU from prior fistula ?SKIN: Warm and dry ?NEUROLOGIC:  Alert and oriented x 3 ?PSYCHIATRIC:  Normal affect  ? ?ASSESSMENT:   ? ?1. Preoperative cardiovascular examination   ?2. NICM (nonischemic cardiomyopathy) ([place])   ?3. Chronic systolic heart failure ([place])   ?4. Hypertension secondary to other renal disorders   ?5. Angioedema due to angiotensin converting enzyme inhibitor (ACE-I)   ?6. Hyperlipidemia with target LDL less than 70   ?7. ESRD (end stage renal disease) ([place])   ?8. Secondary hyperparathyroidism ([place])   ? ?PLAN:   ? ?In order of problems listed above: ? ? ?Nonischemic cardiomyopathy ?Chronic systolic heart failure ?Hypertension  ?- LVEF 25% ?- TEE with no vegetation  following bacteremia with staph epidermis ?- GDMT includes BB, imdur, hydralazine ?- angiodema to ACEI ?- volume status managed with HD ? ? ?Hyperlipidemia with LDL goal < 70 ?Continue 40 mg lipitor ? ? ?ESRD on HD

## 2022-02-14 ENCOUNTER — Telehealth: Payer: Self-pay | Admitting: Cardiovascular Disease

## 2022-02-14 NOTE — Telephone Encounter (Signed)
No objections

## 2022-02-14 NOTE — Telephone Encounter (Signed)
[facility] would like to do a provider switch from DR. Croitoru to  Dr. Oval Linsey. Please advise ?

## 2022-02-17 ENCOUNTER — Telehealth: Payer: Self-pay | Admitting: *Deleted

## 2022-02-17 NOTE — Telephone Encounter (Signed)
? ?  Pre-operative Risk Assessment  ?  ?Patient Name: Cynthia Bray  ?DOB: 31-Aug-1961 ?MRN: [id number]  ? ?  ? ?Request for Surgical Clearance   ? ?Procedure:   Total parathyroidectomy with autotransplant to left forearm ? ?Date of Surgery:  Clearance TBD                              ?   ?Surgeon:  Dr. Armandina Gemma  ?Surgeon's Group or Practice Name:  [facility] ?Phone number:  [phone] ?Fax number:  [phone] ?  ?Type of Clearance Requested:   ?- Medical  ?  ?Type of Anesthesia:  General  ?  ?Additional requests/questions:   ? ? ?Signed, ?[name] Larmer A Haniel Fix   ?02/17/2022, 11:26 AM  ? ?

## 2022-02-18 ENCOUNTER — Inpatient Hospital Stay (HOSPITAL_COMMUNITY): Admission: RE | Admit: 2022-02-18 | Payer: Medicare Other | Source: Home / Self Care | Admitting: Surgery

## 2022-02-18 ENCOUNTER — Encounter (HOSPITAL_COMMUNITY): Admission: RE | Payer: Self-pay | Source: Home / Self Care

## 2022-02-18 SURGERY — PARATHYROIDECTOMY, WITH AUTOGRAFT TRANSPLANT
Anesthesia: General

## 2022-02-18 NOTE — Telephone Encounter (Signed)
Pt's son called back to check on status of provider switch. He was hoping to have this switch and schedule before pt's appt 05/11 ?

## 2022-02-20 ENCOUNTER — Ambulatory Visit (INDEPENDENT_AMBULATORY_CARE_PROVIDER_SITE_OTHER): Payer: Medicare Other | Admitting: Physician Assistant

## 2022-02-20 ENCOUNTER — Telehealth: Payer: Self-pay

## 2022-02-20 ENCOUNTER — Encounter: Payer: Self-pay | Admitting: Physician Assistant

## 2022-02-20 VITALS — BP 122/66 | HR 85 | Ht 68.0 in | Wt 116.8 lb

## 2022-02-20 DIAGNOSIS — E785 Hyperlipidemia, unspecified: Secondary | ICD-10-CM

## 2022-02-20 DIAGNOSIS — T464X5A Adverse effect of angiotensin-converting-enzyme inhibitors, initial encounter: Secondary | ICD-10-CM | POA: Insufficient documentation

## 2022-02-20 DIAGNOSIS — Z01810 Encounter for preprocedural cardiovascular examination: Secondary | ICD-10-CM | POA: Diagnosis not present

## 2022-02-20 DIAGNOSIS — I151 Hypertension secondary to other renal disorders: Secondary | ICD-10-CM

## 2022-02-20 DIAGNOSIS — I5022 Chronic systolic (congestive) heart failure: Secondary | ICD-10-CM | POA: Diagnosis not present

## 2022-02-20 DIAGNOSIS — N2581 Secondary hyperparathyroidism of renal origin: Secondary | ICD-10-CM

## 2022-02-20 DIAGNOSIS — I428 Other cardiomyopathies: Secondary | ICD-10-CM

## 2022-02-20 DIAGNOSIS — N186 End stage renal disease: Secondary | ICD-10-CM

## 2022-02-20 DIAGNOSIS — T783XXA Angioneurotic edema, initial encounter: Secondary | ICD-10-CM

## 2022-02-20 DIAGNOSIS — N2889 Other specified disorders of kidney and ureter: Secondary | ICD-10-CM

## 2022-02-20 NOTE — Telephone Encounter (Signed)
Patient would like to switch from the care of Dr. Sallyanne Kuster to Dr. Oval Linsey. ?

## 2022-02-20 NOTE — Patient Instructions (Signed)
Medication Instructions:  ?Your physician recommends that you continue on your current medications as directed. Please refer to the Current Medication list given to you today. ? ?*If you need a refill on your cardiac medications before your next appointment, please call your pharmacy* ? ?Lab Work: ?NONE ordered at this time of appointment  ? ?If you have labs (blood work) drawn today and your tests are completely normal, you will receive your results only by: ?MyChart Message (if you have MyChart) OR ?A paper copy in the mail ?If you have any lab test that is abnormal or we need to change your treatment, we will call you to review the results. ? ?Testing/Procedures: ?NONE ordered at this time of appointment  ? ?Follow-Up: ?At [facility], you and your health needs are our priority.  As part of our continuing mission to provide you with exceptional heart care, we have created designated Provider Care Teams.  These Care Teams include your primary Cardiologist (physician) and Advanced Practice Providers (APPs -  Physician Assistants and Nurse Practitioners) who all work together to provide you with the care you need, when you need it. ? ?We recommend signing up for the patient portal called "MyChart".  Sign up information is provided on this After Visit Summary.  MyChart is used to connect with patients for Virtual Visits (Telemedicine).  Patients are able to view lab/test results, encounter notes, upcoming appointments, etc.  Non-urgent messages can be sent to your provider as well.   ?To learn more about what you can do with MyChart, go to [email or web address].   ? ?Your next appointment:   ?3 month(s) ? ?The format for your next appointment:   ?In Person ? ?Provider:   ?Skeet Latch, MD  ? ?Other Instructions ? ? ?Important Information About [facility] ? ? ? ? ? ? ?

## 2022-02-20 NOTE — Telephone Encounter (Signed)
No objection 

## 2022-03-26 NOTE — Pre-Procedure Instructions (Signed)
Surgical Instructions    Your procedure is scheduled on Thursday, April 03, 2022.  Report to [facility] Main Entrance "A" at 5:30 A.M., then check in with the Admitting office.  Call this number if you have problems the morning of surgery:  [phone]   If you have any questions prior to your surgery date call [phone]: Open Monday-Friday 8am-4pm    Remember:  Do not eat after midnight the night before your surgery  You may drink clear liquids until 4:30 AM the morning of your surgery.   Clear liquids allowed are: Water, Non-Citrus Juices (without pulp), Carbonated Beverages, Clear Tea, Black Coffee Only (NO MILK, CREAM OR POWDERED CREAMER of any kind), and Gatorade.    Take these medicines the morning of surgery with A SIP OF WATER   atorvastatin (LIPITOR)  hydrALAZINE (APRESOLINE  isosorbide mononitrate (IMDUR)   metoprolol succinate (TOPROL-XL)   Take these medicines the morning of surgery AS NEEDED:  benzonatate (TESSALON)   Follow your surgeon's instructions on when to stop Aspirin.  If no instructions were given by your surgeon then you will need to call the office to get those instructions.     As of today, STOP taking any Aleve, Naproxen, Ibuprofen, Motrin, Advil, Goody's, BC's, all herbal medications, fish oil, and all vitamins.                     Do NOT Smoke (Tobacco/Vaping) for 24 hours prior to your procedure.  If you use a CPAP at night, you may bring your mask/headgear for your overnight stay.   Contacts, glasses, piercing's, hearing aid's, dentures or partials may not be worn into surgery, please bring cases for these belongings.    For patients admitted to the hospital, discharge time will be determined by your treatment team.   Patients discharged the day of surgery will not be allowed to drive home, and someone needs to stay with them for 24 hours.  SURGICAL WAITING ROOM VISITATION Patients having surgery or a procedure may have two support people  in the waiting room. These visitors may be switched out with other visitors if needed. Children under the age of 33 must have an adult accompany them who is not the patient. If the patient needs to stay at the hospital during part of their recovery, the visitor guidelines for inpatient rooms apply.  Please refer to the [facility] website for the visitor guidelines for Inpatients (after your surgery is over and you are in a regular room).    Special instructions:   [place]- Preparing For Surgery  Before surgery, you can play an important role. Because skin is not sterile, your skin needs to be as free of germs as possible. You can reduce the number of germs on your skin by washing with CHG (chlorahexidine gluconate) Soap before surgery.  CHG is an antiseptic cleaner which kills germs and bonds with the skin to continue killing germs even after washing.    Oral Hygiene is also important to reduce your risk of infection.  Remember - BRUSH YOUR TEETH THE MORNING OF SURGERY WITH YOUR REGULAR TOOTHPASTE  Please do not use if you have an allergy to CHG or antibacterial soaps. If your skin becomes reddened/irritated stop using the CHG.  Do not shave (including legs and underarms) for at least 48 hours prior to first CHG shower. It is OK to shave your face.  Please follow these instructions carefully.   Shower the [employer] SURGERY and the  MORNING OF SURGERY  If you chose to wash your hair, wash your hair first as usual with your normal shampoo.  After you shampoo, rinse your hair and body thoroughly to remove the shampoo.  Use CHG Soap as you would any other liquid soap. You can apply CHG directly to the skin and wash gently with a scrungie or a clean washcloth.   Apply the CHG Soap to your body ONLY FROM THE NECK DOWN.  Do not use on open wounds or open sores. Avoid contact with your eyes, ears, mouth and genitals (private parts). Wash Face and genitals (private parts)  with your normal  soap.   Wash thoroughly, paying special attention to the area where your surgery will be performed.  Thoroughly rinse your body with warm water from the neck down.  DO NOT shower/wash with your normal soap after using and rinsing off the CHG Soap.  Pat yourself dry with a CLEAN TOWEL.  Wear CLEAN PAJAMAS to bed the night before surgery  Place CLEAN SHEETS on your bed the night before your surgery  DO NOT SLEEP WITH PETS.   Day of Surgery: Take a shower with CHG soap. Do not wear jewelry or makeup Do not wear lotions, powders, perfumes/colognes, or deodorant. Do not shave 48 hours prior to surgery.  Men may shave face and neck. Do not bring valuables to the hospital.  [facility] is not responsible for any belongings or valuables. Do not wear nail polish, gel polish, artificial nails, or any other type of covering on natural nails (fingers and toes) If you have artificial nails or gel coating that need to be removed by a nail salon, please have this removed prior to surgery. Artificial nails or gel coating may interfere with anesthesia's ability to adequately monitor your vital signs. Wear Clean/Comfortable clothing the morning of surgery Remember to brush your teeth WITH YOUR REGULAR TOOTHPASTE.   Please read over the following fact sheets that you were given.    If you received a COVID test during your pre-op visit  it is requested that you wear a mask when out in public, stay away from anyone that may not be feeling well and notify your surgeon if you develop symptoms. If you have been in contact with anyone that has tested positive in the last 10 days please notify you surgeon.

## 2022-03-27 ENCOUNTER — Other Ambulatory Visit: Payer: Self-pay

## 2022-03-27 ENCOUNTER — Encounter (HOSPITAL_COMMUNITY): Payer: Self-pay

## 2022-03-27 ENCOUNTER — Encounter (HOSPITAL_COMMUNITY)
Admission: RE | Admit: 2022-03-27 | Discharge: 2022-03-27 | Disposition: A | Discharge status: Home / Self Care | Payer: Medicare Other | Source: Ambulatory Visit | Attending: Surgery | Admitting: Surgery

## 2022-03-27 VITALS — BP 131/70 | HR 86 | Temp 98.1°F | Resp 16 | Ht 68.0 in | Wt 115.1 lb

## 2022-03-27 DIAGNOSIS — I081 Rheumatic disorders of both mitral and tricuspid valves: Secondary | ICD-10-CM | POA: Insufficient documentation

## 2022-03-27 DIAGNOSIS — N186 End stage renal disease: Secondary | ICD-10-CM | POA: Insufficient documentation

## 2022-03-27 DIAGNOSIS — Z01812 Encounter for preprocedural laboratory examination: Secondary | ICD-10-CM | POA: Insufficient documentation

## 2022-03-27 DIAGNOSIS — Z992 Dependence on renal dialysis: Secondary | ICD-10-CM | POA: Diagnosis not present

## 2022-03-27 DIAGNOSIS — I132 Hypertensive heart and chronic kidney disease with heart failure and with stage 5 chronic kidney disease, or end stage renal disease: Secondary | ICD-10-CM | POA: Insufficient documentation

## 2022-03-27 DIAGNOSIS — I5022 Chronic systolic (congestive) heart failure: Secondary | ICD-10-CM | POA: Diagnosis not present

## 2022-03-27 DIAGNOSIS — E785 Hyperlipidemia, unspecified: Secondary | ICD-10-CM | POA: Insufficient documentation

## 2022-03-27 DIAGNOSIS — I428 Other cardiomyopathies: Secondary | ICD-10-CM | POA: Insufficient documentation

## 2022-03-27 DIAGNOSIS — Z01818 Encounter for other preprocedural examination: Secondary | ICD-10-CM

## 2022-03-27 LAB — CBC
HCT: 38.9 % (ref 36.0–46.0)
Hemoglobin: 11.9 g/dL — ABNORMAL LOW (ref 12.0–15.0)
MCH: 28.8 pg (ref 26.0–34.0)
MCHC: 30.6 g/dL (ref 30.0–36.0)
MCV: 94.2 fL (ref 80.0–100.0)
Platelets: 190 10*3/uL (ref 150–400)
RBC: 4.13 MIL/uL (ref 3.87–5.11)
RDW: 17 % — ABNORMAL HIGH (ref 11.5–15.5)
WBC: 5.5 10*3/uL (ref 4.0–10.5)
nRBC: 0 % (ref 0.0–0.2)

## 2022-03-27 NOTE — Progress Notes (Signed)
PCP - None. Per son he is trying to set her up with one. Cardiologist - Dr. Sallyanne Kuster (they are trying to get in with Dr. Oval Linsey.  PPM/ICD - denies Device Orders - n/a Rep Notified - n/a  Chest x-ray - 06/17/2021 EKG - 02/20/2022 Stress Test - Per patient 1999 or 2000 ECHO - 05/08/2021 Cardiac Cath - March 2022  Sleep Study - Per patient, she had sleep studying 2002 and it was negative for OSA CPAP - n/a  No DM  Blood Thinner Instructions: n/a Aspirin Instructions: Follow-up with Dr. Harlow Asa to see when to stop aspirin. MD office number given to patient and son at PAT visit  ERAS Protcol - Yes. Clear liquids until 0430 morning of surgery PRE-SURGERY Ensure or G2- n/a. None ordered  COVID TEST- n/a   Anesthesia review: Yes. Cardiac clearance note available in Epic dated 02/20/2022. Also ESRD on HD.  Patient denies shortness of breath, fever, cough and chest pain at PAT appointment   All instructions explained to the patient, with a verbal understanding of the material. Patient agrees to go over the instructions while at home for a better understanding. Patient also instructed to self quarantine after being tested for COVID-19. The opportunity to ask questions was provided.

## 2022-03-28 NOTE — Progress Notes (Addendum)
Anesthesia Chart Review:  [place] cardiology for history of NICM, HFrEF, HLD, HTN, no significant coronary artery disease by catheterization 2022. She was admitted March 2022 with chest pain and ultimately underwent heart cath following abnormal nuclear stress test.  Per discharge summary from [facility] 01/03/21, "3/24: Had Angiography yesterday: No significant coronary artery disease. Elevated left ventricular end-diastolic pressure. Feels much better. Tolerating diet well. Ambulating well w/o any distress on RA. Has been afebrile and stayed hemodynamically stable so she was d/c home. Had HD yesterday.".    She was again admitted 04/11/2021 with chest pain found to have staph epidermis bacteremia, hypercalcemia and newly depressed LVEF of 20 to 25% down from prior echo in January 2022 that showed an LVEF of 40 to 45%.  Nonischemic cardiomyopathy was suspected given recent heart catheterization.  GDMT was initiated with metoprolol and losartan. TEE 04/19/21 negative for valuvlar vegetations with LVEF 25-30%, mild TR, mild MR. She primarily receives care from [facility] / [place]. 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  She was seen by Fabian Sharp, PA-C on 02/20/2022 for preop evaluation.  Per note, "Preoperative risk evaluation for MACE: Reassuring heart catheterization 2022 with nonobstructive disease. Stable nonischemic cardiomyopathy, volume managed by HD. She has a 6.6% risk of MACE in the perioperative period. Given her recent TTE, TEE, and heart cath, I do not think further cardiovascular testing is needed at this time. She understands her risk and wishes to proceed. Therefore, based on ACC/AHA guidelines, the patient would be at acceptable risk for the planned procedure without further cardiovascular testing."   ESRD on HD Monday Wednesday Friday since 2002.  Preop CBC reviewed, unremarkable. Will need DOS BMP.  EKG 02/20/22: NSR. Rate 85. LVH.  TTE 05/08/2021:  1. Left ventricular  ejection fraction, by estimation, is 25 to 30%. The  left ventricle has severely decreased function. The left ventricle  demonstrates global hypokinesis. There is mild left ventricular  hypertrophy. Left ventricular diastolic parameters   are consistent with Grade III diastolic dysfunction (restrictive).  Elevated left atrial pressure.   2. Right ventricular systolic function is normal. The right ventricular  size is mildly enlarged. There is moderately elevated pulmonary artery  systolic pressure. The estimated right ventricular systolic pressure is  76.7 mmHg.   3. Left atrial size was moderately dilated.   4. Right atrial size was mildly dilated.   5. A small pericardial effusion is present.   6. The mitral valve is normal in structure. Mild to moderate mitral valve  regurgitation.   7. Tricuspid valve regurgitation is mild to moderate.   8. The aortic valve is tricuspid. Aortic valve regurgitation is not  visualized. Mild aortic valve sclerosis is present, with no evidence of  aortic valve stenosis.   9. The inferior vena cava is dilated in size with >50% respiratory  variability, suggesting right atrial pressure of 8 mmHg.   TEE 04/19/21 (care everywhere): SUMMARY Left ventricular systolic function is severely reduced. LV ejection fraction = 25-30%. There is severe global hypokinesis of the left ventricle. The left ventricle is moderately dilated. Mild left ventricular hypertrophy Non-specific thickening of the tricuspid valve. No definite vegetation (Duke's minor) There is mild tricuspid regurgitation. There is mild mitral regurgitation. No definite valvular vegetations were seen.     Wynonia Musty [facility] Short Stay Center/Anesthesiology Phone [phone] 03/28/2022 10:54 AM

## 2022-03-28 NOTE — Anesthesia Preprocedure Evaluation (Addendum)
Anesthesia Evaluation  Patient identified by MRN, date of birth, ID band Patient awake    Reviewed: Allergy & Precautions, NPO status , Patient's Chart, lab work & pertinent test results, reviewed documented beta blocker date and time   History of Anesthesia Complications Negative for: history of anesthetic complications  Airway Mallampati: II  TM Distance: >3 FB Neck ROM: Full    Dental  (+) Missing,    Pulmonary neg pulmonary ROS,    Pulmonary exam normal        Cardiovascular hypertension, Pt. on medications and Pt. on home beta blockers +CHF  Normal cardiovascular exam     Neuro/Psych negative neurological ROS  negative psych ROS   GI/Hepatic Neg liver ROS, GERD  Controlled,  Endo/Other  Hyperthyroidism   Renal/GU ESRF and DialysisRenal disease (M/W/F)  negative genitourinary   Musculoskeletal negative musculoskeletal ROS (+)   Abdominal   Peds  Hematology  (+) Blood dyscrasia, anemia ,   Anesthesia Other Findings Day of surgery medications reviewed with patient.  Reproductive/Obstetrics negative OB ROS                           Anesthesia Physical Anesthesia Plan  ASA: 4  Anesthesia Plan: General   Post-op Pain Management: Tylenol PO (pre-op)*   Induction: Intravenous  PONV Risk Score and Plan: 3 and Treatment may vary due to age or medical condition, Midazolam, Dexamethasone and Ondansetron  Airway Management Planned: Oral ETT  Additional Equipment: Arterial line  Intra-op Plan:   Post-operative Plan: Extubation in OR  Informed Consent: I have reviewed the patients History and Physical, chart, labs and discussed the procedure including the risks, benefits and alternatives for the proposed anesthesia with the patient or authorized representative who has indicated his/her understanding and acceptance.     Dental advisory given  Plan Discussed with: CRNA  Anesthesia  Plan Comments: (PAT note by Karoline Caldwell, PA-C:  [place] cardiology for history of NICM, HFrEF, HLD, HTN, no significant coronary artery disease by catheterization 2022. She was admitted March 2022 with chest pain and ultimately underwent heart cath following abnormal nuclear stress test. Per discharge summary from [facility] 01/03/21, "3/24: Had Angiography yesterday: No significant coronary artery disease. Elevated left ventricular end-diastolic pressure. Feels much better. Tolerating diet well. Ambulating well w/o any distress on RA. Has been afebrile and stayed hemodynamically stable so she was d/c home. Had HD yesterday.".   She was again admitted 04/11/2021 with chest pain found to have staph epidermisbacteremia, hypercalcemia and newly depressed LVEF of 20 to 25% down from prior echo in January 2022 that showed an LVEF of 40 to 45%. Nonischemic cardiomyopathy was suspected given recent heart catheterization. GDMT was initiated with metoprololand losartan. TEE7/8/22negative for valuvlar vegetationswith LVEF 25-30%, mild TR, mild MR. She primarily receives care from [facility] / [place]. 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  She was seen by Fabian Sharp, PA-C on 02/20/2022 for preop evaluation.  Per note, "Preoperative risk evaluation for MACE: Reassuring heart catheterization 2022 with nonobstructive disease. Stable nonischemic cardiomyopathy, volume managed by HD. She has a 6.6% risk of MACE in the perioperative period. Given her recent TTE, TEE, and heart cath, I do not think further cardiovascular testing is needed at this time.She understands her risk and wishes to proceed. Therefore, based on ACC/AHA guidelines, the patient would be at acceptable risk for the planned procedure without further cardiovascular testing."  ESRD on HD Monday Wednesday  Friday since 2002.  Preop CBC reviewed, unremarkable. Will need DOS BMP.  EKG 02/20/22: NSR. Rate 85. LVH.  TTE  05/08/2021: 1. Left ventricular ejection fraction, by estimation, is 25 to 30%. The  left ventricle has severely decreased function. The left ventricle  demonstrates global hypokinesis. There is mild left ventricular  hypertrophy. Left ventricular diastolic parameters  are consistent with Grade III diastolic dysfunction (restrictive).  Elevated left atrial pressure.  2. Right ventricular systolic function is normal. The right ventricular  size is mildly enlarged. There is moderately elevated pulmonary artery  systolic pressure. The estimated right ventricular systolic pressure is  79.4 mmHg.  3. Left atrial size was moderately dilated.  4. Right atrial size was mildly dilated.  5. A small pericardial effusion is present.  6. The mitral valve is normal in structure. Mild to moderate mitral valve  regurgitation.  7. Tricuspid valve regurgitation is mild to moderate.  8. The aortic valve is tricuspid. Aortic valve regurgitation is not  visualized. Mild aortic valve sclerosis is present, with no evidence of  aortic valve stenosis.  9. The inferior vena cava is dilated in size with >50% respiratory  variability, suggesting right atrial pressure of 8 mmHg.   TEE 04/19/21 (care everywhere): SUMMARY Left ventricular systolic function is severely reduced. LV ejection fraction = 25-30%. There is severe global hypokinesis of the left ventricle. The left ventricle is moderately dilated. Mild left ventricular hypertrophy Non-specific thickening of the tricuspid valve. No definite vegetation (Duke's minor) There is mild tricuspid regurgitation. There is mild mitral regurgitation. No definite valvular vegetations were seen.  )     Anesthesia Quick Evaluation

## 2022-03-30 ENCOUNTER — Encounter (HOSPITAL_COMMUNITY): Payer: Self-pay | Admitting: Surgery

## 2022-03-30 NOTE — H&P (Signed)
REFERRING PHYSICIAN: Wendie Agreste, MD  PROVIDER: Jeray Shugart Charlotta Newton, MD    Chief Complaint: New Consultation (Secondary hyperparathyroidism of renal origin)   History of Present Illness:  Patient is referred by Dr. Otelia Santee for surgical evaluation and management of secondary hyperparathyroidism of renal origin. Patient has been on hemodialysis for approximately 6 years. She dialyzes at the kidney center in [facility] on Mondays Wednesdays and Fridays. She currently has an access in the right upper extremity. Recent laboratories show a calcium level of 9.9, phosphorus level of 5.7, and a parathyroid hormone level of 2327. Patient denies any complications. She has had no heterotopic ossification. She has no skin ulceration. She denies fatigue. She is accompanied today by her son. Patient has had no prior head or neck surgery. She has been on Sensipar.  Review of Systems: A complete review of systems was obtained from the patient. I have reviewed this information and discussed as appropriate with the patient. See HPI as well for other ROS.  Review of Systems  Constitutional: Negative.  HENT: Negative.  Eyes: Negative.  Respiratory: Negative.  Cardiovascular: Negative.  Gastrointestinal: Negative.  Genitourinary: Negative.  Musculoskeletal: Negative.  Skin: Negative.  Neurological: Negative.  Endo/Heme/Allergies: Negative.  Psychiatric/Behavioral: Negative.    Medical History: History reviewed. No pertinent past medical history.  Patient Active Problem List  Diagnosis   Secondary hyperparathyroidism of renal origin (CMS-HCC)   History reviewed. No pertinent surgical history.   Allergies  Allergen Reactions   Ace Inhibitors Swelling and Other (See Comments)  Angioedema--Lisinopril Patient reports facial, lip, bilateral arm edema with lisinopril.  Angioedema   Current Outpatient Medications on File Prior to Visit  Medication Sig Dispense Refill   atenolol  (TENORMIN) 12.5 mg capsule   cefdinir (OMNICEF) 300 mg capsule Take 300 mg by mouth 2 (two) times daily   No current facility-administered medications on file prior to visit.   Family History  Problem Relation Age of Onset   Hyperlipidemia (Elevated cholesterol) Mother   High blood pressure (Hypertension) Mother   Diabetes Brother    Social History   Tobacco Use  Smoking Status Never  Smokeless Tobacco Never    Social History   Socioeconomic History   Marital status: Widowed  Tobacco Use   Smoking status: Never   Smokeless tobacco: Never  Substance and Sexual Activity   Alcohol use: Never   Drug use: Never   Objective:   Vitals:  BP: 130/72  Pulse: 102  Temp: 36.5 C (97.7 F)  SpO2: 98%  Weight: 52.6 kg (116 lb)  Height: 172.7 cm ('5\' 8"'$ )   Body mass index is 17.64 kg/m.  Physical Exam   GENERAL APPEARANCE Development: normal Nutritional status: normal Gross deformities: none  SKIN Rash, lesions, ulcers: none Induration, erythema: none Nodules: none palpable  EYES Conjunctiva and lids: normal Pupils: equal and reactive Iris: normal bilaterally  EARS, NOSE, MOUTH, THROAT External ears: no lesion or deformity External nose: no lesion or deformity Hearing: grossly normal Due to Covid-19 pandemic, patient is wearing a mask.  NECK Symmetric: yes Trachea: midline Thyroid: no palpable nodules in the thyroid bed  CHEST Respiratory effort: normal Retraction or accessory muscle use: no Breath sounds: normal bilaterally Rales, rhonchi, wheeze: none  CARDIOVASCULAR Auscultation: regular rhythm, normal rate Murmurs: none Pulses: radial pulse 2+ palpable Lower extremity edema: none  MUSCULOSKELETAL Station and gait: normal Digits and nails: no clubbing or cyanosis Muscle strength: grossly normal all extremities Range of motion: grossly normal  all extremities Deformity: Arteriovenous fistula right upper arm  LYMPHATIC Cervical: none  palpable Supraclavicular: none palpable  PSYCHIATRIC Oriented to person, place, and time: yes Mood and affect: normal for situation Judgment and insight: appropriate for situation  Assessment and Plan:   Secondary hyperparathyroidism of renal origin (CMS-HCC)  Patient is referred by her nephrologist for surgical evaluation for management of secondary hyperparathyroidism. Patient is accompanied by her son.  Patient has biochemical evidence of secondary hyperparathyroidism. She has failed Sensipar. She has not had any complications. Today we discussed proceeding with total parathyroidectomy with autotransplantation to the forearm. We discussed the size and location of the surgical incisions. We discussed the hospital stay to be anticipated. We discussed her postoperative recovery. We discussed the potential for recurrent disease in the transplanted parathyroid tissue. The patient and her son understand and agree to proceed.  Prior to surgery, I would like to obtain an ultrasound examination of the neck to evaluate for concurrent thyroid disease. We will make arrangements for the study in the near future.  We will need to coordinate her care with the nephrologist and the hospital medical service. We will contact the patient from our scheduling department in order to pick a date for surgery in the near future.  Armandina Gemma, MD [facility] A [place] practice Office: [phone]

## 2022-04-03 ENCOUNTER — Encounter (HOSPITAL_COMMUNITY)
Admission: RE | Disposition: A | Discharge status: Home / Self Care | Payer: Self-pay | Source: Home / Self Care | Attending: Surgery

## 2022-04-03 ENCOUNTER — Other Ambulatory Visit: Payer: Self-pay

## 2022-04-03 ENCOUNTER — Inpatient Hospital Stay (HOSPITAL_COMMUNITY): Payer: Medicare Other | Admitting: Physician Assistant

## 2022-04-03 ENCOUNTER — Inpatient Hospital Stay (HOSPITAL_COMMUNITY)
Admission: RE | Admit: 2022-04-03 | Discharge: 2022-04-15 | DRG: 674 | Disposition: A | Discharge status: Home / Self Care | Payer: Medicare Other | Attending: Surgery | Admitting: Surgery

## 2022-04-03 ENCOUNTER — Inpatient Hospital Stay (HOSPITAL_BASED_OUTPATIENT_CLINIC_OR_DEPARTMENT_OTHER): Payer: Medicare Other | Admitting: Certified Registered Nurse Anesthetist

## 2022-04-03 ENCOUNTER — Encounter (HOSPITAL_COMMUNITY): Payer: Self-pay | Admitting: Surgery

## 2022-04-03 DIAGNOSIS — I132 Hypertensive heart and chronic kidney disease with heart failure and with stage 5 chronic kidney disease, or end stage renal disease: Secondary | ICD-10-CM | POA: Diagnosis present

## 2022-04-03 DIAGNOSIS — N2581 Secondary hyperparathyroidism of renal origin: Secondary | ICD-10-CM | POA: Diagnosis present

## 2022-04-03 DIAGNOSIS — N186 End stage renal disease: Secondary | ICD-10-CM | POA: Diagnosis present

## 2022-04-03 DIAGNOSIS — D631 Anemia in chronic kidney disease: Secondary | ICD-10-CM | POA: Diagnosis present

## 2022-04-03 DIAGNOSIS — Z888 Allergy status to other drugs, medicaments and biological substances status: Secondary | ICD-10-CM

## 2022-04-03 DIAGNOSIS — I5042 Chronic combined systolic (congestive) and diastolic (congestive) heart failure: Secondary | ICD-10-CM | POA: Diagnosis present

## 2022-04-03 DIAGNOSIS — K5900 Constipation, unspecified: Secondary | ICD-10-CM | POA: Diagnosis not present

## 2022-04-03 DIAGNOSIS — E213 Hyperparathyroidism, unspecified: Secondary | ICD-10-CM | POA: Diagnosis present

## 2022-04-03 DIAGNOSIS — Z83438 Family history of other disorder of lipoprotein metabolism and other lipidemia: Secondary | ICD-10-CM

## 2022-04-03 DIAGNOSIS — I509 Heart failure, unspecified: Secondary | ICD-10-CM | POA: Diagnosis not present

## 2022-04-03 DIAGNOSIS — K219 Gastro-esophageal reflux disease without esophagitis: Secondary | ICD-10-CM | POA: Diagnosis present

## 2022-04-03 DIAGNOSIS — I82612 Acute embolism and thrombosis of superficial veins of left upper extremity: Secondary | ICD-10-CM | POA: Diagnosis present

## 2022-04-03 DIAGNOSIS — Z833 Family history of diabetes mellitus: Secondary | ICD-10-CM

## 2022-04-03 DIAGNOSIS — M7989 Other specified soft tissue disorders: Secondary | ICD-10-CM | POA: Diagnosis not present

## 2022-04-03 DIAGNOSIS — Z8249 Family history of ischemic heart disease and other diseases of the circulatory system: Secondary | ICD-10-CM

## 2022-04-03 DIAGNOSIS — Z992 Dependence on renal dialysis: Secondary | ICD-10-CM

## 2022-04-03 DIAGNOSIS — E0590 Thyrotoxicosis, unspecified without thyrotoxic crisis or storm: Secondary | ICD-10-CM | POA: Diagnosis present

## 2022-04-03 HISTORY — PX: PARATHYROIDECTOMY: SHX19

## 2022-04-03 LAB — HEPATITIS B SURFACE ANTIGEN: Hepatitis B Surface Ag: NONREACTIVE

## 2022-04-03 LAB — BASIC METABOLIC PANEL
Anion gap: 18 — ABNORMAL HIGH (ref 5–15)
Anion gap: 18 — ABNORMAL HIGH (ref 5–15)
Anion gap: 19 — ABNORMAL HIGH (ref 5–15)
Anion gap: 19 — ABNORMAL HIGH (ref 5–15)
BUN: 23 mg/dL (ref 8–23)
BUN: 32 mg/dL — ABNORMAL HIGH (ref 8–23)
BUN: 34 mg/dL — ABNORMAL HIGH (ref 8–23)
BUN: 37 mg/dL — ABNORMAL HIGH (ref 8–23)
CO2: 26 mmol/L (ref 22–32)
CO2: 27 mmol/L (ref 22–32)
CO2: 26 mmol/L (ref 22–32)
CO2: 26 mmol/L (ref 22–32)
Calcium: 10.8 mg/dL — ABNORMAL HIGH (ref 8.9–10.3)
Calcium: 7.7 mg/dL — ABNORMAL LOW (ref 8.9–10.3)
Calcium: 7.3 mg/dL — ABNORMAL LOW (ref 8.9–10.3)
Calcium: 7.2 mg/dL — ABNORMAL LOW (ref 8.9–10.3)
Chloride: 95 mmol/L — ABNORMAL LOW (ref 98–111)
Chloride: 94 mmol/L — ABNORMAL LOW (ref 98–111)
Chloride: 93 mmol/L — ABNORMAL LOW (ref 98–111)
Chloride: 92 mmol/L — ABNORMAL LOW (ref 98–111)
Creatinine, Ser: 6.74 mg/dL — ABNORMAL HIGH (ref 0.44–1.00)
Creatinine, Ser: 7.35 mg/dL — ABNORMAL HIGH (ref 0.44–1.00)
Creatinine, Ser: 7.77 mg/dL — ABNORMAL HIGH (ref 0.44–1.00)
Creatinine, Ser: 7.94 mg/dL — ABNORMAL HIGH (ref 0.44–1.00)
GFR, Estimated: 6 mL/min — ABNORMAL LOW (ref >60)
GFR, Estimated: 6 mL/min — ABNORMAL LOW (ref >60)
GFR, Estimated: 5 mL/min — ABNORMAL LOW (ref >60)
GFR, Estimated: 5 mL/min — ABNORMAL LOW (ref >60)
Glucose, Bld: 87 mg/dL (ref 70–99)
Glucose, Bld: 129 mg/dL — ABNORMAL HIGH (ref 70–99)
Glucose, Bld: 121 mg/dL — ABNORMAL HIGH (ref 70–99)
Glucose, Bld: 107 mg/dL — ABNORMAL HIGH (ref 70–99)
Potassium: 3.7 mmol/L (ref 3.5–5.1)
Potassium: 4.4 mmol/L (ref 3.5–5.1)
Potassium: 4.9 mmol/L (ref 3.5–5.1)
Potassium: 4.4 mmol/L (ref 3.5–5.1)
Sodium: 139 mmol/L (ref 135–145)
Sodium: 139 mmol/L (ref 135–145)
Sodium: 138 mmol/L (ref 135–145)
Sodium: 137 mmol/L (ref 135–145)

## 2022-04-03 LAB — RENAL FUNCTION PANEL
Albumin: 3.3 g/dL — ABNORMAL LOW (ref 3.5–5.0)
Anion gap: 16 — ABNORMAL HIGH (ref 5–15)
BUN: 33 mg/dL — ABNORMAL HIGH (ref 8–23)
CO2: 28 mmol/L (ref 22–32)
Calcium: 7.8 mg/dL — ABNORMAL LOW (ref 8.9–10.3)
Chloride: 95 mmol/L — ABNORMAL LOW (ref 98–111)
Creatinine, Ser: 7.33 mg/dL — ABNORMAL HIGH (ref 0.44–1.00)
GFR, Estimated: 6 mL/min — ABNORMAL LOW (ref >60)
Glucose, Bld: 129 mg/dL — ABNORMAL HIGH (ref 70–99)
Phosphorus: 5.4 mg/dL — ABNORMAL HIGH (ref 2.5–4.6)
Potassium: 4.4 mmol/L (ref 3.5–5.1)
Sodium: 139 mmol/L (ref 135–145)

## 2022-04-03 LAB — HEPATITIS B SURFACE ANTIBODY,QUALITATIVE: Hep B S Ab: REACTIVE — AB

## 2022-04-03 LAB — HEPATITIS B CORE ANTIBODY, TOTAL: Hep B Core Total Ab: NONREACTIVE

## 2022-04-03 LAB — HEPATITIS C ANTIBODY: HCV Ab: NONREACTIVE

## 2022-04-03 SURGERY — PARATHYROIDECTOMY
Anesthesia: General | Site: Neck

## 2022-04-03 MED ORDER — BUPIVACAINE HCL (PF) 0.25 % IJ SOLN
INTRAMUSCULAR | Status: AC
  Filled 2022-04-03: qty 30

## 2022-04-03 MED ORDER — FENTANYL CITRATE (PF) 100 MCG/2ML IJ SOLN
INTRAMUSCULAR | Status: AC
  Filled 2022-04-03: qty 2

## 2022-04-03 MED ORDER — CHLORHEXIDINE GLUCONATE CLOTH 2 % EX PADS
6.0000 | MEDICATED_PAD | Freq: Every day | CUTANEOUS | Status: DC
  Administered 2022-04-04 – 2022-04-06 (×3): 6 via TOPICAL

## 2022-04-03 MED ORDER — ACETAMINOPHEN 325 MG PO TABS
650.0000 mg | ORAL_TABLET | Freq: Four times a day (QID) | ORAL | Status: DC | PRN
  Administered 2022-04-07 – 2022-04-09 (×2): 650 mg via ORAL
  Filled 2022-04-03 (×2): qty 2

## 2022-04-03 MED ORDER — MIDAZOLAM HCL 2 MG/2ML IJ SOLN
INTRAMUSCULAR | Status: DC | PRN
  Administered 2022-04-03 (×2): 1 mg via INTRAVENOUS

## 2022-04-03 MED ORDER — LIDOCAINE 2% (20 MG/ML) 5 ML SYRINGE
INTRAMUSCULAR | Status: AC
Start: 2022-04-03 — End: ?
  Filled 2022-04-03: qty 5

## 2022-04-03 MED ORDER — ONDANSETRON HCL 4 MG/2ML IJ SOLN: 4.0000 mg | Freq: Four times a day (QID) | INTRAMUSCULAR | Status: DC | PRN

## 2022-04-03 MED ORDER — BUPIVACAINE HCL (PF) 0.25 % IJ SOLN
INTRAMUSCULAR | Status: DC | PRN
  Administered 2022-04-03: 8 mL

## 2022-04-03 MED ORDER — PROPOFOL 10 MG/ML IV BOLUS
INTRAVENOUS | Status: AC
  Filled 2022-04-03: qty 20

## 2022-04-03 MED ORDER — ONDANSETRON 4 MG PO TBDP: 4.0000 mg | ORAL_TABLET | Freq: Four times a day (QID) | ORAL | Status: DC | PRN

## 2022-04-03 MED ORDER — HYDRALAZINE HCL 25 MG PO TABS
25.0000 mg | ORAL_TABLET | Freq: Three times a day (TID) | ORAL | Status: DC
  Administered 2022-04-03 – 2022-04-11 (×17): 25 mg via ORAL
  Filled 2022-04-03 (×20): qty 1

## 2022-04-03 MED ORDER — CHLORHEXIDINE GLUCONATE CLOTH 2 % EX PADS: 6.0000 | MEDICATED_PAD | Freq: Once | CUTANEOUS | Status: DC

## 2022-04-03 MED ORDER — FENTANYL CITRATE (PF) 250 MCG/5ML IJ SOLN
INTRAMUSCULAR | Status: AC
  Filled 2022-04-03: qty 5

## 2022-04-03 MED ORDER — DROPERIDOL 2.5 MG/ML IJ SOLN: 0.6250 mg | Freq: Once | INTRAMUSCULAR | Status: DC | PRN

## 2022-04-03 MED ORDER — METOPROLOL SUCCINATE ER 100 MG PO TB24
100.0000 mg | ORAL_TABLET | Freq: Every day | ORAL | Status: DC
  Administered 2022-04-03 – 2022-04-12 (×7): 100 mg via ORAL
  Filled 2022-04-03 (×9): qty 1

## 2022-04-03 MED ORDER — CALCIUM GLUCONATE-NACL 1-0.675 GM/50ML-% IV SOLN
1.0000 g | Freq: Once | INTRAVENOUS | Status: AC
  Administered 2022-04-03: 1000 mg via INTRAVENOUS
  Filled 2022-04-03: qty 50

## 2022-04-03 MED ORDER — ISOSORBIDE MONONITRATE ER 30 MG PO TB24
30.0000 mg | ORAL_TABLET | Freq: Every day | ORAL | Status: DC
  Administered 2022-04-03 – 2022-04-15 (×12): 30 mg via ORAL
  Filled 2022-04-03 (×13): qty 1

## 2022-04-03 MED ORDER — SUGAMMADEX SODIUM 200 MG/2ML IV SOLN
INTRAVENOUS | Status: DC | PRN
  Administered 2022-04-03: 150 mg via INTRAVENOUS

## 2022-04-03 MED ORDER — CHLORHEXIDINE GLUCONATE 0.12 % MT SOLN
15.0000 mL | Freq: Once | OROMUCOSAL | Status: AC
  Administered 2022-04-03: 15 mL via OROMUCOSAL
  Filled 2022-04-03: qty 15

## 2022-04-03 MED ORDER — CALCIUM CARBONATE ANTACID 500 MG PO CHEW
400.0000 mg | CHEWABLE_TABLET | Freq: Three times a day (TID) | ORAL | Status: DC
  Administered 2022-04-03 – 2022-04-07 (×12): 400 mg via ORAL
  Filled 2022-04-03 (×12): qty 2

## 2022-04-03 MED ORDER — CALCITRIOL 0.5 MCG PO CAPS
0.5000 ug | ORAL_CAPSULE | Freq: Every day | ORAL | Status: DC
  Administered 2022-04-03 – 2022-04-04 (×2): 0.5 ug via ORAL
  Filled 2022-04-03 (×3): qty 1

## 2022-04-03 MED ORDER — ROCURONIUM BROMIDE 10 MG/ML (PF) SYRINGE
PREFILLED_SYRINGE | INTRAVENOUS | Status: AC
  Filled 2022-04-03: qty 10

## 2022-04-03 MED ORDER — ROCURONIUM BROMIDE 10 MG/ML (PF) SYRINGE
PREFILLED_SYRINGE | INTRAVENOUS | Status: DC | PRN
  Administered 2022-04-03: 10 mg via INTRAVENOUS
  Administered 2022-04-03: 40 mg via INTRAVENOUS

## 2022-04-03 MED ORDER — ONDANSETRON HCL 4 MG/2ML IJ SOLN
INTRAMUSCULAR | Status: DC | PRN
  Administered 2022-04-03: 4 mg via INTRAVENOUS

## 2022-04-03 MED ORDER — LIDOCAINE 2% (20 MG/ML) 5 ML SYRINGE
INTRAMUSCULAR | Status: DC | PRN
  Administered 2022-04-03: 40 mg via INTRAVENOUS

## 2022-04-03 MED ORDER — FENTANYL CITRATE (PF) 100 MCG/2ML IJ SOLN
25.0000 ug | INTRAMUSCULAR | Status: DC | PRN
  Administered 2022-04-03: 50 ug via INTRAVENOUS
  Administered 2022-04-03: 25 ug via INTRAVENOUS

## 2022-04-03 MED ORDER — SODIUM CHLORIDE 0.9 % IV SOLN: INTRAVENOUS | Status: DC

## 2022-04-03 MED ORDER — PHENYLEPHRINE HCL-NACL 20-0.9 MG/250ML-% IV SOLN
INTRAVENOUS | Status: DC | PRN
  Administered 2022-04-03: 25 ug/min via INTRAVENOUS

## 2022-04-03 MED ORDER — ORAL CARE MOUTH RINSE: 15.0000 mL | Freq: Once | OROMUCOSAL | Status: AC

## 2022-04-03 MED ORDER — DEXAMETHASONE SODIUM PHOSPHATE 10 MG/ML IJ SOLN
INTRAMUSCULAR | Status: AC
  Filled 2022-04-03: qty 1

## 2022-04-03 MED ORDER — DEXAMETHASONE SODIUM PHOSPHATE 10 MG/ML IJ SOLN
INTRAMUSCULAR | Status: DC | PRN
  Administered 2022-04-03: 4 mg via INTRAVENOUS

## 2022-04-03 MED ORDER — SODIUM CHLORIDE 0.9 % IV SOLN
2.0000 mg/kg/h | INTRAVENOUS | Status: DC
  Administered 2022-04-03: 1.5 mg/kg/h via INTRAVENOUS
  Administered 2022-04-04: 2 mg/kg/h via INTRAVENOUS
  Filled 2022-04-03 (×3): qty 100

## 2022-04-03 MED ORDER — PHENYLEPHRINE 80 MCG/ML (10ML) SYRINGE FOR IV PUSH (FOR BLOOD PRESSURE SUPPORT)
PREFILLED_SYRINGE | INTRAVENOUS | Status: AC
  Filled 2022-04-03: qty 10

## 2022-04-03 MED ORDER — TRAMADOL HCL 50 MG PO TABS
50.0000 mg | ORAL_TABLET | Freq: Four times a day (QID) | ORAL | Status: DC | PRN
  Administered 2022-04-04: 50 mg via ORAL
  Filled 2022-04-03: qty 1

## 2022-04-03 MED ORDER — PHENYLEPHRINE 80 MCG/ML (10ML) SYRINGE FOR IV PUSH (FOR BLOOD PRESSURE SUPPORT)
PREFILLED_SYRINGE | INTRAVENOUS | Status: DC | PRN
  Administered 2022-04-03 (×5): 80 ug via INTRAVENOUS

## 2022-04-03 MED ORDER — ACETAMINOPHEN 650 MG RE SUPP: 650.0000 mg | Freq: Four times a day (QID) | RECTAL | Status: DC | PRN

## 2022-04-03 MED ORDER — OXYCODONE HCL 5 MG PO TABS
5.0000 mg | ORAL_TABLET | ORAL | Status: DC | PRN
  Administered 2022-04-03: 10 mg via ORAL
  Filled 2022-04-03: qty 2

## 2022-04-03 MED ORDER — HEMOSTATIC AGENTS (NO CHARGE) OPTIME
TOPICAL | Status: DC | PRN
  Administered 2022-04-03: 1 via TOPICAL

## 2022-04-03 MED ORDER — OXYCODONE HCL 5 MG/5ML PO SOLN: 5.0000 mg | Freq: Once | ORAL | Status: DC | PRN

## 2022-04-03 MED ORDER — PROPOFOL 10 MG/ML IV BOLUS
INTRAVENOUS | Status: DC | PRN
  Administered 2022-04-03: 30 mg via INTRAVENOUS
  Administered 2022-04-03: 70 mg via INTRAVENOUS
  Administered 2022-04-03: 20 mg via INTRAVENOUS

## 2022-04-03 MED ORDER — FENTANYL CITRATE (PF) 250 MCG/5ML IJ SOLN
INTRAMUSCULAR | Status: DC | PRN
Start: 2022-04-03 — End: 2022-04-03
  Administered 2022-04-03: 50 ug via INTRAVENOUS
  Administered 2022-04-03: 25 ug via INTRAVENOUS
  Administered 2022-04-03: 50 ug via INTRAVENOUS

## 2022-04-03 MED ORDER — HYDROMORPHONE HCL 1 MG/ML IJ SOLN
1.0000 mg | INTRAMUSCULAR | Status: DC | PRN
  Administered 2022-04-03: 1 mg via INTRAVENOUS
  Filled 2022-04-03: qty 1

## 2022-04-03 MED ORDER — MIDAZOLAM HCL 2 MG/2ML IJ SOLN
INTRAMUSCULAR | Status: AC
Start: 2022-04-03 — End: ?
  Filled 2022-04-03: qty 2

## 2022-04-03 MED ORDER — 0.9 % SODIUM CHLORIDE (POUR BTL) OPTIME
TOPICAL | Status: DC | PRN
  Administered 2022-04-03 (×2): 1000 mL

## 2022-04-03 MED ORDER — ACETAMINOPHEN 500 MG PO TABS
1000.0000 mg | ORAL_TABLET | Freq: Once | ORAL | Status: AC
Start: 2022-04-03 — End: 2022-04-03
  Administered 2022-04-03: 1000 mg via ORAL
  Filled 2022-04-03: qty 2

## 2022-04-03 MED ORDER — ONDANSETRON HCL 4 MG/2ML IJ SOLN
INTRAMUSCULAR | Status: AC
  Filled 2022-04-03: qty 2

## 2022-04-03 MED ORDER — CIPROFLOXACIN IN D5W 400 MG/200ML IV SOLN
400.0000 mg | INTRAVENOUS | Status: AC
  Administered 2022-04-03: 400 mg via INTRAVENOUS
  Filled 2022-04-03: qty 200

## 2022-04-03 MED ORDER — EPHEDRINE SULFATE-NACL 50-0.9 MG/10ML-% IV SOSY
PREFILLED_SYRINGE | INTRAVENOUS | Status: DC | PRN
  Administered 2022-04-03 (×2): 5 mg via INTRAVENOUS

## 2022-04-03 MED ORDER — OXYCODONE HCL 5 MG PO TABS: 5.0000 mg | ORAL_TABLET | Freq: Once | ORAL | Status: DC | PRN

## 2022-04-03 SURGICAL SUPPLY — 52 items
BAG COUNTER SPONGE SURGICOUNT (BAG) ×2 IMPLANT
BLADE SURG 15 STRL LF DISP TIS (BLADE) ×1 IMPLANT
BLADE SURG 15 STRL SS (BLADE) ×1
CANISTER SUCT 3000ML PPV (MISCELLANEOUS) ×2 IMPLANT
CHLORAPREP W/TINT 26 (MISCELLANEOUS) ×2 IMPLANT
CLIP VESOCCLUDE MED 6/CT (CLIP) ×2 IMPLANT
CLIP VESOCCLUDE SM WIDE 6/CT (CLIP) ×2 IMPLANT
CNTNR URN SCR LID CUP LEK RST (MISCELLANEOUS) ×1 IMPLANT
CONT SPEC 4OZ STRL OR WHT (MISCELLANEOUS) ×8
COVER SURGICAL LIGHT HANDLE (MISCELLANEOUS) ×2 IMPLANT
DERMABOND ADVANCED (GAUZE/BANDAGES/DRESSINGS) ×1
DERMABOND ADVANCED .7 DNX12 (GAUZE/BANDAGES/DRESSINGS) IMPLANT
DRAPE LAPAROTOMY 100X72 PEDS (DRAPES) ×2 IMPLANT
DRAPE SLUSH MACHINE 52X66 (DRAPES) ×1 IMPLANT
DRAPE UTILITY XL STRL (DRAPES) ×2 IMPLANT
ELECT CAUTERY BLADE 6.4 (BLADE) ×2 IMPLANT
ELECT REM PT RETURN 9FT ADLT (ELECTROSURGICAL) ×2
ELECTRODE REM PT RTRN 9FT ADLT (ELECTROSURGICAL) ×1 IMPLANT
GAUZE 4X4 16PLY ~~LOC~~+RFID DBL (SPONGE) ×2 IMPLANT
GAUZE SPONGE 2X2 8PLY STRL LF (GAUZE/BANDAGES/DRESSINGS) ×1 IMPLANT
GLOVE SURG ORTHO 8.0 STRL STRW (GLOVE) ×2 IMPLANT
GOWN STRL REUS W/ TWL LRG LVL3 (GOWN DISPOSABLE) ×1 IMPLANT
GOWN STRL REUS W/ TWL XL LVL3 (GOWN DISPOSABLE) ×1 IMPLANT
GOWN STRL REUS W/TWL LRG LVL3 (GOWN DISPOSABLE) ×1
GOWN STRL REUS W/TWL XL LVL3 (GOWN DISPOSABLE) ×1
HEMOSTAT ARISTA ABSORB 3G PWDR (HEMOSTASIS) IMPLANT
HEMOSTAT SURGICEL 2X4 FIBR (HEMOSTASIS) ×2 IMPLANT
KIT BASIN OR (CUSTOM PROCEDURE TRAY) ×2 IMPLANT
KIT TURNOVER KIT B (KITS) ×2 IMPLANT
NDL HYPO 25GX1X1/2 BEV (NEEDLE) ×1 IMPLANT
NEEDLE HYPO 25GX1X1/2 BEV (NEEDLE) ×2 IMPLANT
NS IRRIG 1000ML POUR BTL (IV SOLUTION) ×2 IMPLANT
PACK SURGICAL SETUP 50X90 (CUSTOM PROCEDURE TRAY) ×2 IMPLANT
PAD ARMBOARD 7.5X6 YLW CONV (MISCELLANEOUS) ×2 IMPLANT
PENCIL BUTTON HOLSTER BLD 10FT (ELECTRODE) ×2 IMPLANT
POSITIONER HEAD DONUT 9IN (MISCELLANEOUS) ×2 IMPLANT
SPONGE GAUZE 2X2 STER 10/PKG (GAUZE/BANDAGES/DRESSINGS) ×1
SPONGE INTESTINAL PEANUT (DISPOSABLE) IMPLANT
STRIP CLOSURE SKIN 1/2X4 (GAUZE/BANDAGES/DRESSINGS) ×2 IMPLANT
SUT MNCRL AB 4-0 PS2 18 (SUTURE) ×2 IMPLANT
SUT PROLENE 4 0 RB 1 (SUTURE) ×1
SUT PROLENE 4-0 RB1 .5 CRCL 36 (SUTURE) IMPLANT
SUT SILK 2 0 (SUTURE)
SUT SILK 2-0 18XBRD TIE 12 (SUTURE) IMPLANT
SUT SILK 3 0 (SUTURE)
SUT SILK 3-0 18XBRD TIE 12 (SUTURE) IMPLANT
SUT VIC AB 3-0 SH 18 (SUTURE) ×2 IMPLANT
SYR BULB EAR ULCER 3OZ GRN STR (SYRINGE) ×2 IMPLANT
SYR CONTROL 10ML LL (SYRINGE) ×2 IMPLANT
TOWEL GREEN STERILE (TOWEL DISPOSABLE) ×2 IMPLANT
TOWEL GREEN STERILE FF (TOWEL DISPOSABLE) ×2 IMPLANT
TUBE CONNECTING 12X1/4 (SUCTIONS) ×2 IMPLANT

## 2022-04-03 NOTE — Interval H&P Note (Signed)
History and Physical Interval Note:  04/03/2022 7:03 AM  Cynthia Bray  has presented today for surgery, with the diagnosis of SECONDARY HYPERPARATHYROIDISM.  The various methods of treatment have been discussed with the patient and family. After consideration of risks, benefits and other options for treatment, the patient has consented to    Procedure(s): TOTAL PARATHYROIDECTOMY WITH AUTOTRANSPLANT TO LEFT FOREARM (N/A) as a surgical intervention.    The patient's history has been reviewed, patient examined, no change in status, stable for surgery.  I have reviewed the patient's chart and labs.  Questions were answered to the patient's satisfaction.    Armandina Gemma, [place] [facility] A [employer] practice Office: [place]

## 2022-04-03 NOTE — Consult Note (Signed)
 [facility] Renal Consultation Note    Indication for Consultation:  Management of ESRD/hemodialysis; anemia, hypertension/volume and secondary hyperparathyroidism PCP:  HPI: Cynthia Bray is a 61 y.o. female with ESRD on hemodialysis MWF at [facility]. PMH: HTN, combined systolic and diastolic HF (EF 74-69% G3DD 05/03/2021), FFT, anemia of ESRD, severe secondary hyperparathyroidism. PTH range has been [id number] since 12/2021. Ca+ has been upper range of normal, mostly 10. She has been on max dose Parsabiv at HD center.   She was taken to OR today per Dr. Loise for L superior and inferior, R superior parathyroidectomy (R was enlarged, probable adenoma). R inferior gland was left in situ. Seen in room. She is alert, oriented X 3. She is eating clear liquids, denies C/O pain, SOB. Son at bedside.   Past Medical History:  Diagnosis Date   CHF (congestive heart failure) (HCC)    EF 25%   ESRD (end stage renal disease) (HCC)    GERD (gastroesophageal reflux disease)    Hypertension    Hyperthyroidism    Renal disorder    Past Surgical History:  Procedure Laterality Date   AV FISTULA PLACEMENT     CARDIAC CATHETERIZATION     VASCULAR SURGERY     fistula bilaterally. left arm removed   Family History  Problem Relation Age of Onset   Kidney disease Mother    Social History:  reports that she has never smoked. She has never used smokeless tobacco. She reports that she does not drink alcohol and does not use drugs. Allergies  Allergen Reactions   Sensipar  [Cinacalcet ] Nausea And Vomiting   Aplisol [Tuberculin, Ppd] Swelling   Lisinopril Swelling    Angioedema    Penicillin G Swelling   Prior to Admission medications   Medication Sig Start Date End Date Taking? Authorizing Provider  aspirin  EC 81 MG tablet Take 81 mg by mouth daily. Swallow whole.   Yes [provider]  atorvastatin  (LIPITOR) 40 MG tablet Take 40 mg by mouth every evening. 04/20/21   Yes [provider]  benzonatate  (TESSALON ) 100 MG capsule Take 1 capsule (100 mg total) by mouth every 8 (eight) hours. Patient taking differently: Take 100 mg by mouth 3 (three) times daily as needed for cough. 06/17/21  Yes Aberman, Caroline C, PA-C  diclofenac  Sodium (VOLTAREN ) 1 % GEL Apply 4 g topically 4 (four) times daily. Patient taking differently: Apply 4 g topically 4 (four) times daily as needed (pain). 05/14/21  Yes Emil Share, DO  hydrALAZINE  (APRESOLINE ) 25 MG tablet Take 1 tablet (25 mg total) by mouth every 8 (eight) hours. 05/10/21  Yes Cheryle Page, MD  ibuprofen  (ADVIL ) 600 MG tablet Take 1 tablet (600 mg total) by mouth every 6 (six) hours as needed. 02/06/22  Yes Elnor Hila P, DO  isosorbide  mononitrate (IMDUR ) 30 MG 24 hr tablet Take 1 tablet (30 mg total) by mouth daily. 05/10/21  Yes Cheryle Page, MD  lidocaine -prilocaine  (EMLA ) cream Apply 1 application topically See admin instructions. Apply small amount to access site 1-2 hours before dialysis. 05/02/21  Yes [provider]  metoprolol  succinate (TOPROL -XL) 100 MG 24 hr tablet Take 1 tablet (100 mg total) by mouth daily. Take with or immediately following a meal. 05/10/21  Yes Cheryle Page, MD  multivitamin (RENA-VIT) TABS tablet Take 1 tablet by mouth at bedtime. 05/10/21  Yes Cheryle Page, MD  sucroferric oxyhydroxide (VELPHORO) 500 MG chewable tablet Chew 500 mg by mouth 3 (three) times daily with meals.  05/03/21  Yes [provider]   Current Facility-Administered Medications  Medication Dose Route Frequency Provider Last Rate Last Admin   0.9 %  sodium chloride  infusion   Intravenous Continuous Eletha Boas, MD       acetaminophen  (TYLENOL ) tablet 650 mg  650 mg Oral Q6H PRN Eletha Boas, MD       Or   acetaminophen  (TYLENOL ) suppository 650 mg  650 mg Rectal Q6H PRN Eletha Boas, MD       fentaNYL  (SUBLIMAZE ) 100 MCG/2ML injection            hydrALAZINE  (APRESOLINE ) tablet 25 mg  25 mg  Oral Q8H Gerkin, Todd, MD   25 mg at 04/03/22 1409   HYDROmorphone  (DILAUDID ) injection 1 mg  1 mg Intravenous Q2H PRN Eletha Boas, MD   1 mg at 04/03/22 1249   isosorbide  mononitrate (IMDUR ) 24 hr tablet 30 mg  30 mg Oral Daily Gerkin, Todd, MD   30 mg at 04/03/22 1249   metoprolol  succinate (TOPROL -XL) 24 hr tablet 100 mg  100 mg Oral Daily Gerkin, Todd, MD   100 mg at 04/03/22 1249   ondansetron  (ZOFRAN -ODT) disintegrating tablet 4 mg  4 mg Oral Q6H PRN Eletha Boas, MD       Or   ondansetron  (ZOFRAN ) injection 4 mg  4 mg Intravenous Q6H PRN Eletha Boas, MD       oxyCODONE  (Oxy IR/ROXICODONE ) immediate release tablet 5-10 mg  5-10 mg Oral Q4H PRN Eletha Boas, MD       traMADol  (ULTRAM ) tablet 50 mg  50 mg Oral Q6H PRN Eletha Boas, MD       Labs: Basic Metabolic Panel: Recent Labs  Lab 04/03/22 0613  NA 139  K 3.7  CL 95*  CO2 26  GLUCOSE 87  BUN 23  CREATININE 6.74*  CALCIUM  10.8*   Liver Function Tests: No results for input(s): AST, ALT, ALKPHOS, BILITOT, PROT, ALBUMIN in the last 168 hours. No results for input(s): LIPASE, AMYLASE in the last 168 hours. No results for input(s): AMMONIA in the last 168 hours. CBC: No results for input(s): WBC, NEUTROABS, HGB, HCT, MCV, PLT in the last 168 hours. Cardiac Enzymes: No results for input(s): CKTOTAL, CKMB, CKMBINDEX, TROPONINI in the last 168 hours. CBG: No results for input(s): GLUCAP in the last 168 hours. Iron Studies: No results for input(s): IRON, TIBC, TRANSFERRIN, FERRITIN in the last 72 hours. Studies/Results: No results found.  ROS: As per HPI otherwise negative.  Physical Exam: Vitals:   04/03/22 1035 04/03/22 1050 04/03/22 1105 04/03/22 1130  BP: 121/71 115/67 118/72 132/73  Pulse: 83 82 85 83  Resp: 13 12 12 18   Temp:   97.8 F (36.6 C) 98 F (36.7 C)  TempSrc:      SpO2: 100% 100% 100% 100%  Weight:      Height:         General: Frail appearing  female who looks older than stated age in no acute distress. Head: Normocephalic, atraumatic, sclera non-icteric, mucus membranes are moist Neck: Supple. JVD not elevated. Mid trachea incision clean and intact.  Lungs: Clear bilaterally to auscultation without wheezes, rales, or rhonchi. Breathing is unlabored. Heart: S1,S2 2/6 systolic M. RRR Abdomen: Soft, non-tender, non-distended with normoactive bowel sounds. No rebound/guarding. No obvious abdominal masses. M-S:  Strength and tone appear normal for age. Lower extremities:without edema or ischemic changes, no open wounds  Neuro: Alert and oriented X 3. Moves all extremities spontaneously. Psych:  Responds  to questions appropriately with a normal affect. Dialysis Access: RUA AVG +T/B  Dialysis Orders: Center: [facility] MWF 3:45 hr 160NRe 350/500 52 kg 2.0 K/2.0 Ca AVG -Heparin  2000 units IV TIW -Parsabiv 15 mg IV TIW discontinue on discharge   Assessment/Plan:  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 Drema. 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 All lobes sent for pathology.  Metabolic bone disease -  Ca 10.8 this AM. Check BMPs q 4 hours. Start Calcitriol  0.5 mcg PO daily even though Ca+ is high as anticipating precipitous fall in Ca+ level post op. Add Tums 2 chews q 8 hours.  Hold binders.   ESRD -  MWF next HD 04/03/2022. Hold heparin . 3.0 K bath.  Hypertension/volume  - BP well controlled. Home BP meds have been resumed. Without evidence of volume overload per exam. UF as tolerated.   Anemia  - Last HGB 10.6 04/02/2022. Not on ESA. Follow labs.   Nutrition - Last albumin 3.9 04/02/2022. H/O FFT. On megace .   Nachum Derossett H. Delores, NP-C 04/03/2022, 2:32 PM  [employer] [phone]

## 2022-04-03 NOTE — Anesthesia Procedure Notes (Signed)
Procedure Name: Intubation Date/Time: 04/03/2022 7:47 AM  Performed by: Carolan Clines, CRNAPre-anesthesia Checklist: Patient identified, Emergency Drugs available, Suction available and Patient being monitored Patient Re-evaluated:Patient Re-evaluated prior to induction Oxygen Delivery Method: Circle System Utilized Preoxygenation: Pre-oxygenation with 100% oxygen Induction Type: IV induction Ventilation: Mask ventilation without difficulty Laryngoscope Size: Mac and 3 Grade View: Grade II Tube type: Oral Tube size: 7.0 mm Number of attempts: 1 Airway Equipment and Method: Stylet Placement Confirmation: ETT inserted through vocal cords under direct vision, positive ETCO2 and breath sounds checked- equal and bilateral Secured at: 22 cm Tube secured with: Tape Dental Injury: Teeth and Oropharynx as per pre-operative assessment

## 2022-04-03 NOTE — Op Note (Signed)
Operative Note  Pre-operative Diagnosis:  secondary hyperparathyroidism, ESRD   Post-operative Diagnosis:  same  Surgeon:  Armandina Gemma, MD  Assistant:  none   Procedure:  1. Neck exploration  2. Left superior parathyroidectomy (normal)  3. Left inferior parathyroidectomy (normal)  4. Right superior parathyroidectomy (enlarged, probable adenoma)  5. Suture marking of right inferior parathyroid (normal)  Anesthesia:  general  Estimated Blood Loss:  minimal  Drains: none         Specimen: parathyroid tissue to pathology for frozen sections and permanent evaluation  Indications:  Patient is referred by Dr. Otelia Santee for surgical evaluation and management of secondary hyperparathyroidism of renal origin. Patient has been on hemodialysis for approximately 6 years. She dialyzes at the kidney center in [facility] on Mondays Wednesdays and Fridays. She currently has an access in the right upper extremity. Recent laboratories show a calcium level of 9.9, phosphorus level of 5.7, and a parathyroid hormone level of 2327.  Patient now comes to surgery for neck exploration and parathyroidectomy.  Procedure:  The patient was seen in the pre-op holding area. The risks, benefits, complications, treatment options, and expected outcomes were previously discussed with the patient. The patient agreed with the proposed plan and has signed the informed consent form.  The patient was brought to the operating room by the surgical team, identified as Cynthia Bray and the procedure verified. A "time out" was completed and the above information confirmed.  Following induction of general anesthesia, the patient is positioned and then prepped and draped in the usual aseptic fashion.  After ascertaining that an adequate level of anesthesia been achieved, a small Kocher incision is made with a #15 blade.  Dissection is carried through subcutaneous tissues and platysma.  Hemostasis is achieved with the electrocautery.  Skin  flaps are elevated cephalad and caudad from the thyroid notch to the sternal notch.  Horner self-retaining retractors placed for exposure.  Strap muscles are incised in the midline.  Dissection is begun on the left side.  Strap muscles are reflected laterally exposing a normal-appearing left thyroid lobe.  Lobe is gently mobilized.  Venous tributaries are divided between small ligaclips.  Gland is rolled anteriorly.  Posterior to the superior pole of the thyroid is a normal-appearing parathyroid gland.  This is gently dissected out.  Vascular pedicles divided between small ligaclips and the gland is excised.  Biopsy shows normal parathyroid tissue.  Remainder the gland is placed in ice saline on the back table.  Continued dissection on the left side reveals what appears to be thymic tissue in the lower portion of the thyroid thymic tract.  A normal-appearing parathyroid gland is identified on the inferior pole of the thyroid lobe.  This is dissected out and the vascular pedicle divided between small ligaclips and the gland is excised.  Biopsy is submitted for frozen section and confirms parathyroid tissue which appears relatively normal and not hypercellular.  Remainder the gland is placed in ice saline on the back table.  Next we turned our attention to the right side.  Strap muscles are reflected laterally.  Right thyroid lobe is mobilized.  Right lobe of the thyroid appears normal.  Posterior to the superior pole is a large mass measuring approximately 3 cm in diameter consistent with an enlarged parathyroid gland or possibly a parathyroid adenoma.  This is gently dissected out taking care to avoid the underlying structures.  Vascular structures are divided between small and medium ligaclips and the entire gland is excised.  Frozen section biopsy confirms hypercellular parathyroid tissue.  Additional dissection on the right reveals a normal-appearing right inferior parathyroid gland just inferior to and  lateral to the inferior pole of the thyroid.  At this point a decision is made not to perform total parathyroidectomy.  It would appear that the right superior gland was hypercellular and likely an adenoma.  The other glands appear normal.  2 of these glands have already been resected.  A decision was made to leave the right inferior gland in situ.  This gland was marked with a 4-0 Prolene suture.  Neck is irrigated with warm saline.  Good hemostasis is noted throughout the operative field.  Fibrillar is placed throughout the operative field.  Strap muscles are reapproximated in the midline of interrupted 3-0 Vicryl sutures.  Platysma is closed with interrupted 3-0 Vicryl sutures.  Skin is closed with a running 4-0 Monocryl subcuticular suture.  Wound is washed and dried and Dermabond is applied as dressing.  The remaining parathyroid tissue from the left superior gland, the left inferior gland, and the right superior gland are all submitted to pathology for permanent review.  Dr. Hollie Salk from nephrology is notified of the operative findings and of the patient's admission.  Patient is awakened from anesthesia and transported to the recovery room.  The patient tolerated the procedure well.  Armandina Gemma, [facility] Office: [phone]

## 2022-04-03 NOTE — Anesthesia Postprocedure Evaluation (Signed)
Anesthesia Post Note  Patient: Cynthia Bray  Procedure(s) Performed: NECK EXPLORATION, LEFT SUPERIOR PARATHYROIDECTOMY, LEFT INFERIOR PARATHYROIDECTOMY, RIGHT SUPERIOR PARATHYROIDECTOMY (Neck)     Patient location during evaluation: PACU Anesthesia Type: General Level of consciousness: awake and alert Pain management: pain level controlled Vital Signs Assessment: post-procedure vital signs reviewed and stable Respiratory status: spontaneous breathing, nonlabored ventilation and respiratory function stable Cardiovascular status: blood pressure returned to baseline Postop Assessment: no apparent nausea or vomiting Anesthetic complications: no   No notable events documented.  Last Vitals:  Vitals:   04/03/22 1105 04/03/22 1130  BP: 118/72 132/73  Pulse: 85 83  Resp: 12 18  Temp: 36.6 C 36.7 C  SpO2: 100% 100%    Last Pain:  Vitals:   04/03/22 1050  TempSrc:   PainSc: Asleep                 Marthenia Rolling

## 2022-04-03 NOTE — Transfer of Care (Signed)
Immediate Anesthesia Transfer of Care Note  Patient: Cynthia Bray  Procedure(s) Performed: NECK EXPLORATION, LEFT SUPERIOR PARATHYROIDECTOMY, LEFT INFERIOR PARATHYROIDECTOMY, RIGHT SUPERIOR PARATHYROIDECTOMY (Neck)  Patient Location: PACU  Anesthesia Type:General  Level of Consciousness: awake, alert  and oriented  Airway & Oxygen Therapy: Patient Spontanous Breathing  Post-op Assessment: Report given to RN and Post -op Vital signs reviewed and stable  Post vital signs: Reviewed and stable  Last Vitals:  Vitals Value Taken Time  BP 139/80 04/03/22 0920  Temp    Pulse 97 04/03/22 0923  Resp 14 04/03/22 0923  SpO2 97 % 04/03/22 0923  Vitals shown include unvalidated device data.  Last Pain:  Vitals:   04/03/22 0609  TempSrc:   PainSc: 0-No pain         Complications: No notable events documented.

## 2022-04-03 NOTE — Anesthesia Procedure Notes (Signed)
Arterial Line Insertion Start/End6/22/2023 7:15 AM Performed by: Carolan Clines, CRNA, CRNA  Patient location: Pre-op. Preanesthetic checklist: patient identified, IV checked, site marked, risks and benefits discussed, surgical consent, monitors and equipment checked, pre-op evaluation, timeout performed and anesthesia consent Lidocaine 1% used for infiltration Left, radial was placed Catheter size: 20 G Hand hygiene performed  and maximum sterile barriers used   Attempts: 1 Procedure performed without using ultrasound guided technique. Following insertion, dressing applied and Biopatch. Post procedure assessment: normal and unchanged  Patient tolerated the procedure well with no immediate complications.

## 2022-04-03 NOTE — Interval H&P Note (Signed)
History and Physical Interval Note:  04/03/2022 7:02 AM  Cynthia Bray  has presented today for surgery, with the diagnosis of SECONDARY HYPERPARATHYROIDISM.  The various methods of treatment have been discussed with the patient and family. After consideration of risks, benefits and other options for treatment, the patient has consented to    Procedure(s): TOTAL PARATHYROIDECTOMY WITH AUTOTRANSPLANT TO LEFT FOREARM (N/A) as a surgical intervention.    The patient's history has been reviewed, patient examined, no change in status, stable for surgery.  I have reviewed the patient's chart and labs.  Questions were answered to the patient's satisfaction.    Armandina Gemma, [facility] [place] practice Office: [place]

## 2022-04-04 ENCOUNTER — Encounter (HOSPITAL_COMMUNITY): Payer: Self-pay | Admitting: Surgery

## 2022-04-04 LAB — BASIC METABOLIC PANEL
Anion gap: 16 — ABNORMAL HIGH (ref 5–15)
Anion gap: 15 (ref 5–15)
Anion gap: 14 (ref 5–15)
BUN: 38 mg/dL — ABNORMAL HIGH (ref 8–23)
BUN: 38 mg/dL — ABNORMAL HIGH (ref 8–23)
BUN: 11 mg/dL (ref 8–23)
CO2: 26 mmol/L (ref 22–32)
CO2: 23 mmol/L (ref 22–32)
CO2: 29 mmol/L (ref 22–32)
Calcium: 7.1 mg/dL — ABNORMAL LOW (ref 8.9–10.3)
Calcium: 10.7 mg/dL — ABNORMAL HIGH (ref 8.9–10.3)
Calcium: 7.8 mg/dL — ABNORMAL LOW (ref 8.9–10.3)
Chloride: 95 mmol/L — ABNORMAL LOW (ref 98–111)
Chloride: 99 mmol/L (ref 98–111)
Chloride: 94 mmol/L — ABNORMAL LOW (ref 98–111)
Creatinine, Ser: 8.14 mg/dL — ABNORMAL HIGH (ref 0.44–1.00)
Creatinine, Ser: 8.04 mg/dL — ABNORMAL HIGH (ref 0.44–1.00)
Creatinine, Ser: 3.15 mg/dL — ABNORMAL HIGH (ref 0.44–1.00)
GFR, Estimated: 5 mL/min — ABNORMAL LOW (ref >60)
GFR, Estimated: 5 mL/min — ABNORMAL LOW (ref >60)
GFR, Estimated: 16 mL/min — ABNORMAL LOW (ref >60)
Glucose, Bld: 92 mg/dL (ref 70–99)
Glucose, Bld: 89 mg/dL (ref 70–99)
Glucose, Bld: 85 mg/dL (ref 70–99)
Potassium: 4.7 mmol/L (ref 3.5–5.1)
Potassium: 4.5 mmol/L (ref 3.5–5.1)
Potassium: 3.5 mmol/L (ref 3.5–5.1)
Sodium: 137 mmol/L (ref 135–145)
Sodium: 137 mmol/L (ref 135–145)
Sodium: 137 mmol/L (ref 135–145)

## 2022-04-04 LAB — CALCIUM
Calcium: 6.9 mg/dL — ABNORMAL LOW (ref 8.9–10.3)
Calcium: 6.9 mg/dL — ABNORMAL LOW (ref 8.9–10.3)

## 2022-04-04 LAB — SURGICAL PATHOLOGY

## 2022-04-04 LAB — HEPATITIS B SURFACE ANTIBODY, QUANTITATIVE: Hep B S AB Quant (Post): 313.6 m[IU]/mL (ref >9.9)

## 2022-04-04 MED ORDER — LIDOCAINE-PRILOCAINE 2.5-2.5 % EX CREA: 1.0000 | TOPICAL_CREAM | CUTANEOUS | Status: DC | PRN

## 2022-04-04 MED ORDER — PENTAFLUOROPROP-TETRAFLUOROETH EX AERO: 1.0000 | INHALATION_SPRAY | CUTANEOUS | Status: DC | PRN

## 2022-04-04 MED ORDER — CALCIUM GLUCONATE-NACL 2-0.675 GM/100ML-% IV SOLN
2.0000 g | Freq: Once | INTRAVENOUS | Status: AC
Start: 2022-04-04 — End: 2022-04-04
  Administered 2022-04-04: 2000 mg via INTRAVENOUS
  Filled 2022-04-04: qty 100

## 2022-04-04 MED ORDER — CALCITRIOL 0.5 MCG PO CAPS
1.0000 ug | ORAL_CAPSULE | Freq: Every day | ORAL | Status: DC
  Administered 2022-04-05 – 2022-04-06 (×2): 1 ug via ORAL
  Filled 2022-04-04 (×2): qty 2

## 2022-04-04 MED ORDER — SODIUM CHLORIDE 0.9 % IV BOLUS
500.0000 mL | Freq: Once | INTRAVENOUS | Status: AC
  Administered 2022-04-04: 500 mL via INTRAVENOUS

## 2022-04-04 MED ORDER — LIDOCAINE HCL (PF) 1 % IJ SOLN: 5.0000 mL | INTRAMUSCULAR | Status: DC | PRN

## 2022-04-04 MED ORDER — CALCIUM GLUCONATE-NACL 2-0.675 GM/100ML-% IV SOLN
2.0000 g | Freq: Once | INTRAVENOUS | Status: AC
Start: 2022-04-04 — End: 2022-04-04
  Administered 2022-04-04: 2000 mg via INTRAVENOUS
  Filled 2022-04-04 (×2): qty 100

## 2022-04-05 ENCOUNTER — Encounter (HOSPITAL_COMMUNITY): Payer: Self-pay | Admitting: Surgery

## 2022-04-05 LAB — CBC
HCT: 29 % — ABNORMAL LOW (ref 36.0–46.0)
Hemoglobin: 9.2 g/dL — ABNORMAL LOW (ref 12.0–15.0)
MCH: 28.5 pg (ref 26.0–34.0)
MCHC: 31.7 g/dL (ref 30.0–36.0)
MCV: 89.8 fL (ref 80.0–100.0)
Platelets: 166 10*3/uL (ref 150–400)
RBC: 3.23 MIL/uL — ABNORMAL LOW (ref 3.87–5.11)
RDW: 16.6 % — ABNORMAL HIGH (ref 11.5–15.5)
WBC: 6.1 10*3/uL (ref 4.0–10.5)
nRBC: 0 % (ref 0.0–0.2)

## 2022-04-05 LAB — CALCIUM
Calcium: 7 mg/dL — ABNORMAL LOW (ref 8.9–10.3)
Calcium: 6.5 mg/dL — ABNORMAL LOW (ref 8.9–10.3)
Calcium: 6.5 mg/dL — ABNORMAL LOW (ref 8.9–10.3)
Calcium: 6.2 mg/dL — CL (ref 8.9–10.3)
Calcium: 7.3 mg/dL — ABNORMAL LOW (ref 8.9–10.3)
Calcium: 6.4 mg/dL — CL (ref 8.9–10.3)

## 2022-04-05 LAB — CALCIUM, IONIZED: Calcium, Ionized, Serum: 5.6 mg/dL (ref 4.5–5.6)

## 2022-04-05 MED ORDER — CALCIUM GLUCONATE-NACL 2-0.675 GM/100ML-% IV SOLN
2.0000 g | Freq: Once | INTRAVENOUS | Status: AC
  Administered 2022-04-05: 2000 mg via INTRAVENOUS
  Filled 2022-04-05: qty 100

## 2022-04-05 MED ORDER — SODIUM CHLORIDE 0.9 % IV SOLN
1.5000 mg/kg/h | INTRAVENOUS | Status: DC
Start: 2022-04-05 — End: 2022-04-07
  Administered 2022-04-05 – 2022-04-06 (×3): 1 mg/kg/h via INTRAVENOUS
  Administered 2022-04-07 (×2): 1.5 mg/kg/h via INTRAVENOUS
  Filled 2022-04-05 (×6): qty 100

## 2022-04-05 NOTE — Progress Notes (Signed)
    Assessment & Plan: POD#2 - status post neck exploration and resection of three parathyroid glands  HD today per nephrology  Calcium 6.5 this morning - management per nephrology. Receiving prn IV calcium gluconate.  Pain controlled  Tolerating diet - mild pain with swallowing  Patient is stable post op.  Home when stable from nephrology standpoint, do not anticipate discharge today as patient is still requiring significant calcium repletion.               Chief Complaint: Secondary hyperparathyroidism, ESRD  Subjective: No acute complaints, eating breakfast. Received IV calcium overnight. Endorses some sore throat, no hoarseness.  Objective: Vital signs in last 24 hours: Temp:  [97.8 F (36.6 C)-100.2 F (37.9 C)] 99.5 F (37.5 C) (06/24 0418) Pulse Rate:  [75-110] 105 (06/24 0418) Resp:  [10-18] 15 (06/24 0418) BP: (90-123)/(45-73) 114/67 (06/24 0418) SpO2:  [98 %-100 %] 98 % (06/24 0418) Weight:  [51 kg] 51 kg (06/23 1123)    Intake/Output from previous day: 06/23 0701 - 06/24 0700 In: 250 [P.O.:250] Out: -  Intake/Output this shift: No intake/output data recorded.  Physical Exam: HEENT - sclerae clear, mucous membranes moist Neck - incision clean and dry, small hematoma inferior to incision Ext - no edema, non-tender Neuro - alert & oriented, no focal deficits  Lab Results:  Recent Labs    04/05/22 0045  WBC 6.1  HGB 9.2*  HCT 29.0*  PLT 166   BMET Recent Labs    04/04/22 0625 04/04/22 1224 04/04/22 1639 04/05/22 0045 04/05/22 0444  NA 137 137  --   --   --   K 4.5 3.5  --   --   --   CL 99 94*  --   --   --   CO2 23 29  --   --   --   GLUCOSE 89 85  --   --   --   BUN 38* 11  --   --   --   CREATININE 8.04* 3.15*  --   --   --   CALCIUM 10.7* 7.8*   < > 7.0* 6.5*   < > = values in this interval not displayed.   PT/INR No results for input(s): "LABPROT", "INR" in the last 72 hours. Comprehensive Metabolic Panel:    Component Value  Date/Time   NA 137 04/04/2022 1224   NA 137 04/04/2022 0625   K 3.5 04/04/2022 1224   K 4.5 04/04/2022 0625   CL 94 (L) 04/04/2022 1224   CL 99 04/04/2022 0625   CO2 29 04/04/2022 1224   CO2 23 04/04/2022 0625   BUN 11 04/04/2022 1224   BUN 38 (H) 04/04/2022 0625   CREATININE 3.15 (H) 04/04/2022 1224   CREATININE 8.04 (H) 04/04/2022 0625   GLUCOSE 85 04/04/2022 1224   GLUCOSE 89 04/04/2022 0625   CALCIUM 6.5 (L) 04/05/2022 0444   CALCIUM 7.0 (L) 04/05/2022 0045   AST 19 06/17/2021 1905   ALT 7 06/17/2021 1905   ALKPHOS 227 (H) 06/17/2021 1905   BILITOT 1.2 06/17/2021 1905   PROT 7.3 06/17/2021 1905   ALBUMIN 3.3 (L) 04/03/2022 1546   ALBUMIN 3.2 (L) 06/17/2021 1905    Studies/Results: No results found.    Fritzi Mandes 04/05/2022   Patient ID: Cynthia Bray, female   DOB: [birth year], 61 y.o.   MRN: [id number]

## 2022-04-05 NOTE — Progress Notes (Addendum)
[facility] Progress Note   Subjective:   Patient seen and examined at bedside.  States she is feeling well this AM.  Denies CP, SOB, edema, orthopnea, weakness, dizziness and fatigue.   Tolerated part of her breakfast and currently eating ice.  Reminded of fluid restrictions.  Denies jaw stiffness, and tingling/numbness around mouth or in extremities.    Received 3 - 2 g IV calcium gluconate bolus in last 12hours with no improvement in Calcium.   Objective Vitals:   04/05/22 0008 04/05/22 0418 04/05/22 0844 04/05/22 0924  BP: (!) 98/52 114/67 (!) 93/57 100/64  Pulse: 99 (!) 105 98 95  Resp: 17 15 17    Temp: 99 F (37.2 C) 99.5 F (37.5 C) 98.9 F (37.2 C)   TempSrc: Oral Oral Oral   SpO2: 100% 98% 100%   Weight:      Height:       Physical Exam General: Frail, thin female in NAD Heart:RRR, no mrg Lungs:CTAB, nml WOB on RA Abdomen:soft, NTND Extremities:no LE edema Dialysis Access: RU AVG +b/t   Filed Weights   04/03/22 0612 04/04/22 0725 04/04/22 1123  Weight: 52.2 kg 51.4 kg 51 kg    Intake/Output Summary (Last 24 hours) at 04/05/2022 1302 Last data filed at 04/05/2022 0935 Gross per 24 hour  Intake --  Output 0 ml  Net 0 ml    Additional Objective Labs: Basic Metabolic Panel: Recent Labs  Lab 04/03/22 1546 04/03/22 1846 04/04/22 0259 04/04/22 0625 04/04/22 1224 04/04/22 1639 04/05/22 0045 04/05/22 0444 04/05/22 0913  NA 139  139   < > 137 137 137  --   --   --   --   K 4.4  4.4   < > 4.7 4.5 3.5  --   --   --   --   CL 95*  94*   < > 95* 99 94*  --   --   --   --   CO2 28  27   < > 26 23 29   --   --   --   --   GLUCOSE 129*  129*   < > 92 89 85  --   --   --   --   BUN 33*  32*   < > 38* 38* 11  --   --   --   --   CREATININE 7.33*  7.35*   < > 8.14* 8.04* 3.15*  --   --   --   --   CALCIUM 7.8*  7.7*   < > 7.1* 10.7* 7.8*   < > 7.0* 6.5* 6.5*  PHOS 5.4*  --   --   --   --   --   --   --   --    < > = values in this interval  not displayed.   Liver Function Tests: Recent Labs  Lab 04/03/22 1546  ALBUMIN 3.3*   CBC: Recent Labs  Lab 04/05/22 0045  WBC 6.1  HGB 9.2*  HCT 29.0*  MCV 89.8  PLT 166    Medications:  sodium chloride     calcium gluconate 930 mg of elemental calcium in sodium chloride 0.9 % 1,000 mL infusion      calcitRIOL  1 mcg Oral Daily   calcium carbonate  400 mg of elemental calcium Oral TID   Chlorhexidine Gluconate Cloth  6 each Topical Q0600   hydrALAZINE  25 mg Oral Q8H   isosorbide  mononitrate  30 mg Oral Daily   metoprolol succinate  100 mg Oral Daily    Dialysis Orders: [facility] MWF 3:45 hr 160NRe 350/500 52 kg 2.0 K/2.0 Ca AVG -Heparin 2000 units IV TIW -Parsabiv 15 mg IV TIW discontinue on discharge     Assessment/Plan:  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 Silvano Rusk. 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 All lobes sent for pathology.  Metabolic bone disease -  Calcium drip d/c yesterday due to Ca 10.7.  Calcium dropped post HD and continued to drop overnight despite 3 - 2g bolus Calcium gluconate.  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  Ca+ infusion restarted at 1mg /kg/hr. Check Ca q 4 hours. Check ionized Ca.  Calcitriol increased to qd. Add Tums 2 chews q 8 hours-do not give with meals.  Hold binders.   ESRD -  MWF next HD 04/07/2022. Hold heparin. 3.0 K bath.  Hypertension/volume  - BP well controlled. Home BP meds have been resumed. Does not appear volume overloaded on exam.  UF as tolerated.   Anemia  - Last HGB 10.6 04/02/2022. Not on ESA. Follow labs.   Nutrition - Last albumin 3.9 04/02/2022. H/O FFT. On megace.  Virgina Norfolk, PA-C [facility] 04/05/2022,1:02 PM  LOS: 2 days    Seen and examined independently.  Agree with note and exam as documented above by physician extender and as noted here.  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 (This is half dose of prior infusion) - Increased calcitriol  - Continue tums  - We are unfortunately currently limited to 2.5 calcium baths here  - Calcium checks every 4 hours   # Severe secondary hyperparathyroidism - s/p parathyroidectomy   # ESRD  - HD per MWF schedule   # anemia CKD  - at goal   Estanislado Emms, MD 04/05/2022  3:39 PM

## 2022-04-06 LAB — RENAL FUNCTION PANEL
Albumin: 3 g/dL — ABNORMAL LOW (ref 3.5–5.0)
Anion gap: 18 — ABNORMAL HIGH (ref 5–15)
BUN: 34 mg/dL — ABNORMAL HIGH (ref 8–23)
CO2: 19 mmol/L — ABNORMAL LOW (ref 22–32)
Calcium: 6.8 mg/dL — ABNORMAL LOW (ref 8.9–10.3)
Chloride: 97 mmol/L — ABNORMAL LOW (ref 98–111)
Creatinine, Ser: 7.96 mg/dL — ABNORMAL HIGH (ref 0.44–1.00)
GFR, Estimated: 5 mL/min — ABNORMAL LOW (ref >60)
Glucose, Bld: 99 mg/dL (ref 70–99)
Phosphorus: 2.6 mg/dL (ref 2.5–4.6)
Potassium: 4.9 mmol/L (ref 3.5–5.1)
Sodium: 134 mmol/L — ABNORMAL LOW (ref 135–145)

## 2022-04-06 LAB — CALCIUM
Calcium: 6.7 mg/dL — ABNORMAL LOW (ref 8.9–10.3)
Calcium: 6.8 mg/dL — ABNORMAL LOW (ref 8.9–10.3)
Calcium: 6.8 mg/dL — ABNORMAL LOW (ref 8.9–10.3)

## 2022-04-06 LAB — IRON AND TIBC
Iron: 77 ug/dL (ref 28–170)
Saturation Ratios: 40 % — ABNORMAL HIGH (ref 10.4–31.8)
TIBC: 192 ug/dL — ABNORMAL LOW (ref 250–450)
UIBC: 115 ug/dL

## 2022-04-06 MED ORDER — CHLORHEXIDINE GLUCONATE CLOTH 2 % EX PADS
6.0000 | MEDICATED_PAD | Freq: Every day | CUTANEOUS | Status: DC
  Administered 2022-04-07: 6 via TOPICAL

## 2022-04-06 MED ORDER — CALCITRIOL 0.5 MCG PO CAPS
1.5000 ug | ORAL_CAPSULE | Freq: Every day | ORAL | Status: DC
  Administered 2022-04-07 – 2022-04-10 (×4): 1.5 ug via ORAL
  Filled 2022-04-06 (×5): qty 3

## 2022-04-06 MED ORDER — CALCITRIOL 0.5 MCG PO CAPS
0.5000 ug | ORAL_CAPSULE | Freq: Every day | ORAL | Status: DC
  Administered 2022-04-06 – 2022-04-07 (×2): 0.5 ug via ORAL
  Filled 2022-04-06 (×3): qty 1

## 2022-04-07 LAB — CBC
HCT: 28 % — ABNORMAL LOW (ref 36.0–46.0)
HCT: 28.4 % — ABNORMAL LOW (ref 36.0–46.0)
Hemoglobin: 8.7 g/dL — ABNORMAL LOW (ref 12.0–15.0)
Hemoglobin: 9 g/dL — ABNORMAL LOW (ref 12.0–15.0)
MCH: 27.7 pg (ref 26.0–34.0)
MCH: 28.1 pg (ref 26.0–34.0)
MCHC: 31.1 g/dL (ref 30.0–36.0)
MCHC: 31.7 g/dL (ref 30.0–36.0)
MCV: 89.2 fL (ref 80.0–100.0)
MCV: 88.8 fL (ref 80.0–100.0)
Platelets: 177 10*3/uL (ref 150–400)
Platelets: 177 10*3/uL (ref 150–400)
RBC: 3.14 MIL/uL — ABNORMAL LOW (ref 3.87–5.11)
RBC: 3.2 MIL/uL — ABNORMAL LOW (ref 3.87–5.11)
RDW: 16 % — ABNORMAL HIGH (ref 11.5–15.5)
RDW: 16.1 % — ABNORMAL HIGH (ref 11.5–15.5)
WBC: 4.9 10*3/uL (ref 4.0–10.5)
WBC: 5 10*3/uL (ref 4.0–10.5)
nRBC: 0 % (ref 0.0–0.2)
nRBC: 0 % (ref 0.0–0.2)

## 2022-04-07 LAB — RENAL FUNCTION PANEL
Albumin: 2.6 g/dL — ABNORMAL LOW (ref 3.5–5.0)
Albumin: 2.8 g/dL — ABNORMAL LOW (ref 3.5–5.0)
Anion gap: 18 — ABNORMAL HIGH (ref 5–15)
Anion gap: 15 (ref 5–15)
BUN: 42 mg/dL — ABNORMAL HIGH (ref 8–23)
BUN: 40 mg/dL — ABNORMAL HIGH (ref 8–23)
CO2: 20 mmol/L — ABNORMAL LOW (ref 22–32)
CO2: 21 mmol/L — ABNORMAL LOW (ref 22–32)
Calcium: 7.9 mg/dL — ABNORMAL LOW (ref 8.9–10.3)
Calcium: 8 mg/dL — ABNORMAL LOW (ref 8.9–10.3)
Chloride: 97 mmol/L — ABNORMAL LOW (ref 98–111)
Chloride: 99 mmol/L (ref 98–111)
Creatinine, Ser: 9.16 mg/dL — ABNORMAL HIGH (ref 0.44–1.00)
Creatinine, Ser: 9.52 mg/dL — ABNORMAL HIGH (ref 0.44–1.00)
GFR, Estimated: 4 mL/min — ABNORMAL LOW (ref >60)
GFR, Estimated: 4 mL/min — ABNORMAL LOW (ref >60)
Glucose, Bld: 90 mg/dL (ref 70–99)
Glucose, Bld: 111 mg/dL — ABNORMAL HIGH (ref 70–99)
Phosphorus: 2.1 mg/dL — ABNORMAL LOW (ref 2.5–4.6)
Phosphorus: 2.1 mg/dL — ABNORMAL LOW (ref 2.5–4.6)
Potassium: 5 mmol/L (ref 3.5–5.1)
Potassium: 5 mmol/L (ref 3.5–5.1)
Sodium: 135 mmol/L (ref 135–145)
Sodium: 135 mmol/L (ref 135–145)

## 2022-04-07 LAB — CALCIUM
Calcium: 8 mg/dL — ABNORMAL LOW (ref 8.9–10.3)
Calcium: 7.2 mg/dL — ABNORMAL LOW (ref 8.9–10.3)
Calcium: 6.9 mg/dL — ABNORMAL LOW (ref 8.9–10.3)

## 2022-04-07 MED ORDER — CALCIUM CARBONATE ANTACID 500 MG PO CHEW
800.0000 mg | CHEWABLE_TABLET | Freq: Three times a day (TID) | ORAL | Status: DC
  Administered 2022-04-07: 800 mg via ORAL
  Filled 2022-04-07 (×2): qty 4

## 2022-04-07 MED ORDER — LIDOCAINE-PRILOCAINE 2.5-2.5 % EX CREA: 1.0000 | TOPICAL_CREAM | CUTANEOUS | Status: DC | PRN

## 2022-04-07 MED ORDER — POLYETHYLENE GLYCOL 3350 17 G PO PACK
17.0000 g | PACK | Freq: Every day | ORAL | Status: DC
  Administered 2022-04-07 – 2022-04-08 (×2): 17 g via ORAL
  Filled 2022-04-07 (×2): qty 1

## 2022-04-07 MED ORDER — ALTEPLASE 2 MG IJ SOLR: 2.0000 mg | Freq: Once | INTRAMUSCULAR | Status: DC | PRN

## 2022-04-07 MED ORDER — PENTAFLUOROPROP-TETRAFLUOROETH EX AERO: 1.0000 | INHALATION_SPRAY | CUTANEOUS | Status: DC | PRN

## 2022-04-07 MED ORDER — ANTICOAGULANT SODIUM CITRATE 4% (200MG/5ML) IV SOLN
5.0000 mL | Status: DC | PRN
Start: 2022-04-07 — End: 2022-04-07

## 2022-04-07 MED ORDER — LIDOCAINE HCL (PF) 1 % IJ SOLN: 5.0000 mL | INTRAMUSCULAR | Status: DC | PRN

## 2022-04-07 MED ORDER — HEPARIN SODIUM (PORCINE) 1000 UNIT/ML DIALYSIS
1000.0000 [IU] | INTRAMUSCULAR | Status: DC | PRN
Start: 2022-04-07 — End: 2022-04-07

## 2022-04-07 NOTE — Progress Notes (Addendum)
[facility] Progress Note   Subjective: seen in room, up in chair. C/O constipation. HD later today.   Objective Vitals:   04/06/22 1833 04/06/22 2120 04/07/22 0600 04/07/22 0733  BP: 130/77 122/68 119/75 (!) 148/76  Pulse: 78 84 85 87  Resp: 17 16 18 16   Temp: 98.2 F (36.8 C) 98.7 F (37.1 C) 98.5 F (36.9 C) 98.5 F (36.9 C)  TempSrc: Oral Oral Oral Oral  SpO2: 100% 100% 99% 100%  Weight:      Height:       Physical Exam General: Chronically ill appearing female in NAD Heart: S1,S2 2/6 systolic M. No R/G Lungs:CTAB Abdomen: NABS, NT Extremities:No LE edema Dialysis Access: R AVG + T/B   Additional Objective Labs: Basic Metabolic Panel: Recent Labs  Lab 04/03/22 1546 04/03/22 1846 04/04/22 1224 04/04/22 1639 04/06/22 1253 04/06/22 1934 04/07/22 0612 04/07/22 0931  NA 139  139   < > 137  --  134*  --  135  --   K 4.4  4.4   < > 3.5  --  4.9  --  5.0  --   CL 95*  94*   < > 94*  --  97*  --  97*  --   CO2 28  27   < > 29  --  19*  --  20*  --   GLUCOSE 129*  129*   < > 85  --  99  --  90  --   BUN 33*  32*   < > 11  --  34*  --  42*  --   CREATININE 7.33*  7.35*   < > 3.15*  --  7.96*  --  9.16*  --   CALCIUM 7.8*  7.7*   < > 7.8*   < > 6.8* 6.8* 7.9* 8.0*  PHOS 5.4*  --   --   --  2.6  --  2.1*  --    < > = values in this interval not displayed.   Liver Function Tests: Recent Labs  Lab 04/03/22 1546 04/06/22 1253 04/07/22 0612  ALBUMIN 3.3* 3.0* 2.6*   No results for input(s): "LIPASE", "AMYLASE" in the last 168 hours. CBC: Recent Labs  Lab 04/05/22 0045 04/07/22 0612  WBC 6.1 4.9  HGB 9.2* 8.7*  HCT 29.0* 28.0*  MCV 89.8 89.2  PLT 166 177   Blood Culture    Component Value Date/Time   SDES BLOOD LEFT ANTECUBITAL 05/08/2021 0831   SPECREQUEST  05/08/2021 0831    BOTTLES DRAWN AEROBIC AND ANAEROBIC Blood Culture results may not be optimal due to an inadequate volume of blood received in culture bottles   CULT   05/08/2021 0831    NO GROWTH 5 DAYS Performed at [facility], [address], [place], [place] [address]    REPTSTATUS 05/13/2021 FINAL 05/08/2021 0831    Cardiac Enzymes: No results for input(s): "CKTOTAL", "CKMB", "CKMBINDEX", "TROPONINI" in the last 168 hours. CBG: No results for input(s): "GLUCAP" in the last 168 hours. Iron Studies:  Recent Labs    04/06/22 1253  IRON 77  TIBC 192*   @lablastinr3 @ Studies/Results: No results found. Medications:  sodium chloride 56.1 mL/hr at 04/06/22 0600   calcium gluconate 930 mg of elemental calcium in sodium chloride 0.9 % 1,000 mL infusion 1.5 mg/kg/hr of elemental calcium (04/07/22 0950)    calcitRIOL  0.5 mcg Oral Daily   calcitRIOL  1.5 mcg Oral Daily  calcium carbonate  400 mg of elemental calcium Oral TID   hydrALAZINE  25 mg Oral Q8H   isosorbide mononitrate  30 mg Oral Daily   metoprolol succinate  100 mg Oral Daily     Dialysis Orders: [facility] MWF 3:45 hr 160NRe 350/500 52 kg 2.0 K/2.0 Ca AVG -Heparin 2000 units IV TIW -Parsabiv 15 mg IV TIW discontinue on discharge      Assessment/Plan:  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 Silvano Rusk. 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 All lobes sent for pathology.  Hypocalcemia -  Hungry bone syndrome s/p parathyroidectomy. Calcium drip d/c 6/23 due to Ca 10.7 and restarted on 6/24 due to dropping Ca despite 3 - 2g bolus Calcium gluconate and increased calcitriol.  Infusion restarted at 1mg /kg/hr.  Ca slowly trending up but remains <7.  Infusion rate increased to 1.5mg /kg/hr, calcitriol increased to 1.5mg  qd.  ContinueTums 2 chews q 8 hours-do not give with meals. Unfortunately only have 2.5Ca bath for HD. Check Ca q 4 hours.  Metabolic bone disease - hypocalcemia as noted above.  Phos in goal. Holding binders. D/c outpatient parsabiv on discharge.   ESRD -  MWF next HD 04/07/2022. Hold heparin. K+ 5.0. Changed to 2.0 K bath.   Hypertension/volume  - BP well  controlled. Home BP meds have been resumed. Does not appear volume overloaded on exam.  UF as tolerated.   Anemia  - Last HGB 9.2 04/05/2022. Check iron studies. Follow labs.   Nutrition - Last albumin 3.9 04/02/2022. H/O FFT. On megace. Constipation-daily miralax.   Jarrett Chicoine H. Ryder Chesmore NP-C 04/07/2022, 10:52 AM  [employer] [phone]

## 2022-04-07 NOTE — Progress Notes (Signed)
              Chief Complaint: Secondary hyperparathyroidism, ESRD  Subjective: No acute complaints. No dysphagia. Calcium up to 7.9 this morning, on calcium gtt.  Objective: Vital signs in last 24 hours: Temp:  [98.2 F (36.8 C)-98.7 F (37.1 C)] 98.5 F (36.9 C) (06/26 0733) Pulse Rate:  [78-88] 87 (06/26 0733) Resp:  [16-18] 16 (06/26 0733) BP: (119-148)/(68-81) 148/76 (06/26 0733) SpO2:  [99 %-100 %] 100 % (06/26 0733) Last BM Date : 04/02/22  Intake/Output from previous day: No intake/output data recorded. Intake/Output this shift: No intake/output data recorded.  Physical Exam: HEENT - sclerae clear, mucous membranes moist Neck - incision clean and dry, small hematoma, soft and unchanged compared to prior Ext - no edema, non-tender Neuro - alert & oriented, no focal deficits  Lab Results:  Recent Labs    04/05/22 0045 04/07/22 0612  WBC 6.1 4.9  HGB 9.2* 8.7*  HCT 29.0* 28.0*  PLT 166 177   BMET Recent Labs    04/06/22 1253 04/06/22 1934 04/07/22 0612  NA 134*  --  135  K 4.9  --  5.0  CL 97*  --  97*  CO2 19*  --  20*  GLUCOSE 99  --  90  BUN 34*  --  42*  CREATININE 7.96*  --  9.16*  CALCIUM 6.8* 6.8* 7.9*   PT/INR No results for input(s): "LABPROT", "INR" in the last 72 hours. Comprehensive Metabolic Panel:    Component Value Date/Time   NA 135 04/07/2022 0612   NA 134 (L) 04/06/2022 1253   K 5.0 04/07/2022 0612   K 4.9 04/06/2022 1253   CL 97 (L) 04/07/2022 0612   CL 97 (L) 04/06/2022 1253   CO2 20 (L) 04/07/2022 0612   CO2 19 (L) 04/06/2022 1253   BUN 42 (H) 04/07/2022 0612   BUN 34 (H) 04/06/2022 1253   CREATININE 9.16 (H) 04/07/2022 0612   CREATININE 7.96 (H) 04/06/2022 1253   GLUCOSE 90 04/07/2022 0612   GLUCOSE 99 04/06/2022 1253   CALCIUM 7.9 (L) 04/07/2022 0612   CALCIUM 6.8 (L) 04/06/2022 1934   AST 19 06/17/2021 1905   ALT 7 06/17/2021 1905   ALKPHOS 227 (H) 06/17/2021 1905   BILITOT 1.2 06/17/2021 1905   PROT 7.3  06/17/2021 1905   ALBUMIN 2.6 (L) 04/07/2022 0612   ALBUMIN 3.0 (L) 04/06/2022 1253    Studies/Results: No results found.  Assessment & Plan: POD#4 - status post neck exploration and resection of three parathyroid glands  HD MWF per nephrology  Remains hypocalcemic, on calcium gtt and oral supplements per nephrology.  Pain controlled  Tolerating diet, no dysphagia  Discharge home when off IV calcium and deemed stable by nephrology.       Fritzi Mandes 04/07/2022

## 2022-04-07 NOTE — Plan of Care (Signed)
  Problem: Education: Goal: Knowledge of General Education information will improve Description: Including pain rating scale, medication(s)/side effects and non-pharmacologic comfort measures Outcome: Progressing   Problem: Health Behavior/Discharge Planning: Goal: Ability to manage health-related needs will improve Outcome: Progressing   Problem: Clinical Measurements: Goal: Ability to maintain clinical measurements within normal limits will improve Outcome: Progressing Goal: Will remain free from infection Outcome: Progressing Goal: Diagnostic test results will improve Outcome: Progressing   Problem: Nutrition: Goal: Adequate nutrition will be maintained Outcome: Progressing   Problem: Elimination: Goal: Will not experience complications related to bowel motility Outcome: Progressing   Problem: Pain Managment: Goal: General experience of comfort will improve Outcome: Progressing   Problem: Safety: Goal: Ability to remain free from injury will improve Outcome: Progressing   Problem: Skin Integrity: Goal: Risk for impaired skin integrity will decrease Outcome: Progressing   

## 2022-04-08 LAB — PHOSPHORUS: Phosphorus: 2 mg/dL — ABNORMAL LOW (ref 2.5–4.6)

## 2022-04-08 LAB — MAGNESIUM: Magnesium: 1.6 mg/dL — ABNORMAL LOW (ref 1.7–2.4)

## 2022-04-08 LAB — CALCIUM
Calcium: 6.6 mg/dL — ABNORMAL LOW (ref 8.9–10.3)
Calcium: 6.1 mg/dL — CL (ref 8.9–10.3)
Calcium: 6.1 mg/dL — CL (ref 8.9–10.3)
Calcium: 6.8 mg/dL — ABNORMAL LOW (ref 8.9–10.3)
Calcium: 7.3 mg/dL — ABNORMAL LOW (ref 8.9–10.3)
Calcium: 6.7 mg/dL — ABNORMAL LOW (ref 8.9–10.3)

## 2022-04-08 MED ORDER — SODIUM CHLORIDE 0.9 % IV SOLN
1.5000 mg/kg/h | INTRAVENOUS | Status: DC
  Filled 2022-04-08 (×2): qty 100

## 2022-04-08 MED ORDER — K PHOS MONO-SOD PHOS DI & MONO 155-852-130 MG PO TABS
500.0000 mg | ORAL_TABLET | Freq: Once | ORAL | Status: AC
  Administered 2022-04-08: 500 mg via ORAL
  Filled 2022-04-08: qty 2

## 2022-04-08 MED ORDER — CALCIUM CARBONATE ANTACID 500 MG PO CHEW
1200.0000 mg | CHEWABLE_TABLET | Freq: Three times a day (TID) | ORAL | Status: DC
  Administered 2022-04-08 – 2022-04-10 (×4): 1200 mg via ORAL
  Filled 2022-04-08 (×4): qty 6

## 2022-04-08 MED ORDER — CHLORHEXIDINE GLUCONATE CLOTH 2 % EX PADS
6.0000 | MEDICATED_PAD | Freq: Every day | CUTANEOUS | Status: DC
  Administered 2022-04-10 – 2022-04-15 (×6): 6 via TOPICAL

## 2022-04-08 MED ORDER — MEGESTROL ACETATE 400 MG/10ML PO SUSP
400.0000 mg | Freq: Every day | ORAL | Status: DC
  Administered 2022-04-08 – 2022-04-15 (×8): 400 mg via ORAL
  Filled 2022-04-08 (×9): qty 10

## 2022-04-08 MED ORDER — POLYETHYLENE GLYCOL 3350 17 G PO PACK
17.0000 g | PACK | Freq: Two times a day (BID) | ORAL | Status: DC
  Administered 2022-04-08 – 2022-04-13 (×7): 17 g via ORAL
  Filled 2022-04-08 (×10): qty 1

## 2022-04-08 MED ORDER — MAGNESIUM SULFATE 2 GM/50ML IV SOLN
2.0000 g | Freq: Once | INTRAVENOUS | Status: AC
  Administered 2022-04-08: 2 g via INTRAVENOUS
  Filled 2022-04-08: qty 50

## 2022-04-08 MED ORDER — SENNOSIDES-DOCUSATE SODIUM 8.6-50 MG PO TABS
1.0000 | ORAL_TABLET | Freq: Two times a day (BID) | ORAL | Status: DC
  Administered 2022-04-08 – 2022-04-15 (×11): 1 via ORAL
  Filled 2022-04-08 (×12): qty 1

## 2022-04-08 MED ORDER — SODIUM CHLORIDE 0.9 % IV SOLN
1.5000 mg/kg/h | INTRAVENOUS | Status: DC
  Administered 2022-04-08 – 2022-04-09 (×2): 1.5 mg/kg/h via INTRAVENOUS
  Filled 2022-04-08 (×3): qty 100

## 2022-04-08 NOTE — Progress Notes (Signed)
 [facility] Progress Note   Subjective: Seen in room. No UF with HD due to error but says she feels fine. Doesn't want to go to HD again today. PO4/magnesius/corrected calcium  all low again. Will supplement with assist from pharmacy.   Objective Vitals:   04/07/22 2120 04/07/22 2130 04/08/22 0501 04/08/22 0739  BP: 118/67 122/73 (!) 99/59 129/72  Pulse: 92  84 86  Resp: 18  17 17   Temp: 99 F (37.2 C)  99.1 F (37.3 C) 98.7 F (37.1 C)  TempSrc: Oral  Oral Oral  SpO2: 99%  100% 99%  Weight:      Height:       Physical Exam General: Chronically ill appearing female in NAD Heart: S1,S2 2/6 systolic M. No R/G Lungs:CTAB Abdomen: NABS, NT Extremities:No LE edema Dialysis Access: R AVG + T/B   Additional Objective Labs: Basic Metabolic Panel: Recent Labs  Lab 04/06/22 1253 04/06/22 1934 04/07/22 0612 04/07/22 0931 04/07/22 1332 04/07/22 1943 04/07/22 2127 04/08/22 0137 04/08/22 0528  NA 134*  --  135  --  135  --   --   --   --   K 4.9  --  5.0  --  5.0  --   --   --   --   CL 97*  --  97*  --  99  --   --   --   --   CO2 19*  --  20*  --  21*  --   --   --   --   GLUCOSE 99  --  90  --  111*  --   --   --   --   BUN 34*  --  42*  --  40*  --   --   --   --   CREATININE 7.96*  --  9.16*  --  9.52*  --   --   --   --   CALCIUM  6.8*   < > 7.9*   < > 8.0*   < > 6.9* 6.6* 6.1*  PHOS 2.6  --  2.1*  --  2.1*  --   --   --   --    < > = values in this interval not displayed.   Liver Function Tests: Recent Labs  Lab 04/06/22 1253 04/07/22 0612 04/07/22 1332  ALBUMIN 3.0* 2.6* 2.8*   No results for input(s): LIPASE, AMYLASE in the last 168 hours. CBC: Recent Labs  Lab 04/05/22 0045 04/07/22 0612 04/07/22 1332  WBC 6.1 4.9 5.0  HGB 9.2* 8.7* 9.0*  HCT 29.0* 28.0* 28.4*  MCV 89.8 89.2 88.8  PLT 166 177 177   Blood Culture    Component Value Date/Time   SDES BLOOD LEFT ANTECUBITAL 05/08/2021 0831   SPECREQUEST  05/08/2021 0831     BOTTLES DRAWN AEROBIC AND ANAEROBIC Blood Culture results may not be optimal due to an inadequate volume of blood received in culture bottles   CULT  05/08/2021 0831    NO GROWTH 5 DAYS Performed at [facility], [address], [place], [place] [address]    REPTSTATUS 05/13/2021 FINAL 05/08/2021 0831    Cardiac Enzymes: No results for input(s): CKTOTAL, CKMB, CKMBINDEX, TROPONINI in the last 168 hours. CBG: No results for input(s): GLUCAP in the last 168 hours. Iron Studies:  Recent Labs    04/06/22 1253  IRON 77  TIBC 192*   @lablastinr3 @ Studies/Results: No results found. 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 Drema. 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 All lobes sent for pathology.  Hypocalcemia -  Hungry bone syndrome s/p parathyroidectomy. Corrected Ca+ 7.0 this AM, magnesium  1.6 PO4 2.0. Resumed Infusion rate increased to 1.5mg /kg/hr, calcitriol  increased to 1.5mg  qd.  ContinueTums 1 chews 1200 elemental calcium  q 8 hours-do not give with meals. Reminded pt to chew all tums. Unfortunately only have 2.5 Ca bath for HD. Check Ca q 4 hours.  Metabolic bone disease - hypocalcemia as noted above.  Phos in goal. Holding binders. D/c outpatient parsabiv on discharge.   ESRD -  MWF next HD 04/09/2022. Hold heparin . K+ 5.0. Changed to 2.0 K bath.   Hypertension/volume  - BP well  controlled. Home BP meds have been resumed. Does not appear volume overloaded on exam.  NOTE: Machine error with HD yesterday. No UF removed. UF as tolerated.   Anemia  - Last HGB 9.2 04/05/2022. Check iron studies. Follow labs.   Nutrition -NOT EATING! Last albumin 3.9 04/02/2022. H/O FFT. Prescribed Megace  but wasn't on OP med list. Will resume.  Constipation-aggressive bowel program now. Sennakot A and miralax  BID  Sahan Pen H. Darnell Jeschke NP-C 04/08/2022, 10:36 AM  [employer] [phone]

## 2022-04-08 NOTE — Care Management Important Message (Signed)
Important Message  Patient Details  Name: Cynthia Bray MRN: [id number] Date of Birth: Jan 26, 1961   Medicare Important Message Given:  Yes     Dorena Bodo 04/08/2022, 4:05 PM

## 2022-04-09 ENCOUNTER — Inpatient Hospital Stay (HOSPITAL_COMMUNITY): Payer: Medicare Other

## 2022-04-09 LAB — RENAL FUNCTION PANEL
Albumin: 2.6 g/dL — ABNORMAL LOW (ref 3.5–5.0)
Anion gap: 15 (ref 5–15)
BUN: 22 mg/dL (ref 8–23)
CO2: 22 mmol/L (ref 22–32)
Calcium: 7.9 mg/dL — ABNORMAL LOW (ref 8.9–10.3)
Chloride: 99 mmol/L (ref 98–111)
Creatinine, Ser: 7.16 mg/dL — ABNORMAL HIGH (ref 0.44–1.00)
GFR, Estimated: 6 mL/min — ABNORMAL LOW (ref >60)
Glucose, Bld: 104 mg/dL — ABNORMAL HIGH (ref 70–99)
Phosphorus: 1.9 mg/dL — ABNORMAL LOW (ref 2.5–4.6)
Potassium: 4.2 mmol/L (ref 3.5–5.1)
Sodium: 136 mmol/L (ref 135–145)

## 2022-04-09 LAB — CALCIUM
Calcium: 7.2 mg/dL — ABNORMAL LOW (ref 8.9–10.3)
Calcium: 7.4 mg/dL — ABNORMAL LOW (ref 8.9–10.3)
Calcium: 9.2 mg/dL (ref 8.9–10.3)
Calcium: 7 mg/dL — ABNORMAL LOW (ref 8.9–10.3)

## 2022-04-09 LAB — MAGNESIUM: Magnesium: 1.9 mg/dL (ref 1.7–2.4)

## 2022-04-09 LAB — PHOSPHORUS: Phosphorus: 2 mg/dL — ABNORMAL LOW (ref 2.5–4.6)

## 2022-04-09 LAB — CALCIUM, IONIZED: Calcium, Ionized, Serum: 3.2 mg/dL — ABNORMAL LOW (ref 4.5–5.6)

## 2022-04-09 MED ORDER — ALTEPLASE 2 MG IJ SOLR: 2.0000 mg | Freq: Once | INTRAMUSCULAR | Status: DC | PRN

## 2022-04-09 MED ORDER — LIDOCAINE-PRILOCAINE 2.5-2.5 % EX CREA: 1.0000 | TOPICAL_CREAM | CUTANEOUS | Status: DC | PRN

## 2022-04-09 MED ORDER — LIDOCAINE HCL (PF) 1 % IJ SOLN: 5.0000 mL | INTRAMUSCULAR | Status: DC | PRN

## 2022-04-09 MED ORDER — HEPARIN SODIUM (PORCINE) 1000 UNIT/ML DIALYSIS
2000.0000 [IU] | Freq: Once | INTRAMUSCULAR | Status: DC
  Administered 2022-04-09: 2000 [IU] via INTRAVENOUS_CENTRAL

## 2022-04-09 MED ORDER — HEPARIN SODIUM (PORCINE) 1000 UNIT/ML IJ SOLN
INTRAMUSCULAR | Status: AC
  Filled 2022-04-09: qty 2

## 2022-04-09 MED ORDER — CALCIUM GLUCONATE 10 % IV SOLN
1.5000 mg/kg/h | INTRAVENOUS | Status: AC
  Administered 2022-04-09: 1.5 mg/kg/h via INTRAVENOUS
  Filled 2022-04-09: qty 100

## 2022-04-09 MED ORDER — PENTAFLUOROPROP-TETRAFLUOROETH EX AERO: 1.0000 | INHALATION_SPRAY | CUTANEOUS | Status: DC | PRN

## 2022-04-09 NOTE — Progress Notes (Signed)
Received call from Lab dept and spoke with Otila Kluver, informed that Labs were not completed as ordered.  Informed Tina/lab personal to make sure staff comes to Pts' room at 6 PM to draw lab as ordered and will contact NP to inform them of same and request direction. Received call from Juanell Fairly, NP, informed to go ahead and get ordered labs at 6 PM, then get complete Renal Panel in the AM.  Returned call to Lab, spoke with Otila Kluver and informed her of same message from NP and she verbalized understanding.

## 2022-04-09 NOTE — Progress Notes (Signed)
[facility] Progress Note   Subjective: Seen prior to HD. Getting standing weight. Pre wt 58.3 kg! May need extra treatment. Says she is eating, feels OK.   Objective Vitals:   04/08/22 1625 04/08/22 2133 04/09/22 0559 04/09/22 0750  BP: 114/70 (!) 163/80 132/63 137/61  Pulse: 79 78 84 81  Resp: '18 17 17 16  '$ Temp: 98.9 F (37.2 C) 98.3 F (36.8 C) 98.6 F (37 C) 98.4 F (36.9 C)  TempSrc: Oral Oral Oral Oral  SpO2: 100% 100% 100% 100%  Weight:      Height:       Physical Exam General: Chronically ill appearing female in NAD Heart: P8,E4 2/6 systolic M. No R/G Lungs:CTAB Abdomen: NABS, NT Extremities:No LE edema Dialysis Access: R AVG + T/B    Additional Objective Labs: Basic Metabolic Panel: Recent Labs  Lab 04/06/22 1253 04/06/22 1934 04/07/22 0612 04/07/22 0931 04/07/22 1332 04/07/22 1943 04/08/22 0950 04/08/22 1351 04/08/22 2054 04/09/22 0116 04/09/22 0616  NA 134*  --  135  --  135  --   --   --   --   --   --   K 4.9  --  5.0  --  5.0  --   --   --   --   --   --   CL 97*  --  97*  --  99  --   --   --   --   --   --   CO2 19*  --  20*  --  21*  --   --   --   --   --   --   GLUCOSE 99  --  90  --  111*  --   --   --   --   --   --   BUN 34*  --  42*  --  40*  --   --   --   --   --   --   CREATININE 7.96*  --  9.16*  --  9.52*  --   --   --   --   --   --   CALCIUM 6.8*   < > 7.9*   < > 8.0*   < > 6.1*   < > 6.7* 7.2* 7.4*  PHOS 2.6  --  2.1*  --  2.1*  --  2.0*  --   --   --  2.0*   < > = values in this interval not displayed.   Liver Function Tests: Recent Labs  Lab 04/06/22 1253 04/07/22 0612 04/07/22 1332  ALBUMIN 3.0* 2.6* 2.8*   No results for input(s): "LIPASE", "AMYLASE" in the last 168 hours. CBC: Recent Labs  Lab 04/05/22 0045 04/07/22 0612 04/07/22 1332  WBC 6.1 4.9 5.0  HGB 9.2* 8.7* 9.0*  HCT 29.0* 28.0* 28.4*  MCV 89.8 89.2 88.8  PLT 166 177 177   Blood Culture    Component Value Date/Time   SDES  BLOOD LEFT ANTECUBITAL 05/08/2021 0831   SPECREQUEST  05/08/2021 0831    BOTTLES DRAWN AEROBIC AND ANAEROBIC Blood Culture results may not be optimal due to an inadequate volume of blood received in culture bottles   CULT  05/08/2021 0831    NO GROWTH 5 DAYS Performed at [facility] [address] [place], [place] [address]    REPTSTATUS 05/13/2021 FINAL 05/08/2021 0831    Cardiac Enzymes: No results for input(s): "CKTOTAL", "CKMB", "CKMBINDEX", "  TROPONINI" in the last 168 hours. CBG: No results for input(s): "GLUCAP" in the last 168 hours. Iron Studies:  Recent Labs    04/06/22 1253  IRON 77  TIBC 192*   '@lablastinr3'$ @ Studies/Results: No results found. Medications:  sodium chloride 56.1 mL/hr at 04/06/22 0600   calcium gluconate 930 mg of elemental calcium in sodium chloride 0.9 % 1,000 mL infusion      calcitRIOL  1.5 mcg Oral Daily   calcium carbonate  1,200 mg of elemental calcium Oral TID   Chlorhexidine Gluconate Cloth  6 each Topical Q0600   heparin  2,000 Units Dialysis Once in dialysis   hydrALAZINE  25 mg Oral Q8H   isosorbide mononitrate  30 mg Oral Daily   megestrol  400 mg Oral Daily   metoprolol succinate  100 mg Oral Daily   polyethylene glycol  17 g Oral BID   senna-docusate  1 tablet Oral BID     Dialysis Orders: [facility] MWF 3:45 hr 160NRe 350/500 52 kg 2.0 K/2.0 Ca AVG -Heparin 2000 units IV TIW -Parsabiv 15 mg IV TIW discontinue on discharge      Assessment/Plan:  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 Catalina Antigua. 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 All lobes sent for pathology.  Hypocalcemia -  Hungry bone syndrome s/p parathyroidectomy.  Ca+ 7.4 this AM, magnesium 1.9 PO4 2.0. Resumed Infusion rate increased to 1.'5mg'$ /kg/hr, calcitriol increased to 1.'5mg'$  qd.  ContinueTums 1 chews 1200 elemental calcium q 8 hours-do not give with meals. Reminded pt to chew all tums. Unfortunately only have 2.5 Ca bath for HD. Check Ca q 4 hours.   Metabolic bone disease - hypocalcemia as noted above.  Phos in goal. Holding binders. D/c outpatient parsabiv on discharge.   ESRD -  MWF next HD 04/09/2022. Hold heparin. Rest of RFP panel/labs pending.  Changed to 2.0 K bath.   Hypertension/volume  - BP well controlled. Home BP meds have been resumed. Does not appear volume overloaded on exam.  NOTE: Machine error with HD 04/07/2022. Standing wt 58.3 kg today. UF as tolerated.   Anemia  - Last HGB 9.0 04/07/2022. Fe 77 Tsat 40%. . Follow labs.   Nutrition -NOT EATING! Last albumin 3.9 04/02/2022. H/O FFT. Prescribed Megace at HD unit but wasn't on OP med list. Will resume.  Constipation-aggressive bowel program now. Sennakot A and miralax BID  Donell Sliwinski H. Saumya Hukill NP-C 04/09/2022, 10:41 AM  [employer] [phone]

## 2022-04-09 NOTE — Progress Notes (Signed)
Upper extremity venous attempted. Patient in hemodialysis late morning/early afternoon. Will attempt again as schedule and patient availability permits.   Darlin Coco, RDMS, RVT

## 2022-04-09 NOTE — Progress Notes (Signed)
Patient ID: Cynthia Bray, female   DOB: [birth year], 61 y.o.   MRN: [id number] [facility] Surgery Progress Note  6 Days Post-Op  Subjective: CC-  Feels about the same. Thinks she ate a little better yesterday. BM yesterday. Denies dysphagia. Complaining of some LUE swelling that started last night. Calcium up 7.4  Objective: Vital signs in last 24 hours: Temp:  [98.3 F (36.8 C)-98.9 F (37.2 C)] 98.4 F (36.9 C) (06/28 0750) Pulse Rate:  [78-84] 81 (06/28 0750) Resp:  [16-18] 16 (06/28 0750) BP: (114-163)/(61-80) 137/61 (06/28 0750) SpO2:  [100 %] 100 % (06/28 0750) Last BM Date : 04/02/22  Intake/Output from previous day: 06/27 0701 - 06/28 0700 In: 300 [P.O.:300] Out: -  Intake/Output this shift: No intake/output data recorded.  PE: Gen:  Alert, NAD, pleasant HEENT: anterior neck incision clean and dry without erythema or drainage, no significant edema LUE: edema and mild tenderness from upper arm down into the hand, compartments soft  Lab Results:  Recent Labs    04/07/22 0612 04/07/22 1332  WBC 4.9 5.0  HGB 8.7* 9.0*  HCT 28.0* 28.4*  PLT 177 177   BMET Recent Labs    04/07/22 0612 04/07/22 0931 04/07/22 1332 04/07/22 1943 04/09/22 0116 04/09/22 0616  NA 135  --  135  --   --   --   K 5.0  --  5.0  --   --   --   CL 97*  --  99  --   --   --   CO2 20*  --  21*  --   --   --   GLUCOSE 90  --  111*  --   --   --   BUN 42*  --  40*  --   --   --   CREATININE 9.16*  --  9.52*  --   --   --   CALCIUM 7.9*   < > 8.0*   < > 7.2* 7.4*   < > = values in this interval not displayed.   PT/INR No results for input(s): "LABPROT", "INR" in the last 72 hours. CMP     Component Value Date/Time   NA 135 04/07/2022 1332   K 5.0 04/07/2022 1332   CL 99 04/07/2022 1332   CO2 21 (L) 04/07/2022 1332   GLUCOSE 111 (H) 04/07/2022 1332   BUN 40 (H) 04/07/2022 1332   CREATININE 9.52 (H) 04/07/2022 1332   CALCIUM 7.4 (L) 04/09/2022 0616   PROT 7.3 06/17/2021  1905   ALBUMIN 2.8 (L) 04/07/2022 1332   AST 19 06/17/2021 1905   ALT 7 06/17/2021 1905   ALKPHOS 227 (H) 06/17/2021 1905   BILITOT 1.2 06/17/2021 1905   GFRNONAA 4 (L) 04/07/2022 1332   Lipase  No results found for: "LIPASE"     Studies/Results: No results found.  Anti-infectives: Anti-infectives (From admission, onward)    Start     Dose/Rate Route Frequency Ordered Stop   04/03/22 0600  ciprofloxacin (CIPRO) IVPB 400 mg        400 mg 200 mL/hr over 60 Minutes Intravenous On call to O.R. 04/03/22 0550 04/03/22 0850        Assessment/Plan Secondary hyperparathyroidism -POD#6 s/p Neck exploration, Left superior parathyroidectomy (normal), Left inferior parathyroidectomy (normal),  Right superior parathyroidectomy (enlarged, probable adenoma), Suture marking of right inferior parathyroid (normal) 6/22 Dr. Harlow Asa - pain controlled and tolerating diet - Ca up to 7.4, started back on calcium infusion 6/27. Appreciate  nephrology assistance - check u/s of LUE to r/o DVT   ID - cipro periop FEN - renal diet VTE - SCDs, sq heparin with dialysis Foley - none   ESRD - HD MWF HTN HLD CHF - EF 25-30%    LOS: 6 days    Wellington Hampshire, [facility] Surgery 04/09/2022, 8:47 AM Please see Amion for pager number during day hours 7:00am-4:30pm

## 2022-04-10 ENCOUNTER — Inpatient Hospital Stay (HOSPITAL_COMMUNITY): Payer: Medicare Other

## 2022-04-10 DIAGNOSIS — M7989 Other specified soft tissue disorders: Secondary | ICD-10-CM

## 2022-04-10 HISTORY — PX: IR FLUORO GUIDE CV LINE RIGHT: IMG2283

## 2022-04-10 HISTORY — PX: IR US GUIDE VASC ACCESS RIGHT: IMG2390

## 2022-04-10 LAB — CALCIUM
Calcium: 6.7 mg/dL — ABNORMAL LOW (ref 8.9–10.3)
Calcium: 6.6 mg/dL — ABNORMAL LOW (ref 8.9–10.3)
Calcium: 6.6 mg/dL — ABNORMAL LOW (ref 8.9–10.3)
Calcium: 6.4 mg/dL — CL (ref 8.9–10.3)
Calcium: 6.8 mg/dL — ABNORMAL LOW (ref 8.9–10.3)

## 2022-04-10 MED ORDER — SODIUM PHOSPHATES 45 MMOLE/15ML IV SOLN
20.0000 mmol | Freq: Once | INTRAVENOUS | Status: AC
  Administered 2022-04-10: 20 mmol via INTRAVENOUS
  Filled 2022-04-10: qty 6.67

## 2022-04-10 MED ORDER — LIDOCAINE HCL 1 % IJ SOLN
INTRAMUSCULAR | Status: AC
  Filled 2022-04-10: qty 20

## 2022-04-10 MED ORDER — CALCIUM CARBONATE ANTACID 1250 MG/5ML PO SUSP
1000.0000 mg | Freq: Three times a day (TID) | ORAL | Status: DC
  Administered 2022-04-10 (×3): 1000 mg via ORAL
  Filled 2022-04-10 (×4): qty 10

## 2022-04-10 MED ORDER — SODIUM CHLORIDE 0.9 % IV SOLN
4.0000 g | Freq: Once | INTRAVENOUS | Status: AC
  Administered 2022-04-10: 4 g via INTRAVENOUS
  Filled 2022-04-10: qty 40

## 2022-04-10 NOTE — Progress Notes (Signed)
IV team consult for PIV. 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 Also, upper arm is not suitable for IV administration of calcium gluconate, a vesicant. Upon arrival in the room, Ultrasound of arm was being administered to evaluate for thrombus. Thrombus was seen in cephalic vein of left FA. Therefore, there is no availability for PIVs at this time. RN notified.

## 2022-04-10 NOTE — Progress Notes (Addendum)
[facility] Progress Note   Subjective: Had conversation with patients, RNs present. She is not taking calcium carbonate as directed. Discussed risk of low calcium including life threatening cardiac arrhythmias. She verbalizes understanding but she ALWAYS agrees with [place] when we are talking to her. Needs repetitive teaching to educate patient on nature of treatment post parathyroidectomy.   Net UF in HD 3.5 L 04/09/2022. Still above OP EDW. Denies SOB, no edema. UF as tolerated in HD 04/11/2022  Objective Vitals:   04/09/22 1639 04/09/22 2033 04/10/22 0534 04/10/22 0838  BP: (!) 168/86 121/71 129/66 129/68  Pulse: 90 (!) 110 97 (!) 105  Resp: '16 16 16 17  '$ Temp: 98.6 F (37 C) 98.4 F (36.9 C) 98.7 F (37.1 C) 98.4 F (36.9 C)  TempSrc: Oral Oral Oral Oral  SpO2: 99% 100% 99% 99%  Weight:      Height:       Physical Exam General: Chronically ill appearing female in NAD Heart: I2,L7 2/6 systolic M. No R/G Lungs:CTAB Abdomen: NABS, NT Extremities:No LE edema Dialysis Access: R AVG + T/B   Additional Objective Labs: Basic Metabolic Panel: Recent Labs  Lab 04/07/22 0612 04/07/22 0931 04/07/22 1332 04/07/22 1943 04/08/22 0950 04/08/22 1351 04/09/22 0616 04/09/22 1100 04/09/22 1901 04/09/22 2209 04/10/22 0253 04/10/22 0722  NA 135  --  135  --   --   --   --  136  --   --   --   --   K 5.0  --  5.0  --   --   --   --  4.2  --   --   --   --   CL 97*  --  99  --   --   --   --  99  --   --   --   --   CO2 20*  --  21*  --   --   --   --  22  --   --   --   --   GLUCOSE 90  --  111*  --   --   --   --  104*  --   --   --   --   BUN 42*  --  40*  --   --   --   --  22  --   --   --   --   CREATININE 9.16*  --  9.52*  --   --   --   --  7.16*  --   --   --   --   CALCIUM 7.9*   < > 8.0*   < > 6.1*   < > 7.4* 7.9*   < > 7.0* 6.7* 6.6*  PHOS 2.1*  --  2.1*  --  2.0*  --  2.0* 1.9*  --   --   --   --    < > = values in this interval not displayed.   Liver  Function Tests: Recent Labs  Lab 04/07/22 0612 04/07/22 1332 04/09/22 1100  ALBUMIN 2.6* 2.8* 2.6*   No results for input(s): "LIPASE", "AMYLASE" in the last 168 hours. CBC: Recent Labs  Lab 04/05/22 0045 04/07/22 0612 04/07/22 1332  WBC 6.1 4.9 5.0  HGB 9.2* 8.7* 9.0*  HCT 29.0* 28.0* 28.4*  MCV 89.8 89.2 88.8  PLT 166 177 177   Blood Culture    Component Value Date/Time   SDES BLOOD LEFT ANTECUBITAL  05/08/2021 0831   SPECREQUEST  05/08/2021 0831    BOTTLES DRAWN AEROBIC AND ANAEROBIC Blood Culture results may not be optimal due to an inadequate volume of blood received in culture bottles   CULT  05/08/2021 0831    NO GROWTH 5 DAYS Performed at [facility], [place] [address], [place], [place] [address]    REPTSTATUS 05/13/2021 FINAL 05/08/2021 0831    Cardiac Enzymes: No results for input(s): "CKTOTAL", "CKMB", "CKMBINDEX", "TROPONINI" in the last 168 hours. CBG: No results for input(s): "GLUCAP" in the last 168 hours. Iron Studies: No results for input(s): "IRON", "TIBC", "TRANSFERRIN", "FERRITIN" in the last 72 hours. '@[email or web address]'$ @ Studies/Results: No results found. 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 Catalina Antigua. 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 All lobes sent for pathology.  Hypocalcemia -  Hungry bone syndrome s/p parathyroidectomy. C  Ca+ 7.7 this AM, magnesium 1.9 PO4 2.0. Calcium Gluconate 4 grams IV today over 4 hours. Checking availability of Calcium suspension. Will give Calcium suspension (500 mg elemental Ca+ per 5 mls) 10 ml PO TID. Do not give with meals. Patient nonadherent with meds. Long discussion with patient about importance of taking these medications as directed as noted above.  Unfortunately only have 2.5 Ca bath for HD. Can 3.0 Ca+ bath as OP. Check Ca q 6 hours. PLEASE WAIT TWO HOURS POST HD TO DRAW LABS.  Metabolic bone disease - hypocalcemia as noted above.  Phos low as well. Will replete today. Dc'd binders. D/c outpatient parsabiv on discharge.   ESRD -  MWF next HD 04/11/2022. Hold heparin. Rest of RFP panel/labs pending.  Changed to 2.0 K bath.   Hypertension/volume  - BP well controlled. Home BP meds have been resumed. Does not appear volume overloaded on exam.  NOTE: Machine error with HD 04/07/2022. Standing wt 58.3 kg today. UF as tolerated.   Anemia  - Last HGB 9.0 04/07/2022. Fe 77 Tsat 40%. . Follow labs.   Nutrition -NOT EATING! Last albumin 3.9 04/02/2022. H/O FFT. Prescribed Megace at HD unit but wasn't on OP med list. Will resume.  Constipation-aggressive bowel program now. Sennakot A and miralax BID 9.   Possible LUE DVT-LUE completed. Superficial thrombosis of L cephalic vein present. No DVT. Per primary.    Rilyn Upshaw H. Kelis Plasse NP-C 04/10/2022, 10:04 AM  [employer] [phone]

## 2022-04-10 NOTE — Procedures (Signed)
Interventional Radiology Procedure Note  Procedure:  Image guided right EJ tunneled cuffed CVC.   Marland Kitchen  Complications: None Recommendations:  - Ok to use - Do not submerge - Routine care   Signed,  Dulcy Fanny. Nobuko Newport, DO

## 2022-04-10 NOTE — Progress Notes (Signed)
Patient ID: Cynthia Bray, female   DOB: 25-May-1961, 61 y.o.   MRN: [id number] 7 Days Post-Op    Subjective: Reports she ate better yesterday ROS negative except as listed above. Objective: Vital signs in last 24 hours: Temp:  [97.8 F (36.6 C)-98.7 F (37.1 C)] 98.7 F (37.1 C) (06/29 0534) Pulse Rate:  [75-110] 97 (06/29 0534) Resp:  [15-20] 16 (06/29 0534) BP: (121-172)/(56-99) 129/66 (06/29 0534) SpO2:  [99 %-100 %] 99 % (06/29 0534) Weight:  [55.1 kg-58.3 kg] 55.1 kg (06/28 1553) Last BM Date : 04/08/22  Intake/Output from previous day: 06/28 0701 - 06/29 0700 In: -  Out: 1800  Intake/Output this shift: No intake/output data recorded.  General appearance: alert and cooperative Neck: incision CDI Extremities: access RUE with thrill, some LUE edema  Lab Results: CBC  Recent Labs    04/07/22 1332  WBC 5.0  HGB 9.0*  HCT 28.4*  PLT 177   BMET Recent Labs    04/07/22 1332 04/07/22 1943 04/09/22 1100 04/09/22 1901 04/09/22 2209 04/10/22 0253  NA 135  --  136  --   --   --   K 5.0  --  4.2  --   --   --   CL 99  --  99  --   --   --   CO2 21*  --  22  --   --   --   GLUCOSE 111*  --  104*  --   --   --   BUN 40*  --  22  --   --   --   CREATININE 9.52*  --  7.16*  --   --   --   CALCIUM 8.0*   < > 7.9*   < > 7.0* 6.7*   < > = values in this interval not displayed.   PT/INR No results for input(s): "LABPROT", "INR" in the last 72 hours. ABG No results for input(s): "PHART", "HCO3" in the last 72 hours.  Invalid input(s): "PCO2", "PO2"  Studies/Results: No results found.  Anti-infectives: Anti-infectives (From admission, onward)    Start     Dose/Rate Route Frequency Ordered Stop   04/03/22 0600  ciprofloxacin (CIPRO) IVPB 400 mg        400 mg 200 mL/hr over 60 Minutes Intravenous On call to O.R. 04/03/22 0550 04/03/22 0850       Assessment/Plan: Secondary hyperparathyroidism -S/P Neck exploration, Left superior parathyroidectomy (normal),  Left inferior parathyroidectomy (normal),  Right superior parathyroidectomy (enlarged, probable adenoma), Suture marking of right inferior parathyroid (normal) 6/22 Dr. Harlow Asa - pain controlled and tolerating diet - Ca 6.7, appreciate Nephrology management of this - check u/s of LUE to r/o DVT   ID - cipro periop FEN - renal diet VTE - SCDs, sq heparin with dialysis Foley - none   ESRD - HD MWF HTN HLD CHF - EF 25-30%  LOS: 7 days    Georganna Skeans, MD, MPH, FACS Trauma & General Surgery Use [email or web address] to contact on call provider  04/10/2022

## 2022-04-10 NOTE — Progress Notes (Signed)
Left UE venous duplex study completed. Please see CV Proc for preliminary results.  Joeleen Wortley BS, RVT 04/10/2022 11:28 AM

## 2022-04-11 LAB — RENAL FUNCTION PANEL
Albumin: 2.7 g/dL — ABNORMAL LOW (ref 3.5–5.0)
Anion gap: 13 (ref 5–15)
BUN: 17 mg/dL (ref 8–23)
CO2: 27 mmol/L (ref 22–32)
Calcium: 6.3 mg/dL — CL (ref 8.9–10.3)
Chloride: 90 mmol/L — ABNORMAL LOW (ref 98–111)
Creatinine, Ser: 7.04 mg/dL — ABNORMAL HIGH (ref 0.44–1.00)
GFR, Estimated: 6 mL/min — ABNORMAL LOW (ref >60)
Glucose, Bld: 105 mg/dL — ABNORMAL HIGH (ref 70–99)
Phosphorus: 2.2 mg/dL — ABNORMAL LOW (ref 2.5–4.6)
Potassium: 3.5 mmol/L (ref 3.5–5.1)
Sodium: 130 mmol/L — ABNORMAL LOW (ref 135–145)

## 2022-04-11 LAB — CALCIUM
Calcium: 6.5 mg/dL — ABNORMAL LOW (ref 8.9–10.3)
Calcium: 7.1 mg/dL — ABNORMAL LOW (ref 8.9–10.3)

## 2022-04-11 LAB — PHOSPHORUS: Phosphorus: 2.2 mg/dL — ABNORMAL LOW (ref 2.5–4.6)

## 2022-04-11 MED ORDER — CALCITRIOL 0.5 MCG PO CAPS
2.0000 ug | ORAL_CAPSULE | Freq: Every day | ORAL | Status: DC
  Administered 2022-04-11 – 2022-04-12 (×2): 2 ug via ORAL
  Filled 2022-04-11 (×2): qty 4

## 2022-04-11 MED ORDER — LIDOCAINE HCL (PF) 1 % IJ SOLN: 5.0000 mL | INTRAMUSCULAR | Status: DC | PRN

## 2022-04-11 MED ORDER — LIDOCAINE-PRILOCAINE 2.5-2.5 % EX CREA: 1.0000 | TOPICAL_CREAM | CUTANEOUS | Status: DC | PRN

## 2022-04-11 MED ORDER — HEPARIN SODIUM (PORCINE) 5000 UNIT/ML IJ SOLN
5000.0000 [IU] | Freq: Three times a day (TID) | INTRAMUSCULAR | Status: DC
  Administered 2022-04-11 – 2022-04-15 (×11): 5000 [IU] via SUBCUTANEOUS
  Filled 2022-04-11 (×11): qty 1

## 2022-04-11 MED ORDER — SODIUM PHOSPHATES 45 MMOLE/15ML IV SOLN
30.0000 mmol | Freq: Once | INTRAVENOUS | Status: AC
  Administered 2022-04-11: 30 mmol via INTRAVENOUS
  Filled 2022-04-11: qty 10

## 2022-04-11 MED ORDER — PENTAFLUOROPROP-TETRAFLUOROETH EX AERO: 1.0000 | INHALATION_SPRAY | CUTANEOUS | Status: DC | PRN

## 2022-04-11 MED ORDER — TRAMADOL HCL 50 MG PO TABS: 50.0000 mg | ORAL_TABLET | Freq: Two times a day (BID) | ORAL | Status: DC | PRN

## 2022-04-11 MED ORDER — CALCIUM CARBONATE ANTACID 1250 MG/5ML PO SUSP
1500.0000 mg | Freq: Three times a day (TID) | ORAL | Status: DC
  Administered 2022-04-11 – 2022-04-12 (×4): 1500 mg via ORAL
  Filled 2022-04-11 (×4): qty 15

## 2022-04-11 MED ORDER — HEPARIN SODIUM (PORCINE) 1000 UNIT/ML DIALYSIS
1000.0000 [IU] | INTRAMUSCULAR | Status: DC | PRN
  Filled 2022-04-11: qty 1

## 2022-04-11 MED ORDER — OXYCODONE HCL 5 MG PO TABS: 5.0000 mg | ORAL_TABLET | ORAL | Status: DC | PRN

## 2022-04-11 MED ORDER — SODIUM CHLORIDE 0.9 % IV SOLN
4.0000 g | Freq: Three times a day (TID) | INTRAVENOUS | Status: AC
  Administered 2022-04-11 – 2022-04-12 (×2): 4 g via INTRAVENOUS
  Filled 2022-04-11 (×3): qty 40

## 2022-04-11 MED ORDER — HEPARIN SODIUM (PORCINE) 1000 UNIT/ML DIALYSIS
3000.0000 [IU] | Freq: Once | INTRAMUSCULAR | Status: AC
  Administered 2022-04-11: 3000 [IU] via INTRAVENOUS_CENTRAL
  Filled 2022-04-11: qty 3

## 2022-04-11 NOTE — Plan of Care (Signed)
  Problem: Nutrition: Goal: Adequate nutrition will be maintained Outcome: Progressing   Problem: Pain Managment: Goal: General experience of comfort will improve Outcome: Progressing   Problem: Safety: Goal: Ability to remain free from injury will improve Outcome: Progressing   

## 2022-04-11 NOTE — Progress Notes (Signed)
Sent chat to [facility] nephrology PA pt calcium level 6.3, no new orders received

## 2022-04-11 NOTE — Progress Notes (Addendum)
Patient ID: Cynthia Bray, female   DOB: Mar 04, 1961, 61 y.o.   MRN: [id number] [facility] Surgery Progress Note  8 Days Post-Op  Subjective: CC-  No complaints. Sitting up in bed eating her breakfast. Denies abdominal pain, n/v. Calcium 6.5 this morning  Objective: Vital signs in last 24 hours: Temp:  [98.2 F (36.8 C)-98.8 F (37.1 C)] 98.5 F (36.9 C) (06/30 0818) Pulse Rate:  [81-87] 86 (06/30 0818) Resp:  [16-18] 18 (06/30 0818) BP: (95-129)/(52-81) 129/81 (06/30 0818) SpO2:  [98 %-100 %] 100 % (06/30 0818) Last BM Date : 04/10/22  Intake/Output from previous day: 06/29 0701 - 06/30 0700 In: 300 [P.O.:300] Out: 1 [Urine:1] Intake/Output this shift: No intake/output data recorded.  PE: General appearance: alert and cooperative Neck: incision CDI Extremities: LUE edema significantly improved  Lab Results:  No results for input(s): "WBC", "HGB", "HCT", "PLT" in the last 72 hours. BMET Recent Labs    04/09/22 1100 04/09/22 1901 04/10/22 2310 04/11/22 0336  NA 136  --   --   --   K 4.2  --   --   --   CL 99  --   --   --   CO2 22  --   --   --   GLUCOSE 104*  --   --   --   BUN 22  --   --   --   CREATININE 7.16*  --   --   --   CALCIUM 7.9*   < > 6.8* 6.5*   < > = values in this interval not displayed.   PT/INR No results for input(s): "LABPROT", "INR" in the last 72 hours. CMP     Component Value Date/Time   NA 136 04/09/2022 1100   K 4.2 04/09/2022 1100   CL 99 04/09/2022 1100   CO2 22 04/09/2022 1100   GLUCOSE 104 (H) 04/09/2022 1100   BUN 22 04/09/2022 1100   CREATININE 7.16 (H) 04/09/2022 1100   CALCIUM 6.5 (L) 04/11/2022 0336   PROT 7.3 06/17/2021 1905   ALBUMIN 2.6 (L) 04/09/2022 1100   AST 19 06/17/2021 1905   ALT 7 06/17/2021 1905   ALKPHOS 227 (H) 06/17/2021 1905   BILITOT 1.2 06/17/2021 1905   GFRNONAA 6 (L) 04/09/2022 1100   Lipase  No results found for: "LIPASE"     Studies/Results: VAS [place] UPPER EXTREMITY VENOUS  DUPLEX  Result Date: 04/10/2022 UPPER VENOUS STUDY  Patient Name:  Cynthia Bray  Date of Exam:   04/10/2022 Medical Rec #: [id number]      Accession #:    [id number] Date of Birth: [birth year]     Patient Gender: F Patient Age:   3 years Exam Location:  [facility] Procedure:      VAS [place] UPPER EXTREMITY VENOUS DUPLEX Referring Phys: Jerene Pitch Jia Dottavio --------------------------------------------------------------------------------  Indications: Swelling Comparison Study: No previous exam noted. Performing Technologist: Bobetta Lime BS, RVT  Examination Guidelines: A complete evaluation includes B-mode imaging, spectral Doppler, color Doppler, and power Doppler as needed of all accessible portions of each vessel. Bilateral testing is considered an integral part of a complete examination. Limited examinations for reoccurring indications may be performed as noted.  Right Findings: +----------+------------+---------+-----------+----------+-------+ RIGHT     CompressiblePhasicitySpontaneousPropertiesSummary +----------+------------+---------+-----------+----------+-------+ Subclavian    Full       Yes       Yes                      +----------+------------+---------+-----------+----------+-------+  Left Findings: +----------+------------+---------+-----------+----------+-------+ LEFT      CompressiblePhasicitySpontaneousPropertiesSummary +----------+------------+---------+-----------+----------+-------+ IJV           Full       Yes       Yes                      +----------+------------+---------+-----------+----------+-------+ Subclavian    Full       Yes       Yes                      +----------+------------+---------+-----------+----------+-------+ Axillary      Full       Yes       Yes                      +----------+------------+---------+-----------+----------+-------+ Brachial      Full                                           +----------+------------+---------+-----------+----------+-------+ Radial        Full                                          +----------+------------+---------+-----------+----------+-------+ Ulnar         Full                                          +----------+------------+---------+-----------+----------+-------+ Cephalic      None                                          +----------+------------+---------+-----------+----------+-------+ Basilic       Full                                          +----------+------------+---------+-----------+----------+-------+ Unable to visualize the left cephalic vein in the upper arm.  Summary:  Right: No evidence of thrombosis in the subclavian.  Left: No evidence of deep vein thrombosis in the upper extremity. Findings consistent with acute superficial vein thrombosis involving the left cephalic vein.  *See table(s) above for measurements and observations.  Diagnosing physician: Monica Martinez MD Electronically signed by Monica Martinez MD on 04/10/2022 at 4:46:11 PM.    Final    IR Fluoro Guide CV Line Right  Result Date: 04/10/2022 INDICATION: 61 year old female referred for tunneled central venous catheter EXAM: IMAGE GUIDED TUNNELED CENTRAL VENOUS CATHETER MEDICATIONS: None ANESTHESIA/SEDATION: Moderate (none FLUOROSCOPY: Radiation Exposure Index (as provided by the fluoroscopic device): 1 mGy Kerma COMPLICATIONS: None PROCEDURE: Informed written consent was obtained from the patient after a discussion of the risks, benefits, and alternatives to treatment. Questions regarding the procedure were encouraged and answered. The right neck and chest were prepped with chlorhexidine in a sterile fashion, and a sterile drape was applied covering the operative field. Maximum barrier sterile technique with sterile gowns and gloves were used for the procedure. A timeout was performed prior to the initiation of the procedure. After written  informed consent  was obtained, patient was placed in the supine position on angiographic table. Patency of the right external jugular vein was confirmed with ultrasound with image documentation. Patient was prepped and draped in the usual sterile fashion including the right neck and right superior chest. Using ultrasound guidance, the skin and subcutaneous tissues overlying the right external jugular vein were generously infiltrated with 1% lidocaine without epinephrine. Using ultrasound guidance, the vein was punctured with a micropuncture needle, and an 018 wire was advanced into the right heart confirming venous access. A small stab incision was made with an 11 blade scalpel. Peel-away sheath was placed over the wire, and then the wire was removed, marking the wire for estimation of internal catheter length. The chest wall was then generously infiltrated with 1% lidocaine for local anesthesia along the tissue tract. Small stab incision was made with 11 blade scalpel, and then the catheter was back tunneled to the puncture site at the right internal jugular vein. Catheter was pulled through the tract, with the catheter amputated at 21 cm. Catheter was advanced through the peel-away sheath, and the peel-away sheath was removed. Final image was stored. The catheter was anchored to the chest wall with 2 retention sutures, and Derma bond was used to seal the right internal jugular vein incision site and at the right chest wall. Patient tolerated the procedure well and remained hemodynamically stable throughout. No complications were encountered and no significant blood loss was encountered. FINDINGS: After catheter placement, the tip lies within the superior cavoatrial junction. The catheter aspirates and flushes normally and is ready for immediate use. IMPRESSION: Status post right external jugular tunneled cuffed central venous catheter. Signed, Dulcy Fanny. Nadene Rubins, RPVI Vascular and Interventional Radiology  Specialists [facility] Radiology Electronically Signed   By: Corrie Mckusick D.O.   On: 04/10/2022 15:02   IR US Guide Vasc Access Right  Result Date: 04/10/2022 INDICATION: 61 year old female referred for tunneled central venous catheter EXAM: IMAGE GUIDED TUNNELED CENTRAL VENOUS CATHETER MEDICATIONS: None ANESTHESIA/SEDATION: Moderate (none FLUOROSCOPY: Radiation Exposure Index (as provided by the fluoroscopic device): 1 mGy Kerma COMPLICATIONS: None PROCEDURE: Informed written consent was obtained from the patient after a discussion of the risks, benefits, and alternatives to treatment. Questions regarding the procedure were encouraged and answered. The right neck and chest were prepped with chlorhexidine in a sterile fashion, and a sterile drape was applied covering the operative field. Maximum barrier sterile technique with sterile gowns and gloves were used for the procedure. A timeout was performed prior to the initiation of the procedure. After written informed consent was obtained, patient was placed in the supine position on angiographic table. Patency of the right external jugular vein was confirmed with ultrasound with image documentation. Patient was prepped and draped in the usual sterile fashion including the right neck and right superior chest. Using ultrasound guidance, the skin and subcutaneous tissues overlying the right external jugular vein were generously infiltrated with 1% lidocaine without epinephrine. Using ultrasound guidance, the vein was punctured with a micropuncture needle, and an 018 wire was advanced into the right heart confirming venous access. A small stab incision was made with an 11 blade scalpel. Peel-away sheath was placed over the wire, and then the wire was removed, marking the wire for estimation of internal catheter length. The chest wall was then generously infiltrated with 1% lidocaine for local anesthesia along the tissue tract. Small stab incision was made with 11  blade scalpel, and then the catheter was back tunneled to the  puncture site at the right internal jugular vein. Catheter was pulled through the tract, with the catheter amputated at 21 cm. Catheter was advanced through the peel-away sheath, and the peel-away sheath was removed. Final image was stored. The catheter was anchored to the chest wall with 2 retention sutures, and Derma bond was used to seal the right internal jugular vein incision site and at the right chest wall. Patient tolerated the procedure well and remained hemodynamically stable throughout. No complications were encountered and no significant blood loss was encountered. FINDINGS: After catheter placement, the tip lies within the superior cavoatrial junction. The catheter aspirates and flushes normally and is ready for immediate use. IMPRESSION: Status post right external jugular tunneled cuffed central venous catheter. Signed, Dulcy Fanny. Nadene Rubins, RPVI Vascular and Interventional Radiology Specialists [facility] Radiology Electronically Signed   By: Corrie Mckusick D.O.   On: 04/10/2022 15:02    Anti-infectives: Anti-infectives (From admission, onward)    Start     Dose/Rate Route Frequency Ordered Stop   04/03/22 0600  ciprofloxacin (CIPRO) IVPB 400 mg        400 mg 200 mL/hr over 60 Minutes Intravenous On call to O.R. 04/03/22 0550 04/03/22 0850        Assessment/Plan Secondary hyperparathyroidism -S/P Neck exploration, Left superior parathyroidectomy (normal), Left inferior parathyroidectomy (normal),  Right superior parathyroidectomy (enlarged, probable adenoma), Suture marking of right inferior parathyroid (normal) 6/22 Dr. Harlow Asa - pain controlled and tolerating diet - Ca 6.5, appreciate Nephrology management of this   ID - cipro periop FEN - renal diet, bowel regimen VTE - SCDs, sq heparin  Foley - none   LUE swelling - u/s negative for DVT, showed SVT. Swelling much improved ESRD - HD MWF HTN HLD CHF - EF  25-30%    LOS: 8 days    Wellington Hampshire, [facility] Surgery 04/11/2022, 9:33 AM Please see Amion for pager number during day hours 7:00am-4:30pm

## 2022-04-11 NOTE — TOC Initial Note (Signed)
Transition of Care [facility]) - Initial/Assessment Note    Patient Details  Name: Cynthia Bray MRN: [id number] Date of Birth: Oct 07, 1961  Transition of Care [facility]) CM/SW Contact:    Cynthia Favre, RN Phone Number: 04/11/2022, 10:54 AM  Clinical Narrative:                  Spoke to patient at bedside. Patient from home with sister . Number on face sheet is son's.   Consult for PCP. Patient states she thinks her son has already arranged a PCP. NCM called [facility] [phone] and left a voicemail, await call back .   Patient can call number on insurance card and be provided a list of providers in network. If patient [name] know of a provider she is interested in establishing care with they can call office directly to see if they are accepting new patients with her insurance.  Expected Discharge Plan: Home/Self Care Barriers to Discharge: Continued Medical Work up   Patient Goals and CMS Choice Patient states their goals for this hospitalization and ongoing recovery are:: to return to home      Expected Discharge Plan and Services Expected Discharge Plan: Home/Self Care In-house Referral: PCP / [facility] Discharge Planning Services: CM Consult   Living arrangements for the past 2 months: Single Family Home                 DME Arranged: N/A DME Agency: NA       HH Arranged: NA HH Agency: NA        Prior Living Arrangements/Services Living arrangements for the past 2 months: Single Family Home Lives with:: Siblings Patient language and need for interpreter reviewed:: Yes Do you feel safe going back to the place where you live?: Yes      Need for Family Participation in Patient Care: Yes (Comment) Care giver support system in place?: Yes (comment)   Criminal Activity/Legal Involvement Pertinent to Current Situation/Hospitalization: No - Comment as needed  Activities of Daily Living Home Assistive Devices/Equipment: Blood pressure cuff, Eyeglasses ADL Screening  (condition at time of admission) Patient's cognitive ability adequate to safely complete daily activities?: Yes Is the patient deaf or have difficulty hearing?: No Does the patient have difficulty seeing, even when wearing glasses/contacts?: No Does the patient have difficulty concentrating, remembering, or making decisions?: No Patient able to express need for assistance with ADLs?: Yes Does the patient have difficulty dressing or bathing?: No Independently performs ADLs?: Yes (appropriate for developmental age) Does the patient have difficulty walking or climbing stairs?: Yes Weakness of Legs: None Weakness of Arms/Hands: None  Permission Sought/Granted   Permission granted to share information with : Yes, Verbal Permission Granted  Share Information with NAME: son Cynthia Bray [phone]           Emotional Assessment Appearance:: Appears stated age Attitude/Demeanor/Rapport: Engaged Affect (typically observed): Accepting Orientation: : Oriented to Self, Oriented to Place, Oriented to  Time, Oriented to Situation Alcohol / Substance Use: Not Applicable Psych Involvement: No (comment)  Admission diagnosis:  Hyperparathyroidism ([place]) [E21.3] Secondary hyperparathyroidism of renal origin [facility]) [N25.81] Patient Active Problem List   Diagnosis Date Noted   Hyperparathyroidism ([place]) 04/03/2022   Secondary hyperparathyroidism of renal origin ([place]) 04/03/2022   NICM (nonischemic cardiomyopathy) ([place]) 02/20/2022   Angioedema due to angiotensin converting enzyme inhibitor (ACE-I) 02/20/2022   Hyperlipidemia with target LDL less than 70 02/20/2022   Secondary hyperparathyroidism ([place]) 02/20/2022   Protein-calorie malnutrition, severe 05/09/2021  Pulmonary edema 33/35/4562   Chronic systolic heart failure ([place]) 05/07/2021   ESRD (end stage renal disease) ([place]) 05/07/2021   Hypercalcemia 05/07/2021   HTN (hypertension) 05/07/2021   Tachycardia 05/07/2021   PCP:  Cynthia Bray, No Pharmacy:    [facility], [place] [address] Phone: [phone] Fax: [facility] [address] [place] [address] Phone: [phone] Fax: [phone]     Social Determinants of Health (SDOH) Interventions    Readmission Risk Interventions     No data to display

## 2022-04-11 NOTE — Progress Notes (Signed)
[facility] Progress Note   Subjective: Ca remains in mid 6s to 7 range, asymptomatic. Has not had any low Ca symptoms since the surgery. She denies refusing any CaCO3 tabs or other medications since admission. Her missed doses she thinks may have been while at HD. She has not c/o's.  Her PTH prior to surgery was > 4000.   Net UF in HD 3.5 L 04/09/2022. Still above OP EDW. Denies SOB, no edema. UF as tolerated in HD 04/11/2022  Objective Vitals:   04/10/22 1744 04/10/22 2203 04/11/22 0542 04/11/22 0818  BP: 115/62 110/72 (!) 95/52 129/81  Pulse: 87 84 81 86  Resp: '18 16  18  '$ Temp: 98.5 F (36.9 C) 98.8 F (37.1 C) 98.2 F (36.8 C) 98.5 F (36.9 C)  TempSrc: Oral Oral Oral   SpO2: 100% 100% 98% 100%  Weight:      Height:       Physical Exam General: Chronically ill appearing female in NAD Heart: J6,G8 2/6 systolic M. No R/G Lungs:CTAB Abdomen: NABS, NT Extremities:No LE edema Dialysis Access: R AVG + T/B   Additional Objective Labs: Basic Metabolic Panel: Recent Labs  Lab 04/07/22 0612 04/07/22 0931 04/07/22 1332 04/07/22 1943 04/09/22 0616 04/09/22 1100 04/09/22 1901 04/10/22 1649 04/10/22 2310 04/11/22 0336  NA 135  --  135  --   --  136  --   --   --   --   K 5.0  --  5.0  --   --  4.2  --   --   --   --   CL 97*  --  99  --   --  99  --   --   --   --   CO2 20*  --  21*  --   --  22  --   --   --   --   GLUCOSE 90  --  111*  --   --  104*  --   --   --   --   BUN 42*  --  40*  --   --  22  --   --   --   --   CREATININE 9.16*  --  9.52*  --   --  7.16*  --   --   --   --   CALCIUM 7.9*   < > 8.0*   < > 7.4* 7.9*   < > 6.4* 6.8* 6.5*  PHOS 2.1*  --  2.1*   < > 2.0* 1.9*  --   --   --  2.2*   < > = values in this interval not displayed.    Liver Function Tests: Recent Labs  Lab 04/07/22 0612 04/07/22 1332 04/09/22 1100  ALBUMIN 2.6* 2.8* 2.6*    No results for input(s): "LIPASE", "AMYLASE" in the last 168 hours. CBC: Recent Labs   Lab 04/05/22 0045 04/07/22 0612 04/07/22 1332  WBC 6.1 4.9 5.0  HGB 9.2* 8.7* 9.0*  HCT 29.0* 28.0* 28.4*  MCV 89.8 89.2 88.8  PLT 166 177 177    Blood Culture    Component Value Date/Time   SDES BLOOD LEFT ANTECUBITAL 05/08/2021 0831   SPECREQUEST  05/08/2021 0831    BOTTLES DRAWN AEROBIC AND ANAEROBIC Blood Culture results may not be optimal due to an inadequate volume of blood received in culture bottles   CULT  05/08/2021 0831    NO GROWTH 5 DAYS Performed at [facility]  Lab, [address] [place], [place] [address]    REPTSTATUS 05/13/2021 FINAL 05/08/2021 0831    Medications:  sodium chloride 10 mL/hr at 04/10/22 2319    calcitRIOL  2 mcg Oral Daily   calcium carbonate (dosed in mg elemental calcium)  1,500 mg of elemental calcium Oral TID   Chlorhexidine Gluconate Cloth  6 each Topical Q0600   hydrALAZINE  25 mg Oral Q8H   isosorbide mononitrate  30 mg Oral Daily   megestrol  400 mg Oral Daily   metoprolol succinate  100 mg Oral Daily   polyethylene glycol  17 g Oral BID   senna-docusate  1 tablet Oral BID     Dialysis Orders: [facility] MWF 3:45 hr 160NRe 350/500 52 kg 2.0 K/2.0Ca  high Ca bath at dc 3.0  AVG -Heparin 2000 units IV TIW -Parsabiv 15 mg IV TIW discontinue on discharge      Assessment/Plan:  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 Catalina Antigua. 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 All lobes sent for pathology.  Hypocalcemia -  Hungry bone syndrome s/p parathyroidectomy. Pt has severe case of "hungry bones" syndrome, which is not really surprising since her PTH preop was > 4000.  She denies refusing any po meds while here, must has missed doses while off the floor (HD, etc). She is asymptomatic. We will cont to escalate her po CaCO3 (^1500 elemental Ca tid today) and vdra (^ rocaltrol to 2.0 ug qd today) as needed until serum Ca is stable > 7-8 off of IV Ca. Will cont IV Ca gluc bolus 4 gm prn 3-4 x per day for Ca < 7.0. Pt frustrated at  length of stay, I explained to her that this is expected for certain patients w/ severe HPTh after PTX surgery. We are also trying to get a higher Ca bath for her dialysis, the new HD system is only offering a 2.5 Ca bath. Outpt she could get 3.0 Ca++ bath.    Metabolic bone disease - hypocalcemia as noted above.  Phos low as well. Repleted IV overnight. Binders dc'd.   ESRD -  MWF HD. HD today.   Hypertension/volume  - BP well controlled. Home BP meds have been resumed. Does not appear volume overloaded on exam.   Anemia  - Last HGB 9.0 04/07/2022. Fe 77 Tsat 40%. . Follow labs.   Nutrition - Last albumin 3.9 04/02/2022. H/O FFT. Prescribed Megace at HD unit but wasn't on OP med list. Will resume.  Constipation- aggressive bowel program now. Sennakot A and miralax BID 9.   Possible LUE DVT-LUE completed. Superficial thrombosis of L cephalic vein present. No DVT. Per primary.    Kelly Splinter  MD 04/11/2022, 9:14 AM

## 2022-04-12 LAB — CALCIUM
Calcium: 7 mg/dL — ABNORMAL LOW (ref 8.9–10.3)
Calcium: 7.1 mg/dL — ABNORMAL LOW (ref 8.9–10.3)
Calcium: 7.8 mg/dL — ABNORMAL LOW (ref 8.9–10.3)

## 2022-04-12 LAB — PHOSPHORUS: Phosphorus: 2.5 mg/dL (ref 2.5–4.6)

## 2022-04-12 MED ORDER — CALCITRIOL 0.5 MCG PO CAPS
3.0000 ug | ORAL_CAPSULE | Freq: Every day | ORAL | Status: DC
  Administered 2022-04-13: 3 ug via ORAL
  Filled 2022-04-12: qty 6

## 2022-04-12 MED ORDER — CALCIUM CARBONATE ANTACID 1250 MG/5ML PO SUSP
2000.0000 mg | Freq: Three times a day (TID) | ORAL | Status: DC
  Filled 2022-04-12 (×2): qty 20

## 2022-04-12 MED ORDER — SODIUM CHLORIDE 0.9% FLUSH: 10.0000 mL | INTRAVENOUS | Status: DC | PRN

## 2022-04-12 MED ORDER — SODIUM CHLORIDE 0.9 % IV SOLN
4.0000 g | Freq: Three times a day (TID) | INTRAVENOUS | Status: AC
  Administered 2022-04-12 (×2): 4 g via INTRAVENOUS
  Filled 2022-04-12 (×2): qty 40

## 2022-04-12 MED ORDER — METOPROLOL TARTRATE 12.5 MG HALF TABLET
12.5000 mg | ORAL_TABLET | Freq: Two times a day (BID) | ORAL | Status: DC
  Administered 2022-04-12 – 2022-04-15 (×3): 12.5 mg via ORAL
  Filled 2022-04-12 (×6): qty 1

## 2022-04-12 MED ORDER — HYDRALAZINE HCL 10 MG PO TABS
10.0000 mg | ORAL_TABLET | Freq: Three times a day (TID) | ORAL | Status: DC
  Administered 2022-04-12 – 2022-04-13 (×3): 10 mg via ORAL
  Filled 2022-04-12 (×3): qty 1

## 2022-04-12 MED ORDER — CALCIUM CARBONATE ANTACID 1250 MG/5ML PO SUSP
2500.0000 mg | Freq: Once | ORAL | Status: AC
  Administered 2022-04-12: 2500 mg via ORAL
  Filled 2022-04-12: qty 25

## 2022-04-12 MED ORDER — CALCITRIOL 0.5 MCG PO CAPS
1.0000 ug | ORAL_CAPSULE | Freq: Once | ORAL | Status: AC
  Administered 2022-04-12: 1 ug via ORAL
  Filled 2022-04-12: qty 2

## 2022-04-12 MED ORDER — CALCIUM CARBONATE ANTACID 1250 MG/5ML PO SUSP
2000.0000 mg | Freq: Three times a day (TID) | ORAL | Status: DC
  Administered 2022-04-12 – 2022-04-13 (×3): 2000 mg via ORAL
  Filled 2022-04-12 (×5): qty 20

## 2022-04-12 MED ORDER — ORAL CARE MOUTH RINSE: 15.0000 mL | OROMUCOSAL | Status: DC | PRN

## 2022-04-12 NOTE — Progress Notes (Addendum)
[facility] Progress Note   Subjective: Ca++ 7.0 this am. Got 2 four gm ca gluc boluses yesterday= 8 gm IV Ca last 24 hrs.   Objective Vitals:   04/11/22 2007 04/12/22 0353 04/12/22 0736 04/12/22 1108  BP: 109/63 90/60 114/62 131/76  Pulse: 95 100 95   Resp: '16 16 16   '$ Temp: 98.7 F (37.1 C) 98.7 F (37.1 C) 99.6 F (37.6 C)   TempSrc:  Oral Oral   SpO2: 100% 100% 100%   Weight:      Height:       Physical Exam General: Chronically ill appearing female in NAD Heart: U7,O5 2/6 systolic M. 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 Catalina Antigua. 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 All lobes sent for pathology.  Hypocalcemia -  Hungry bone syndrome s/p parathyroidectomy. Pt has severe case of "hungry bones" syndrome with pre-op PTH peak of > 4000.  She is asymptomatic. We will cont to escalate her po CaCO3 (^2000 elemental Ca tid today = 6 gm / d elemental) and vdra (^ rocaltrol to 3.0 ug qd today) as needed until serum Ca is stable > 7.5 off of IV Ca. Will give 2 doses IV ca gluc (4gm) again today. Not sure if we can get a higher Ca++ bath w/ the new HD machines. At outpt HD she could get 3.0 Ca++ bath.  Will follow.   Metabolic bone disease - hypocalcemia as noted above.  Phos low as well. Repleted IV phos recently. Binders dc'd.   ESRD -  MWF HD. Next HD Monday.   HTN/volume  - BP's soft. Euvolemic on exam. On 3 bp meds > have sig lowered the hydralazine and metoprolol, and changed metoprolol to bid dosing. Standing wt was 54.7kg today, up 2.5kg.   Anemia  - Last HGB 9.0 04/07/2022. Tsat 40%. Follow labs.   Nutrition - Last albumin 3.9 04/02/2022. H/O FFT. Prescribed  Megace at HD unit but wasn't on OP med list. Will resume.  Constipation- aggressive bowel program now. Sennakot A and miralax BID 9.   Possible LUE DVT-LUE completed. Superficial thrombosis of L cephalic vein present. No DVT. Per primary.    Rob Finnlee Guarnieri  MD 04/12/2022, 11:43 AM  Recent Labs  Lab 04/07/22 0612 04/07/22 0931 04/07/22 1332 04/07/22 1943 04/09/22 1100 04/09/22 1901 04/11/22 1318 04/11/22 2106 04/12/22 0518  HGB 8.7*  --  9.0*  --   --   --   --   --   --   ALBUMIN 2.6*  --  2.8*  --  2.6*  --  2.7*  --   --   CALCIUM 7.9*   < > 8.0*   < > 7.9*   < > 6.3* 7.1* 7.0*  PHOS 2.1*  --  2.1*   < > 1.9*   < > 2.2*  --  2.5  CREATININE 9.16*  --  9.52*  --  7.16*  --  7.04*  --   --   K 5.0  --  5.0  --  4.2  --  3.5  --   --    < > = values in this interval  not displayed.   Recent Labs  Lab 04/06/22 1253  IRON 77  TIBC 192*   Inpatient medications:  calcitRIOL  2 mcg Oral Daily   calcium carbonate (dosed in mg elemental calcium)  2,000 mg of elemental calcium Oral TID   Chlorhexidine Gluconate Cloth  6 each Topical Q0600   heparin injection (subcutaneous)  5,000 Units Subcutaneous Q8H   hydrALAZINE  25 mg Oral Q8H   isosorbide mononitrate  30 mg Oral Daily   megestrol  400 mg Oral Daily   metoprolol succinate  100 mg Oral Daily   polyethylene glycol  17 g Oral BID   senna-docusate  1 tablet Oral BID    sodium chloride 10 mL/hr at 04/10/22 2319   acetaminophen **OR** acetaminophen, ondansetron **OR** ondansetron (ZOFRAN) IV, mouth rinse, oxyCODONE, sodium chloride flush, traMADol

## 2022-04-12 NOTE — Progress Notes (Signed)
9 Days Post-Op   Subjective/Chief Complaint:  No complaints. Sitting up in bed eating her breakfast. Denies abdominal pain, n/v.  Objective: Vital signs in last 24 hours: Temp:  [98.2 F (36.8 C)-99.6 F (37.6 C)] 99.6 F (37.6 C) (07/01 0736) Pulse Rate:  [76-101] 95 (07/01 0736) Resp:  [14-21] 16 (07/01 0736) BP: (90-148)/(60-101) 114/62 (07/01 0736) SpO2:  [99 %-100 %] 100 % (07/01 0736) Weight:  [55 kg-56.7 kg] (P) 55 kg (06/30 1758) Last BM Date : 04/10/22  Intake/Output from previous day: 06/30 0701 - 07/01 0700 In: 240 [P.O.:240] Out: -  Intake/Output this shift: No intake/output data recorded.   General appearance: alert and cooperative Neck: incision CDI Extremities: LUE edema significantly improved Lab Results:  No results for input(s): "WBC", "HGB", "HCT", "PLT" in the last 72 hours. BMET Recent Labs    04/09/22 1100 04/09/22 1901 04/11/22 1318 04/11/22 2106 04/12/22 0518  NA 136  --  130*  --   --   K 4.2  --  3.5  --   --   CL 99  --  90*  --   --   CO2 22  --  27  --   --   GLUCOSE 104*  --  105*  --   --   BUN 22  --  17  --   --   CREATININE 7.16*  --  7.04*  --   --   CALCIUM 7.9*   < > 6.3* 7.1* 7.0*   < > = values in this interval not displayed.   PT/INR No results for input(s): "LABPROT", "INR" in the last 72 hours. ABG No results for input(s): "PHART", "HCO3" in the last 72 hours.  Invalid input(s): "PCO2", "PO2"  Studies/Results: VAS [place] UPPER EXTREMITY VENOUS DUPLEX  Result Date: 04/10/2022 UPPER VENOUS STUDY  Patient Name:  Cynthia Bray  Date of Exam:   04/10/2022 Medical Rec #: [id number]      Accession #:    [id number] Date of Birth: November 08, 1960     Patient Gender: F Patient Age:   61 years Exam Location:  [facility] Procedure:      VAS [place] UPPER EXTREMITY VENOUS DUPLEX Referring Phys: Jerene Pitch MEUTH --------------------------------------------------------------------------------  Indications: Swelling Comparison Study: No  previous exam noted. Performing Technologist: Bobetta Lime BS, RVT  Examination Guidelines: A complete evaluation includes B-mode imaging, spectral Doppler, color Doppler, and power Doppler as needed of all accessible portions of each vessel. Bilateral testing is considered an integral part of a complete examination. Limited examinations for reoccurring indications may be performed as noted.  Right Findings: +----------+------------+---------+-----------+----------+-------+ RIGHT     CompressiblePhasicitySpontaneousPropertiesSummary +----------+------------+---------+-----------+----------+-------+ Subclavian    Full       Yes       Yes                      +----------+------------+---------+-----------+----------+-------+  Left Findings: +----------+------------+---------+-----------+----------+-------+ LEFT      CompressiblePhasicitySpontaneousPropertiesSummary +----------+------------+---------+-----------+----------+-------+ IJV           Full       Yes       Yes                      +----------+------------+---------+-----------+----------+-------+ Subclavian    Full       Yes       Yes                      +----------+------------+---------+-----------+----------+-------+ Axillary  Full       Yes       Yes                      +----------+------------+---------+-----------+----------+-------+ Brachial      Full                                          +----------+------------+---------+-----------+----------+-------+ Radial        Full                                          +----------+------------+---------+-----------+----------+-------+ Ulnar         Full                                          +----------+------------+---------+-----------+----------+-------+ Cephalic      None                                          +----------+------------+---------+-----------+----------+-------+ Basilic       Full                                           +----------+------------+---------+-----------+----------+-------+ Unable to visualize the left cephalic vein in the upper arm.  Summary:  Right: No evidence of thrombosis in the subclavian.  Left: No evidence of deep vein thrombosis in the upper extremity. Findings consistent with acute superficial vein thrombosis involving the left cephalic vein.  *See table(s) above for measurements and observations.  Diagnosing physician: Monica Martinez MD Electronically signed by Monica Martinez MD on 04/10/2022 at 4:46:11 PM.    Final    IR Fluoro Guide CV Line Right  Result Date: 04/10/2022 INDICATION: 61 year old female referred for tunneled central venous catheter EXAM: IMAGE GUIDED TUNNELED CENTRAL VENOUS CATHETER MEDICATIONS: None ANESTHESIA/SEDATION: Moderate (none FLUOROSCOPY: Radiation Exposure Index (as provided by the fluoroscopic device): 1 mGy Kerma COMPLICATIONS: None PROCEDURE: Informed written consent was obtained from the patient after a discussion of the risks, benefits, and alternatives to treatment. Questions regarding the procedure were encouraged and answered. The right neck and chest were prepped with chlorhexidine in a sterile fashion, and a sterile drape was applied covering the operative field. Maximum barrier sterile technique with sterile gowns and gloves were used for the procedure. A timeout was performed prior to the initiation of the procedure. After written informed consent was obtained, patient was placed in the supine position on angiographic table. Patency of the right external jugular vein was confirmed with ultrasound with image documentation. Patient was prepped and draped in the usual sterile fashion including the right neck and right superior chest. Using ultrasound guidance, the skin and subcutaneous tissues overlying the right external jugular vein were generously infiltrated with 1% lidocaine without epinephrine. Using ultrasound guidance, the vein was  punctured with a micropuncture needle, and an 018 wire was advanced into the right heart confirming venous access. A small stab incision was made with an 11 blade scalpel. Peel-away sheath was placed over  the wire, and then the wire was removed, marking the wire for estimation of internal catheter length. The chest wall was then generously infiltrated with 1% lidocaine for local anesthesia along the tissue tract. Small stab incision was made with 11 blade scalpel, and then the catheter was back tunneled to the puncture site at the right internal jugular vein. Catheter was pulled through the tract, with the catheter amputated at 21 cm. Catheter was advanced through the peel-away sheath, and the peel-away sheath was removed. Final image was stored. The catheter was anchored to the chest wall with 2 retention sutures, and Derma bond was used to seal the right internal jugular vein incision site and at the right chest wall. Patient tolerated the procedure well and remained hemodynamically stable throughout. No complications were encountered and no significant blood loss was encountered. FINDINGS: After catheter placement, the tip lies within the superior cavoatrial junction. The catheter aspirates and flushes normally and is ready for immediate use. IMPRESSION: Status post right external jugular tunneled cuffed central venous catheter. Signed, Dulcy Fanny. Nadene Rubins, RPVI [facility] Electronically Signed   By: Corrie Mckusick D.O.   On: 04/10/2022 15:02   IR US Guide Vasc Access Right  Result Date: 04/10/2022 INDICATION: 61 year old female referred for tunneled central venous catheter EXAM: IMAGE GUIDED TUNNELED CENTRAL VENOUS CATHETER MEDICATIONS: None ANESTHESIA/SEDATION: Moderate (none FLUOROSCOPY: Radiation Exposure Index (as provided by the fluoroscopic device): 1 mGy Kerma COMPLICATIONS: None PROCEDURE: Informed written consent was obtained from the  patient after a discussion of the risks, benefits, and alternatives to treatment. Questions regarding the procedure were encouraged and answered. The right neck and chest were prepped with chlorhexidine in a sterile fashion, and a sterile drape was applied covering the operative field. Maximum barrier sterile technique with sterile gowns and gloves were used for the procedure. A timeout was performed prior to the initiation of the procedure. After written informed consent was obtained, patient was placed in the supine position on angiographic table. Patency of the right external jugular vein was confirmed with ultrasound with image documentation. Patient was prepped and draped in the usual sterile fashion including the right neck and right superior chest. Using ultrasound guidance, the skin and subcutaneous tissues overlying the right external jugular vein were generously infiltrated with 1% lidocaine without epinephrine. Using ultrasound guidance, the vein was punctured with a micropuncture needle, and an 018 wire was advanced into the right heart confirming venous access. A small stab incision was made with an 11 blade scalpel. Peel-away sheath was placed over the wire, and then the wire was removed, marking the wire for estimation of internal catheter length. The chest wall was then generously infiltrated with 1% lidocaine for local anesthesia along the tissue tract. Small stab incision was made with 11 blade scalpel, and then the catheter was back tunneled to the puncture site at the right internal jugular vein. Catheter was pulled through the tract, with the catheter amputated at 21 cm. Catheter was advanced through the peel-away sheath, and the peel-away sheath was removed. Final image was stored. The catheter was anchored to the chest wall with 2 retention sutures, and Derma bond was used to seal the right internal jugular vein incision site and at the right chest wall. Patient tolerated the procedure well  and remained hemodynamically stable throughout. No complications were encountered and no significant blood loss was encountered. FINDINGS: After catheter placement, the tip lies within the superior cavoatrial junction. The catheter aspirates  and flushes normally and is ready for immediate use. IMPRESSION: Status post right external jugular tunneled cuffed central venous catheter. Signed, Dulcy Fanny. Nadene Rubins, RPVI [facility] Electronically Signed   By: Corrie Mckusick D.O.   On: 04/10/2022 15:02    Anti-infectives: Anti-infectives (From admission, onward)    Start     Dose/Rate Route Frequency Ordered Stop   04/03/22 0600  ciprofloxacin (CIPRO) IVPB 400 mg        400 mg 200 mL/hr over 60 Minutes Intravenous On call to O.R. 04/03/22 0550 04/03/22 0850       Assessment/Plan: Secondary hyperparathyroidism -S/P Neck exploration, Left superior parathyroidectomy (normal), Left inferior parathyroidectomy (normal),  Right superior parathyroidectomy (enlarged, probable adenoma), Suture marking of right inferior parathyroid (normal) 6/22 Dr. Harlow Asa - pain controlled and tolerating diet - Ca 7.0 , appreciate Nephrology management of this   ID - cipro periop FEN - renal diet, bowel regimen VTE - SCDs, sq heparin  Foley - none   LUE swelling - u/s negative for DVT, showed SVT. Swelling much improved ESRD - HD MWF HTN HLD CHF - EF 25-30%   LOS: 9 days    Turner Daniels MD  04/12/2022

## 2022-04-13 LAB — CALCIUM
Calcium: 7.6 mg/dL — ABNORMAL LOW (ref 8.9–10.3)
Calcium: 8.3 mg/dL — ABNORMAL LOW (ref 8.9–10.3)

## 2022-04-13 LAB — PHOSPHORUS: Phosphorus: 2.1 mg/dL — ABNORMAL LOW (ref 2.5–4.6)

## 2022-04-13 MED ORDER — CALCITRIOL 0.5 MCG PO CAPS
1.0000 ug | ORAL_CAPSULE | Freq: Once | ORAL | Status: AC
  Administered 2022-04-13: 1 ug via ORAL
  Filled 2022-04-13: qty 2

## 2022-04-13 MED ORDER — CALCIUM CARBONATE ANTACID 1250 MG/5ML PO SUSP
2500.0000 mg | Freq: Three times a day (TID) | ORAL | Status: DC
  Administered 2022-04-14 – 2022-04-15 (×4): 2500 mg via ORAL
  Filled 2022-04-13: qty 25
  Filled 2022-04-13 (×2): qty 10
  Filled 2022-04-13 (×3): qty 25
  Filled 2022-04-13 (×2): qty 10

## 2022-04-13 MED ORDER — CHLORHEXIDINE GLUCONATE CLOTH 2 % EX PADS: 6.0000 | MEDICATED_PAD | Freq: Every day | CUTANEOUS | Status: DC

## 2022-04-13 MED ORDER — CALCIUM CARBONATE ANTACID 1250 MG/5ML PO SUSP
3000.0000 mg | Freq: Once | ORAL | Status: AC
  Administered 2022-04-13: 3000 mg via ORAL
  Filled 2022-04-13: qty 30

## 2022-04-13 MED ORDER — CALCITRIOL 0.5 MCG PO CAPS
4.0000 ug | ORAL_CAPSULE | Freq: Every day | ORAL | Status: DC
  Administered 2022-04-14 – 2022-04-15 (×2): 4 ug via ORAL
  Filled 2022-04-13 (×2): qty 8

## 2022-04-13 MED ORDER — DEXTROSE 5 % IV SOLN
30.0000 mmol | Freq: Once | INTRAVENOUS | Status: AC
  Administered 2022-04-13: 30 mmol via INTRAVENOUS
  Filled 2022-04-13: qty 10

## 2022-04-13 NOTE — Progress Notes (Signed)
10 Days Post-Op   Subjective/Chief Complaint: No complaints   Objective: Vital signs in last 24 hours: Temp:  [98.6 F (37 C)-99 F (37.2 C)] 98.8 F (37.1 C) (07/02 0830) Pulse Rate:  [79-87] 87 (07/02 0830) Resp:  [16-17] 16 (07/02 0830) BP: (100-161)/(57-80) 100/57 (07/02 0830) SpO2:  [100 %] 100 % (07/02 0830) Weight:  [54.7 kg] 54.7 kg (07/01 1222) Last BM Date : 04/12/22  Intake/Output from previous day: 07/01 0701 - 07/02 0700 In: 240 [P.O.:240] Out: -  Intake/Output this shift: No intake/output data recorded.  Incision/Wound: Neck incision clean dry intact.  Lab Results:  No results for input(s): "WBC", "HGB", "HCT", "PLT" in the last 72 hours. BMET Recent Labs    04/11/22 1318 04/11/22 2106 04/12/22 2005 04/13/22 0451  NA 130*  --   --   --   K 3.5  --   --   --   CL 90*  --   --   --   CO2 27  --   --   --   GLUCOSE 105*  --   --   --   BUN 17  --   --   --   CREATININE 7.04*  --   --   --   CALCIUM 6.3*   < > 7.8* 7.6*   < > = values in this interval not displayed.   PT/INR No results for input(s): "LABPROT", "INR" in the last 72 hours. ABG No results for input(s): "PHART", "HCO3" in the last 72 hours.  Invalid input(s): "PCO2", "PO2"  Studies/Results: No results found.  Anti-infectives: Anti-infectives (From admission, onward)    Start     Dose/Rate Route Frequency Ordered Stop   04/03/22 0600  ciprofloxacin (CIPRO) IVPB 400 mg        400 mg 200 mL/hr over 60 Minutes Intravenous On call to O.R. 04/03/22 0550 04/03/22 0850       Assessment/Plan: s/p Procedure(s): NECK EXPLORATION, LEFT SUPERIOR PARATHYROIDECTOMY, LEFT INFERIOR PARATHYROIDECTOMY, RIGHT SUPERIOR PARATHYROIDECTOMY (N/A) Stable  Plan per nephrology    LOS: 10 days    Turner Daniels MD  04/13/2022

## 2022-04-13 NOTE — Progress Notes (Addendum)
[facility] Progress Note   Subjective: Ca++ 7.6 this am. Phos 2.1  Objective Vitals:   04/12/22 1534 04/12/22 2053 04/13/22 0340 04/13/22 0830  BP: (!) 151/80 (!) 161/80 127/66 (!) 100/57  Pulse: 84 82 79 87  Resp: '16 17 16 16  '$ Temp: 99 F (37.2 C) 98.9 F (37.2 C) 98.6 F (37 C) 98.8 F (37.1 C)  TempSrc: Oral Oral Oral Oral  SpO2: 100% 100% 100% 100%  Weight:      Height:       Physical Exam General: Chronically ill appearing female in NAD Heart: Z3,G6 2/6 systolic M. 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 Catalina Antigua. 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 All lobes sent for pathology.  Hypocalcemia -  Hungry bone syndrome s/p parathyroidectomy. Pt has severe case of "hungry bones" syndrome with pre-op PTH peak of > 4000.  She is asymptomatic. We will cont to escalate her po CaCO3 (^2500 mg elemental Ca po tid today, which = 7.5 gm elemental total per day) and vdra (^ rocaltrol to 4.0 ug qd today) until serum Ca is stable > 7.5 off of IV Ca. Serum Ca 7.6 this am. Will not give any IV Ca++ today. Will follow.   Metabolic bone disease - hypocalcemia as noted above.  Phos low as well. Repleted IV phos recently. Binders dc'd. Redose IV Na phos 30 mmoles x 1 today.   ESRD -  MWF HD. Next HD Monday.   HTN/volume  - BP's soft. Lowering BP meds here, will dc hydralazine today and cont metoprolol 12.5 w/ bid dosing. Standing wt was 54.7kg today, up 2.5kg. UF same w/ HD tomorrow.   Anemia  - Last HGB 9.0 04/07/2022. Tsat 40%. Follow labs.   Nutrition - Last albumin 3.9 04/02/2022. H/O FFT. Prescribed Megace at HD unit but wasn't on OP med list. Will resume.   Constipation- aggressive bowel program now. Sennakot A and miralax BID 9.   Possible LUE DVT-LUE completed. Superficial thrombosis of L cephalic vein present. No DVT. Per primary.    Rob Jeymi Hepp  MD 04/13/2022, 12:41 PM  Recent Labs  Lab 04/07/22 0612 04/07/22 0931 04/07/22 1332 04/07/22 1943 04/09/22 1100 04/09/22 1901 04/11/22 1318 04/11/22 2106 04/12/22 0518 04/12/22 1244 04/12/22 2005 04/13/22 0451  HGB 8.7*  --  9.0*  --   --   --   --   --   --   --   --   --   ALBUMIN 2.6*  --  2.8*  --  2.6*  --  2.7*  --   --   --   --   --   CALCIUM 7.9*   < > 8.0*   < > 7.9*   < > 6.3*   < > 7.0*   < > 7.8* 7.6*  PHOS 2.1*  --  2.1*   < > 1.9*   < > 2.2*  --  2.5  --   --  2.1*  CREATININE 9.16*  --  9.52*  --  7.16*  --  7.04*  --   --   --   --   --   K  5.0  --  5.0  --  4.2  --  3.5  --   --   --   --   --    < > = values in this interval not displayed.    Recent Labs  Lab 04/06/22 1253  IRON 77  TIBC 192*    Inpatient medications:  calcitRIOL  1 mcg Oral Once   [START ON 04/14/2022] calcitRIOL  4 mcg Oral Daily   [START ON 04/14/2022] calcium carbonate (dosed in mg elemental calcium)  2,500 mg of elemental calcium Oral TID   calcium carbonate (dosed in mg elemental calcium)  3,000 mg of elemental calcium Oral Once   Chlorhexidine Gluconate Cloth  6 each Topical Q0600   heparin injection (subcutaneous)  5,000 Units Subcutaneous Q8H   hydrALAZINE  10 mg Oral Q8H   isosorbide mononitrate  30 mg Oral Daily   megestrol  400 mg Oral Daily   metoprolol tartrate  12.5 mg Oral BID   polyethylene glycol  17 g Oral BID   senna-docusate  1 tablet Oral BID    sodium chloride 10 mL/hr at 04/10/22 2319   sodium phosphate 30 mmol in dextrose 5 % 250 mL infusion     acetaminophen **OR** acetaminophen, ondansetron **OR** ondansetron (ZOFRAN) IV, mouth rinse, oxyCODONE, sodium chloride flush, traMADol

## 2022-04-13 NOTE — Progress Notes (Signed)
Last Calcium level was 8.3. Pt hopeful for DC tomorrow.

## 2022-04-14 LAB — RENAL FUNCTION PANEL
Albumin: 2.5 g/dL — ABNORMAL LOW (ref 3.5–5.0)
Anion gap: 16 — ABNORMAL HIGH (ref 5–15)
BUN: 38 mg/dL — ABNORMAL HIGH (ref 8–23)
CO2: 28 mmol/L (ref 22–32)
Calcium: 7 mg/dL — ABNORMAL LOW (ref 8.9–10.3)
Chloride: 91 mmol/L — ABNORMAL LOW (ref 98–111)
Creatinine, Ser: 9.09 mg/dL — ABNORMAL HIGH (ref 0.44–1.00)
GFR, Estimated: 5 mL/min — ABNORMAL LOW (ref >60)
Glucose, Bld: 99 mg/dL (ref 70–99)
Phosphorus: 2.8 mg/dL (ref 2.5–4.6)
Potassium: 3.7 mmol/L (ref 3.5–5.1)
Sodium: 135 mmol/L (ref 135–145)

## 2022-04-14 LAB — PHOSPHORUS: Phosphorus: 2.8 mg/dL (ref 2.5–4.6)

## 2022-04-14 MED ORDER — HEPARIN SODIUM (PORCINE) 1000 UNIT/ML DIALYSIS
2000.0000 [IU] | INTRAMUSCULAR | Status: DC | PRN
  Filled 2022-04-14 (×2): qty 2

## 2022-04-14 NOTE — Progress Notes (Signed)
Patient ID: Cynthia Bray, female   DOB: 1961/01/22, 61 y.o.   MRN: [id number]       Assessment & Plan: POD#11 - status post neck exploration and resection of three parathyroid glands             HD today per nephrology   Patient remains stable post op.  Nephrology monitoring labs and repleting calcium. Will check BMET in AM.  If calcium stable and OK with nephrology, will plan discharge home.        Armandina Gemma, [facility] [place] [facility] Office: [phone]        Chief Complaint: Secondary hyperparathyroidism, ESRD on HD  Subjective: Patient up in chair, comfortable, no complaints  Objective: Vital signs in last 24 hours: Temp:  [98.5 F (36.9 C)-99 F (37.2 C)] 98.9 F (37.2 C) (07/03 1731) Pulse Rate:  [70-91] 91 (07/03 1731) Resp:  [11-20] 18 (07/03 1731) BP: (106-157)/(50-82) 106/62 (07/03 1731) SpO2:  [98 %-100 %] 99 % (07/03 1731) Weight:  [55 kg-57 kg] 55 kg (07/03 1220) Last BM Date : 04/13/22  Intake/Output from previous day: 07/02 0701 - 07/03 0700 In: 860 [P.O.:610; IV Piggyback:250] Out: 1 [Stool:1] Intake/Output this shift: No intake/output data recorded.  Physical Exam: HEENT - sclerae clear, mucous membranes moist Neck - wound dry and intact with Dermabond, mild STS, voice normal  Lab Results:  No results for input(s): "WBC", "HGB", "HCT", "PLT" in the last 72 hours. BMET Recent Labs    04/13/22 1317 04/14/22 0248  NA  --  135  K  --  3.7  CL  --  91*  CO2  --  28  GLUCOSE  --  99  BUN  --  38*  CREATININE  --  9.09*  CALCIUM 8.3* 7.0*   PT/INR No results for input(s): "LABPROT", "INR" in the last 72 hours. Comprehensive Metabolic Panel:    Component Value Date/Time   NA 135 04/14/2022 0248   NA 130 (L) 04/11/2022 1318   K 3.7 04/14/2022 0248   K 3.5 04/11/2022 1318   CL 91 (L) 04/14/2022 0248   CL 90 (L) 04/11/2022 1318   CO2 28 04/14/2022 0248   CO2 27 04/11/2022 1318   BUN 38 (H) 04/14/2022 0248    BUN 17 04/11/2022 1318   CREATININE 9.09 (H) 04/14/2022 0248   CREATININE 7.04 (H) 04/11/2022 1318   GLUCOSE 99 04/14/2022 0248   GLUCOSE 105 (H) 04/11/2022 1318   CALCIUM 7.0 (L) 04/14/2022 0248   CALCIUM 8.3 (L) 04/13/2022 1317   AST 19 06/17/2021 1905   ALT 7 06/17/2021 1905   ALKPHOS 227 (H) 06/17/2021 1905   BILITOT 1.2 06/17/2021 1905   PROT 7.3 06/17/2021 1905   ALBUMIN 2.5 (L) 04/14/2022 0248   ALBUMIN 2.7 (L) 04/11/2022 1318    Studies/Results: No results found.    Armandina Gemma 04/14/2022

## 2022-04-14 NOTE — Progress Notes (Signed)
Patient transported to hemodialysis via bed by transportation services.

## 2022-04-14 NOTE — Progress Notes (Signed)
Patients Calcium Carbonate 8am dose was missed due to patient leaving for hemodialysis at 7am. Patient returned to unit and I notified pharmacy that 8 am dose and 12pm doses needed to be sent to unit. They stated they would tube them. 1 doses was sent up and given to patient, the other doses pharmacy stated that it needed to be rescheduled and could not be given too close together. So only one dose was given on my shift due to this.

## 2022-04-14 NOTE — Progress Notes (Signed)
Received patient in bed, alert and oriented. Informed consent signed and in chart.  Time tx completed: 1219  HD treatment completed. Patient tolerated well. Right Arm Fistula without signs and symptoms of complications. Patient eating [place] sandwich with grape juice for lunch. Patient transported back to the room, alert and orient and in no acute distress. Report given to bedside RN.  Total UF removed: 2.5 L  Medication given: n/a  Post HD VS:  Temp 99 BP 120/6 O2 100% RA Resp 18  Post HD weight: 55kg

## 2022-04-14 NOTE — Procedures (Signed)
I was present at this dialysis session. I have reviewed the session itself and made appropriate changes.   Vital signs in last 24 hours:  Temp:  [98.5 F (36.9 C)-98.9 F (37.2 C)] 98.8 F (37.1 C) (07/03 0700) Pulse Rate:  [79-89] 79 (07/03 0700) Resp:  [15-17] 15 (07/03 0700) BP: (100-124)/(50-68) 115/68 (07/03 0700) SpO2:  [98 %-100 %] 100 % (07/03 0700) Weight change:  Filed Weights   04/11/22 1227 04/11/22 1758 04/12/22 1222  Weight: 56.7 kg (P) 55 kg 54.7 kg    Recent Labs  Lab 04/11/22 1318 04/11/22 2106 04/13/22 1317 04/14/22 0248  NA 130*  --   --   --   K 3.5  --   --   --   CL 90*  --   --   --   CO2 27  --   --   --   GLUCOSE 105*  --   --   --   BUN 17  --   --   --   CREATININE 7.04*  --   --   --   CALCIUM 6.3*   < > 8.3*  --   PHOS 2.2*   < >  --  2.8   < > = values in this interval not displayed.    Recent Labs  Lab 04/07/22 1332  WBC 5.0  HGB 9.0*  HCT 28.4*  MCV 88.8  PLT 177    Scheduled Meds:  calcitRIOL  4 mcg Oral Daily   calcium carbonate (dosed in mg elemental calcium)  2,500 mg of elemental calcium Oral TID   Chlorhexidine Gluconate Cloth  6 each Topical Q0600   heparin injection (subcutaneous)  5,000 Units Subcutaneous Q8H   isosorbide mononitrate  30 mg Oral Daily   megestrol  400 mg Oral Daily   metoprolol tartrate  12.5 mg Oral BID   polyethylene glycol  17 g Oral BID   senna-docusate  1 tablet Oral BID   Continuous Infusions:  sodium chloride 10 mL/hr at 04/10/22 2319   PRN Meds:.acetaminophen **OR** acetaminophen, [START ON 04/15/2022] heparin, ondansetron **OR** ondansetron (ZOFRAN) IV, mouth rinse, oxyCODONE, sodium chloride flush, traMADol     Dialysis Orders: [facility] MWF 3:45 hr 160NRe 350/500 52 kg 2.0 K/2.0Ca  high Ca bath at dc 3.0  AVG -Heparin 2000 units IV TIW -Parsabiv 15 mg IV TIW discontinue on discharge      Assessment/Plan:  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 Catalina Bray. R superior gland with  probable adenoma. All lobes sent for pathology.  Hypocalcemia -  Hungry bone syndrome s/p parathyroidectomy. Pt has severe case of "hungry bones" syndrome with pre-op PTH peak of > 4000.  She is asymptomatic. We will cont to escalate her po CaCO3 (^2500 mg elemental Ca po tid today, which = 7.5 gm elemental total per day) and vdra (^ rocaltrol to 4.0 ug qd today) until serum Ca is stable > 7.5 off of IV Ca. Serum Ca 7.6 this am. Will not give any IV Ca++ today. Will follow.   Metabolic bone disease - hypocalcemia as noted above.  Phos low as well. Repleted IV phos recently. Binders dc'd. Redose IV Na phos 30 mmoles x 1 today.   ESRD -  MWF HD. Next HD Monday.   HTN/volume  - BP's soft. Lowering BP meds here, will dc hydralazine today and cont metoprolol 12.5 w/ bid dosing. Standing wt was 54.7kg today, up 2.5kg. UF same w/ HD tomorrow.   Anemia  -  Last HGB 9.0 04/07/2022. Tsat 40%. Follow labs.   Nutrition - Last albumin 3.9 04/02/2022. H/O FFT. Prescribed Megace at HD unit but wasn't on OP med list. Will resume.  Constipation- aggressive bowel program now. Sennakot A and miralax BID 9.   Possible LUE DVT-LUE completed. Superficial thrombosis of L cephalic vein present. No DVT. Per primary.   Phos improved today but unclear why calcium not ordered.  Will recheck today.  If calcium and phos remain stable she can be discharged.    Cynthia Potts,  MD 04/14/2022, 8:00 AM

## 2022-04-15 LAB — BASIC METABOLIC PANEL
Anion gap: 12 (ref 5–15)
BUN: 17 mg/dL (ref 8–23)
CO2: 29 mmol/L (ref 22–32)
Calcium: 7.1 mg/dL — ABNORMAL LOW (ref 8.9–10.3)
Chloride: 95 mmol/L — ABNORMAL LOW (ref 98–111)
Creatinine, Ser: 4.99 mg/dL — ABNORMAL HIGH (ref 0.44–1.00)
GFR, Estimated: 9 mL/min — ABNORMAL LOW (ref >60)
Glucose, Bld: 99 mg/dL (ref 70–99)
Potassium: 3.7 mmol/L (ref 3.5–5.1)
Sodium: 136 mmol/L (ref 135–145)

## 2022-04-15 MED ORDER — CALCIUM CARBONATE ANTACID 500 MG PO CHEW: 5.0000 | CHEWABLE_TABLET | Freq: Three times a day (TID) | ORAL | 3 refills | Status: AC

## 2022-04-15 MED ORDER — CALCITRIOL 0.5 MCG PO CAPS: 4.0000 ug | ORAL_CAPSULE | Freq: Every day | ORAL | 6 refills | Status: DC

## 2022-04-15 NOTE — Progress Notes (Signed)
Discharge instructions given to patient patient verbalized understanding of all teaching.

## 2022-04-15 NOTE — Discharge Instructions (Signed)
[facility] - Dr. Whitney Bingaman  THYROID & PARATHYROID SURGERY:  POST-OP INSTRUCTIONS  Always review the instruction sheet provided by the hospital nurse at discharge.  A prescription for pain medication may be sent to your pharmacy at the time of discharge.  Take your pain medication as prescribed.  If narcotic pain medicine is not needed, then you may take acetaminophen (Tylenol) or ibuprofen (Advil) as needed for pain or soreness.  Take your normal home medications as prescribed unless otherwise directed.  If you need a refill on your pain medication, please contact the office during regular business hours.  Prescriptions will not be processed by the office after 5:00PM or on weekends.  Start with a light diet upon arrival home, such as soup and crackers or toast.  Be sure to drink plenty of fluids.  Resume your normal diet the day after surgery.  Most patients will experience some swelling and bruising on the chest and neck area.  Ice packs will help for the first 48 hours after arriving home.  Swelling and bruising will take several days to resolve.   It is common to experience some constipation after surgery.  Increasing fluid intake and taking a stool softener (Colace) will usually help to prevent this problem.  A mild laxative (Milk of Magnesia or Miralax) should be taken according to package directions if there has been no bowel movement after 48 hours.  Dermabond glue covers your incision. This seals the wound and you may shower at any time. The Dermabond will remain in place for about a week.  You may gradually remove the glue when it loosens around the edges.  If you need to loosen the Dermabond for removal, apply a layer of Vaseline to the wound for 15 minutes and then remove with a Kleenex. Your sutures are under the skin and will not show - they will dissolve on their own.  You may resume light daily activities beginning the day after discharge (such as self-care,  walking, climbing stairs), gradually increasing activities as tolerated. You may have sexual intercourse when it is comfortable. Refrain from any heavy lifting or straining until approved by your doctor. You may drive when you no longer are taking prescription pain medication, you can comfortably wear a seatbelt, and you can safely maneuver your car and apply the brakes.  You will see your doctor in the office for a follow-up appointment approximately three weeks after your surgery.  Make sure that you call for this appointment within a day or two after you arrive home to insure a convenient appointment time. Please have any requested laboratory tests performed a few days prior to your office visit so that the results will be available at your follow up appointment.  WHEN TO CALL THE [facility] OFFICE: -- Fever greater than 101.5 -- Inability to urinate -- Nausea and/or vomiting - persistent -- Extreme swelling or bruising -- Continued bleeding from incision -- Increased pain, redness, or drainage from the incision -- Difficulty swallowing or breathing -- Muscle cramping or spasms -- Numbness or tingling in hands or around lips  The clinic staff is available to answer your questions during regular business hours.  Please don't hesitate to call and ask to speak to one of the nurses if you have concerns.  [facility] OFFICE: [phone] (24 hours)  Please sign up for MyChart accounts. This will allow you to communicate directly with my nurse or myself without having to call the office. It will also allow you   to view your test results. You will need to enroll in MyChart for my office ([facility]) and for the hospital ([facility]).  Lakhia Gengler, MD [facility] A [facility] practice 

## 2022-04-15 NOTE — Progress Notes (Signed)
Per Brooke Bonito ok to go home with IJ and they will make an appointment for it to be removed with radiology. Patient discharged to home with son via wheelchair with all belongings.

## 2022-04-15 NOTE — Progress Notes (Signed)
IVT consulted for IJ discontinue.  RN, Jasmin advised a CL discontinue order will need to be placed.

## 2022-04-15 NOTE — Progress Notes (Signed)
Discharge medications were sent to [place] per Vita Erm

## 2022-04-15 NOTE — Discharge Summary (Signed)
Physician Discharge Summary   Patient ID: Cynthia Bray MRN: [id number] DOB/AGE: [birth year] 61 y.o.  Admit date: 04/03/2022  Discharge date: 04/15/2022  Discharge Diagnoses:  Principal Problem:   Secondary hyperparathyroidism ([place]) Active Problems:   ESRD (end stage renal disease) ([place])   Hyperparathyroidism ([place])   Secondary hyperparathyroidism of renal origin [facility])   Discharged Condition: good  Hospital Course: Patient was admitted for observation following parathyroid surgery.  Post op course was complicated by "hungry bones" syndrome, requiring aggressive repletion of calcium and vitamin D by nephrology, and use of a high calcium bath with dialysis.  Pain was well controlled.  Tolerated diet.  Patient was prepared for discharge home on POD#12.  Consults: nephrology  Treatments: surgery: subtotal parathyroidectomy  Discharge Exam: Blood pressure (!) 165/98, pulse 92, temperature 98.9 F (37.2 C), temperature source Oral, resp. rate 16, height '5\' 8"'$  (1.727 m), weight 55 kg, SpO2 100 %. HEENT - clear Neck - wound dry and intact; voice normal; minimal soft tissue swelling  Disposition: Home  Discharge Instructions     Diet - low sodium heart healthy   Complete by: As directed    Increase activity slowly   Complete by: As directed    No dressing needed   Complete by: As directed       Allergies as of 04/15/2022       Reactions   Sensipar [cinacalcet] Nausea And Vomiting   Aplisol [tuberculin, Ppd] Swelling   Lisinopril Swelling   Angioedema   Penicillin G Swelling        Medication List     TAKE these medications    aspirin EC 81 MG tablet Take 81 mg by mouth daily. Swallow whole.   atorvastatin 40 MG tablet Commonly known as: LIPITOR Take 40 mg by mouth every evening.   benzonatate 100 MG capsule Commonly known as: TESSALON Take 1 capsule (100 mg total) by mouth every 8 (eight) hours. What changed:  when to take this reasons to take this    diclofenac Sodium 1 % Gel Commonly known as: VOLTAREN Apply 4 g topically 4 (four) times daily. What changed:  when to take this reasons to take this   hydrALAZINE 25 MG tablet Commonly known as: APRESOLINE Take 1 tablet (25 mg total) by mouth every 8 (eight) hours.   ibuprofen 600 MG tablet Commonly known as: ADVIL Take 1 tablet (600 mg total) by mouth every 6 (six) hours as needed.   isosorbide mononitrate 30 MG 24 hr tablet Commonly known as: IMDUR Take 1 tablet (30 mg total) by mouth daily.   lidocaine-prilocaine cream Commonly known as: EMLA Apply 1 application topically See admin instructions. Apply small amount to access site 1-2 hours before dialysis.   metoprolol succinate 100 MG 24 hr tablet Commonly known as: TOPROL-XL Take 1 tablet (100 mg total) by mouth daily. Take with or immediately following a meal.   multivitamin Tabs tablet Take 1 tablet by mouth at bedtime.   sucroferric oxyhydroxide 500 MG chewable tablet Commonly known as: VELPHORO Chew 500 mg by mouth 3 (three) times daily with meals.               Discharge Care Instructions  (From admission, onward)           Start     Ordered   04/15/22 0000  No dressing needed        04/15/22 1037            Follow-up Information  Armandina Gemma, MD Follow up.   Specialty: General Surgery Contact information: [address] [place] [address] [phone]                 Yashika Mask, [facility] Office: [phone]   Signed: Armandina Gemma 04/15/2022, 10:41 AM

## 2022-04-15 NOTE — Progress Notes (Signed)
12 Days Post-Op   Subjective/Chief Complaint: Patient has no complaints   Objective: Vital signs in last 24 hours: Temp:  [98.3 F (36.8 C)-99 F (37.2 C)] 98.9 F (37.2 C) (07/04 0752) Pulse Rate:  [70-92] 92 (07/04 0752) Resp:  [11-20] 16 (07/04 0752) BP: (106-165)/(60-98) 165/98 (07/04 0752) SpO2:  [99 %-100 %] 100 % (07/04 0752) Weight:  [55 kg] 55 kg (07/03 1220) Last BM Date : 04/14/22  Intake/Output from previous day: No intake/output data recorded. Intake/Output this shift: No intake/output data recorded.   Neck incision clean dry intact Lab Results:  No results for input(s): "WBC", "HGB", "HCT", "PLT" in the last 72 hours. BMET Recent Labs    04/14/22 0248 04/15/22 0253  NA 135 136  K 3.7 3.7  CL 91* 95*  CO2 28 29  GLUCOSE 99 99  BUN 38* 17  CREATININE 9.09* 4.99*  CALCIUM 7.0* 7.1*   PT/INR No results for input(s): "LABPROT", "INR" in the last 72 hours. ABG No results for input(s): "PHART", "HCO3" in the last 72 hours.  Invalid input(s): "PCO2", "PO2"  Studies/Results: No results found.  Anti-infectives: Anti-infectives (From admission, onward)    Start     Dose/Rate Route Frequency Ordered Stop   04/03/22 0600  ciprofloxacin (CIPRO) IVPB 400 mg        400 mg 200 mL/hr over 60 Minutes Intravenous On call to O.R. 04/03/22 0550 04/03/22 0850       Assessment/Plan: s/p Procedure(s): NECK EXPLORATION, LEFT SUPERIOR PARATHYROIDECTOMY, LEFT INFERIOR PARATHYROIDECTOMY, RIGHT SUPERIOR PARATHYROIDECTOMY (N/A) Stable surgically  Discharge once cleared by nephrology  Calcium 7.0 today  LOS: 12 days    Marcello Moores A Krysten Veronica 04/15/2022

## 2022-04-15 NOTE — Progress Notes (Signed)
[facility] Progress Note   Subjective:  Seen in room, sitting in recliner. Feels well. No CP/dyspnea. No neck pain. No perioral numbness or muscle spasms. CorrCa stable - fine for discharge from renal standpoint.  Objective Vitals:   04/14/22 1731 04/14/22 2106 04/15/22 0617 04/15/22 0752  BP: 106/62 109/65 127/71 (!) 165/98  Pulse: 91 87 91 92  Resp: '18 15 16 16  '$ Temp: 98.9 F (37.2 C) 98.3 F (36.8 C) 99 F (37.2 C) 98.9 F (37.2 C)  TempSrc: Oral Oral Oral Oral  SpO2: 99% 100% 99% 100%  Weight:      Height:       Physical Exam General: Well appearing woman, NAD. Room air. Heart: RRR; no murmur  Lungs: CTA anteriorly Abdomen: soft Extremities: No LE edema Dialysis Access: AVG  Additional Objective Labs: Basic Metabolic Panel: Recent Labs  Lab 04/11/22 1318 04/11/22 2106 04/12/22 0518 04/12/22 1244 04/13/22 0451 04/13/22 1317 04/14/22 0248 04/15/22 0253  NA 130*  --   --   --   --   --  135 136  K 3.5  --   --   --   --   --  3.7 3.7  CL 90*  --   --   --   --   --  91* 95*  CO2 27  --   --   --   --   --  28 29  GLUCOSE 105*  --   --   --   --   --  99 99  BUN 17  --   --   --   --   --  38* 17  CREATININE 7.04*  --   --   --   --   --  9.09* 4.99*  CALCIUM 6.3*   < > 7.0*   < > 7.6* 8.3* 7.0* 7.1*  PHOS 2.2*  --  2.5  --  2.1*  --  2.8  2.8  --    < > = values in this interval not displayed.   Liver Function Tests: Recent Labs  Lab 04/09/22 1100 04/11/22 1318 04/14/22 0248  ALBUMIN 2.6* 2.7* 2.5*   Medications:  sodium chloride 10 mL/hr at 04/10/22 2319    calcitRIOL  4 mcg Oral Daily   calcium carbonate (dosed in mg elemental calcium)  2,500 mg of elemental calcium Oral TID   Chlorhexidine Gluconate Cloth  6 each Topical Q0600   heparin injection (subcutaneous)  5,000 Units Subcutaneous Q8H   isosorbide mononitrate  30 mg Oral Daily   megestrol  400 mg Oral Daily   metoprolol tartrate  12.5 mg Oral BID   polyethylene glycol   17 g Oral BID   senna-docusate  1 tablet Oral BID    Dialysis Orders: MWF at [facility] 3:45hr, 350/500, EDW 52kg, 2K/2Ca, AVG, heparin 2000u bolus -> changing to ^ Ca bath. Hermina Staggers '15mg'$  q HD -> discontinued  Assessment/Plan: 1. Severe secondary HPTH: S/p parathyroidectomy 04/03/22, path benign. Severe hungry bone syndrome afterwards, requiring multiple IV Ca doses. Has been on PO calcitriol and Tums with doses being titrated up. Last given IV Ca on 7/1 - Ca reasonable stable with CORRECTED Ca over 8. She is asymptomatic. [place] for discharge from renal standpoint -> meds below, explained importance of taking.  - Calcitriol 49mg PO daily (four 145m tabs)  - CaCO3 '2500mg'$  PO TID (5 Tums TID) - explained to take AWAY from meals.  - Will also use high Ca  bath with dialysis.  - Will monitor Ca, Phos, Alb regularly with dialysis. 2. ESRD: Continue HD on usual MWF schedule - HD tomorrow, either here or as outpatient. 3. HTN/volume: BP had been low here, hydralazine was stopped. Looks like EDW will need to be raised. 4. Anemia of ESRD: Last Hgb 9 - resume low dose ESA on discharge. 5. Nutrition: Alb low, on megace for appetite, needs protein supplements. 6. Constipation: Improved with bowel regimen. 7. Superficial thrombosis of L cephalic vein, no DVT.   Veneta Penton, PA-C 04/15/2022, 9:58 AM  [employer]

## 2022-04-16 ENCOUNTER — Telehealth (HOSPITAL_COMMUNITY): Payer: Self-pay | Admitting: Nephrology

## 2022-04-16 NOTE — Telephone Encounter (Signed)
Transition of care contact from inpatient facility  Date of Discharge: 04/15/22 Date of Contact: 04/16/22 Method of contact: Phone  Attempted to contact patient to discuss transition of care from inpatient admission. Patient did not answer the phone. Message was left on the patient's voicemail with call back number [phone].  Veneta Penton, PA-C [employer] Pager [phone]

## 2022-04-16 NOTE — Telephone Encounter (Signed)
Transition of care contact from inpatient facility  Date of discharge: 04/15/22 Date of contact: 04/16/22 Method: Phone Spoke to: Patient's son -- returned my call  Patient contacted to discuss transition of care from recent inpatient hospitalization. Patient was admitted to [facility] from 6/22 - 04/15/22 with discharge diagnosis of severe hungry bone syndrome s/p parathyroidectomy.  She did not go to dialysis today - was tired. She will go tomorrow (as makeup), then again on Fri.  Medication changes were reviewed. Son reports calcitriol had to be ordered - [facility] did not have. We can give her oral calcitriol from her HD unit supply until they are able to get it. Also, son reports that pharmacy swapped her tums from '500mg'$  to '1000mg'$  and that she only needs 1 TID? If on higher dose, should take 2.5 TID. He was insistent pharmacist said 1 TID -> to bring the bottle to dialysis tomorrow to verify. Spoke to dietician and charge RN at the HD clinic to verify plan.  Patient will follow up with his/her outpatient HD unit on: tomorrow.  Veneta Penton, PA-C [employer] Pager [phone]

## 2022-04-17 ENCOUNTER — Telehealth (HOSPITAL_COMMUNITY): Payer: Self-pay

## 2022-04-17 ENCOUNTER — Other Ambulatory Visit (HOSPITAL_COMMUNITY): Payer: Self-pay | Admitting: Nephrology

## 2022-04-17 ENCOUNTER — Other Ambulatory Visit (HOSPITAL_COMMUNITY): Payer: Self-pay | Admitting: Interventional Radiology

## 2022-04-17 DIAGNOSIS — N186 End stage renal disease: Secondary | ICD-10-CM

## 2022-04-17 NOTE — Telephone Encounter (Signed)
Called to schedule cvc removal, no answer, left vm. AW

## 2022-04-22 ENCOUNTER — Ambulatory Visit (HOSPITAL_COMMUNITY): Admission: RE | Admit: 2022-04-22 | Payer: Medicare Other | Source: Ambulatory Visit

## 2022-04-22 ENCOUNTER — Ambulatory Visit (HOSPITAL_COMMUNITY): Payer: Medicare Other

## 2022-04-22 ENCOUNTER — Encounter (HOSPITAL_COMMUNITY): Payer: Self-pay

## 2022-04-29 ENCOUNTER — Ambulatory Visit (HOSPITAL_COMMUNITY)
Admission: RE | Admit: 2022-04-29 | Discharge: 2022-04-29 | Disposition: A | Discharge status: Home / Self Care | Payer: Medicare Other | Source: Ambulatory Visit | Attending: Nephrology | Admitting: Nephrology

## 2022-04-29 DIAGNOSIS — N186 End stage renal disease: Secondary | ICD-10-CM | POA: Diagnosis not present

## 2022-04-29 DIAGNOSIS — Z4901 Encounter for fitting and adjustment of extracorporeal dialysis catheter: Secondary | ICD-10-CM | POA: Diagnosis not present

## 2022-04-29 HISTORY — PX: IR REMOVAL TUN CV CATH W/O FL: IMG2289

## 2022-04-29 MED ORDER — LIDOCAINE HCL 1 % IJ SOLN
INTRAMUSCULAR | Status: AC
  Administered 2022-04-29: 10 mL
  Filled 2022-04-29: qty 20

## 2022-04-29 NOTE — Procedures (Signed)
Interventional Radiology Procedure Note  Risks and benefits of removal of hemodialysis catheter were discussed with the patient including, but not limited to bleeding, hematoma, infection, damage to adjacent structures.  All of the patient's questions were answered, patient is agreeable to proceed. Consent signed and in chart. PROCEDURE SUMMARY:  Successful removal of tunneled dialysis catheter.  No complications.   EBL = trace  Please see full dictation in imaging section of Epic for procedure details.  Jacqualine Mau NP 04/29/2022 12:01 PM

## 2022-05-07 NOTE — Progress Notes (Deleted)
  Subjective:    Cynthia Bray - 61 y.o. female MRN [id number]  Date of birth: 06/11/1961  HPI  [facility] Shimp is to establish care.  Current issues and/or concerns: Cards - cardiomyopathy, CHF, HTN, HLD  ESRD on HD - Nephrology  Hyperparathyroid - General Surgery     ROS per HPI     Health Maintenance:  Health Maintenance Due  Topic Date Due   TETANUS/TDAP  Never done   PAP SMEAR-Modifier  Never done   COLONOSCOPY (Pts 45-53yr Insurance coverage will need to be confirmed)  Never done   MAMMOGRAM  Never done   Zoster Vaccines- Shingrix (1 of 2) Never done   COVID-19 Vaccine (3 - Moderna series) 02/17/2020     Past Medical History: Patient Active Problem List   Diagnosis Date Noted   [place]roidism ([place] 04/03/2022   Secondary [place]roidism of renal origin ([place] 04/03/2022   NICM (nonischemic cardiomyopathy) ([place] 02/20/2022   Angioedema due to angiotensin converting enzyme inhibitor (ACE-I) 02/20/2022   Hyperlipidemia with target LDL less than 70 02/20/2022   Secondary hyperparathyroidism ([place] 02/20/2022   Protein-calorie malnutrition, severe 05/09/2021   Pulmonary edema 070/17/7939  Chronic systolic heart failure ([place] 05/07/2021   ESRD (end stage renal disease) ([place] 05/07/2021   Hypercalcemia 05/07/2021   HTN (hypertension) 05/07/2021   Tachycardia 05/07/2021      Social History   reports that she has never smoked. She has never used smokeless tobacco. She reports that she does not drink alcohol and does not use drugs.   Family History  family history includes Kidney disease in her mother.   Medications: reviewed and updated   Objective:   Physical Exam There were no vitals taken for this visit. Physical Exam      Assessment & Plan:         Patient was given clear instructions to go to Emergency Department or return to medical center if symptoms don't improve, worsen, or new problems develop.The patient verbalized  understanding.  I discussed the assessment and treatment plan with the patient. The patient was provided an opportunity to ask questions and all were answered. The patient agreed with the plan and demonstrated an understanding of the instructions.   The patient was advised to call back or seek an in-person evaluation if the symptoms worsen or if the condition fails to improve as anticipated.    ADurene Fruits NP 05/07/2022, 12:26 PM [facility]

## 2022-05-15 ENCOUNTER — Ambulatory Visit: Payer: Medicare Other | Admitting: Family

## 2022-05-15 DIAGNOSIS — Z7689 Persons encountering health services in other specified circumstances: Secondary | ICD-10-CM

## 2022-05-22 ENCOUNTER — Ambulatory Visit (HOSPITAL_BASED_OUTPATIENT_CLINIC_OR_DEPARTMENT_OTHER): Payer: Medicare Other | Admitting: Cardiovascular Disease

## 2022-07-01 ENCOUNTER — Ambulatory Visit (INDEPENDENT_AMBULATORY_CARE_PROVIDER_SITE_OTHER): Payer: Medicare Other | Admitting: Cardiovascular Disease

## 2022-07-01 ENCOUNTER — Encounter (HOSPITAL_BASED_OUTPATIENT_CLINIC_OR_DEPARTMENT_OTHER): Payer: Self-pay | Admitting: Cardiovascular Disease

## 2022-07-01 VITALS — BP 133/86 | HR 80 | Ht 68.0 in | Wt 126.3 lb

## 2022-07-01 DIAGNOSIS — N186 End stage renal disease: Secondary | ICD-10-CM

## 2022-07-01 DIAGNOSIS — I151 Hypertension secondary to other renal disorders: Secondary | ICD-10-CM | POA: Diagnosis not present

## 2022-07-01 DIAGNOSIS — I5022 Chronic systolic (congestive) heart failure: Secondary | ICD-10-CM | POA: Diagnosis not present

## 2022-07-01 DIAGNOSIS — E785 Hyperlipidemia, unspecified: Secondary | ICD-10-CM | POA: Diagnosis not present

## 2022-07-01 DIAGNOSIS — N2889 Other specified disorders of kidney and ureter: Secondary | ICD-10-CM

## 2022-07-01 DIAGNOSIS — I428 Other cardiomyopathies: Secondary | ICD-10-CM | POA: Diagnosis not present

## 2022-07-01 NOTE — Assessment & Plan Note (Signed)
She has no recent lipids on file.  Continue atorvastatin.  We will get fasting lipids drawn with hemodialysis if possible.  If not she will come back for fasting lipids.

## 2022-07-01 NOTE — Assessment & Plan Note (Signed)
Blood pressure stable on metoprolol and Imdur.  No changes at this time.

## 2022-07-01 NOTE — Assessment & Plan Note (Addendum)
Nonischemic cardiomyopathy.  She reports having a negative stress test in the past.  She is euvolemic and doing well.  She has chronic dyspnea that is unchanged from baseline.  Volume status is managed with dialysis.  Blood pressure is well controlled.  Continue metoprolol and Imdur.  No ARB due to allergy and ESRD on HD.

## 2022-07-01 NOTE — Progress Notes (Signed)
Cardiology Office Note:    Date:  07/02/2022   ID:  Cynthia Bray, DOB [birth year], MRN [id number]  PCP:  Pcp, No   [facility] Providers Cardiologist:  None     Referring MD: No ref. provider found    History of Present Illness:    Cynthia Bray is a 61 y.o. female with ESRD, hypertension, chronic systolic and diastolic heart failure who is here for follow up. She was seen in the hospital 04/2021 for a heart failure exacerbation. LVEF on admission 25-30% with mild LVH and grade 3 diastolic dysfunction. She had mild to moderate MR and a small pericardial effusion. At that admission additional fluid was removed with dialysis. She was noted to be very frail and had failure to thrive. She last saw Cynthia Bray, [place] on 02/2022 and was stable. She was diagnosed with secondary hyperparathyroidism and underwent parathyroidectomy 04/2022.   She is accompanied by her son today. She is doing well overall today. For exercise she walks everyday for about 30 minutes to and hour. At times she feels short of breath and fatigued while she is walking. It has been going on for about a month, but has been pretty stable overall.  She denies any chest pain or pressure. She goes for dialysis three times a week at [facility] (Monday Wednesday and Friday) and is tolerating it well. She denies any issues with low or high blood pressures during dialysis. She is not sure what her blood pressure is like at home. She was first told that her heart function was weak in 2014. At that time she had a stress test that was normal. Her mother and her sister both have heart disease. Her mother passed from a heart attack and her sister has a defibrillator. She denies any palpitations, chest pain, or peripheral edema. No lightheadedness, headaches, syncope, orthopnea, or PND.     Past Medical History:  Diagnosis Date   CHF (congestive heart failure) (HCC)    EF 25%   ESRD (end stage renal disease) (HCC)    GERD  (gastroesophageal reflux disease)    Hypertension    Hyperthyroidism    Renal disorder     Past Surgical History:  Procedure Laterality Date   AV FISTULA PLACEMENT     CARDIAC CATHETERIZATION     IR FLUORO GUIDE CV LINE RIGHT  04/10/2022   IR REMOVAL TUN CV CATH W/O FL  04/29/2022   IR US GUIDE VASC ACCESS RIGHT  04/10/2022   PARATHYROIDECTOMY N/A 04/03/2022   Procedure: NECK EXPLORATION, LEFT SUPERIOR PARATHYROIDECTOMY, LEFT INFERIOR PARATHYROIDECTOMY, RIGHT SUPERIOR PARATHYROIDECTOMY;  Surgeon: Cynthia Gemma, MD;  Location: [facility] OR;  Service: General;  Laterality: N/A;   VASCULAR SURGERY     fistula bilaterally. left arm removed    Current Medications: Current Meds  Medication Sig   aspirin EC 81 MG tablet Take 81 mg by mouth daily. Swallow whole.   atorvastatin (LIPITOR) 40 MG tablet Take 40 mg by mouth every evening.   benzonatate (TESSALON) 100 MG capsule Take 1 capsule (100 mg total) by mouth every 8 (eight) hours. (Patient taking differently: Take 100 mg by mouth 3 (three) times daily as needed for cough.)   calcitRIOL (ROCALTROL) 0.5 MCG capsule Take 8 capsules (4 mcg total) by mouth daily.   calcium carbonate (TUMS) 500 MG chewable tablet Chew 5 tablets (1,000 mg of elemental calcium total) by mouth 3 (three) times daily. Take 5 tabs three times daily between meals   diclofenac Sodium (VOLTAREN)  1 % GEL Apply 4 g topically 4 (four) times daily. (Patient taking differently: Apply 4 g topically 4 (four) times daily as needed (pain).)   isosorbide mononitrate (IMDUR) 30 MG 24 hr tablet Take 1 tablet (30 mg total) by mouth daily.   lidocaine-prilocaine (EMLA) cream Apply 1 application topically See admin instructions. Apply small amount to access site 1-2 hours before dialysis.   metoprolol succinate (TOPROL-XL) 100 MG 24 hr tablet Take 1 tablet (100 mg total) by mouth daily. Take with or immediately following a meal.   multivitamin (RENA-VIT) TABS tablet Take 1 tablet by mouth at  bedtime.     Allergies:   Sensipar [cinacalcet]; Aplisol [tuberculin, ppd]; Lisinopril; and Penicillin g   Social History   Socioeconomic History   Marital status: Widowed    Spouse name: Not on file   Number of children: Not on file   Years of education: Not on file   Highest education level: Not on file  Occupational History   Not on file  Tobacco Use   Smoking status: Never   Smokeless tobacco: Never  Vaping Use   Vaping Use: Never used  Substance and Sexual Activity   Alcohol use: Never   Drug use: Never   Sexual activity: Not on file  Other Topics Concern   Not on file  Social History Narrative   Not on file   Social Determinants of Health   Financial Resource Strain: Not on file  Food Insecurity: Not on file  Transportation Needs: Not on file  Physical Activity: Not on file  Stress: Not on file  Social Connections: Not on file     Family History: The patient's family history includes Heart attack in her sister; Heart failure in her sister; Kidney disease in her mother.  ROS:   Please see the history of present illness.    (+) shortness of breath on exertion (+) fatigue  All other systems reviewed and are negative.  EKGs/Labs/Other Studies Reviewed:    The following studies were reviewed today:  Echo  05/08/2021:  1. Left ventricular ejection fraction, by estimation, is 25 to 30%. The  left ventricle has severely decreased function. The left ventricle  demonstrates global hypokinesis. There is mild left ventricular  hypertrophy. Left ventricular diastolic parameters   are consistent with Grade III diastolic dysfunction (restrictive).  Elevated left atrial pressure.   2. Right ventricular systolic function is normal. The right ventricular  size is mildly enlarged. There is moderately elevated pulmonary artery  systolic pressure. The estimated right ventricular systolic pressure is  44.0 mmHg.   3. Left atrial size was moderately dilated.   4. Right  atrial size was mildly dilated.   5. A small pericardial effusion is present.   6. The mitral valve is normal in structure. Mild to moderate mitral valve  regurgitation.   7. Tricuspid valve regurgitation is mild to moderate.   8. The aortic valve is tricuspid. Aortic valve regurgitation is not  visualized. Mild aortic valve sclerosis is present, with no evidence of  aortic valve stenosis.   9. The inferior vena cava is dilated in size with >50% respiratory  variability, suggesting right atrial pressure of 8 mmHg.   EKG: EKG is personally reviewed.  07/01/2022: EKG was not ordered.   Recent Labs: 04/07/2022: Hemoglobin 9.0; Platelets 177 04/09/2022: Magnesium 1.9 04/15/2022: BUN 17; Creatinine, Ser 4.99; Potassium 3.7; Sodium 136  Recent Lipid Panel No results found for: "CHOL", "TRIG", "HDL", "CHOLHDL", "VLDL", "LDLCALC", "LDLDIRECT"  Risk Assessment/Calculations:                Physical Exam:    VS:  BP 133/86 (BP Location: Left Arm, Patient Position: Sitting, Cuff Size: Normal)   Pulse 80   Ht '5\' 8"'$  (1.727 m)   Wt 126 lb 4.8 oz (57.3 kg)   BMI 19.20 kg/m  , BMI Body mass index is 19.2 kg/m. GENERAL:  Well appearing HEENT: Pupils equal round and reactive, fundi not visualized, oral mucosa unremarkable NECK:  No jugular venous distention, waveform within normal limits, carotid upstroke brisk and symmetric, no bruits, no thyromegaly LUNGS:  Clear to auscultation bilaterally HEART:  RRR.  PMI not displaced or sustained,S1 and S2 within normal limits, no S3, no S4, no clicks, no rubs, no murmurs ABD:  Flat, positive bowel sounds normal in frequency in pitch, no bruits, no rebound, no guarding, no midline pulsatile mass, no hepatomegaly, no splenomegaly EXT:  2 plus pulses throughout, no edema, no cyanosis no clubbing SKIN:  No rashes no nodules NEURO:  Cranial nerves II through XII grossly intact, motor grossly intact throughout PSYCH:  Cognitively intact, oriented to person  place and time   ASSESSMENT:    1. Hyperlipidemia with target LDL less than 70   2. Hypertension secondary to other renal disorders   3. Chronic systolic heart failure ([place])   4. NICM (nonischemic cardiomyopathy) ([place])   5. ESRD (end stage renal disease) ([place])    PLAN:    Chronic systolic heart failure (HCC) Nonischemic cardiomyopathy.  She reports having a negative stress test in the past.  She is euvolemic and doing well.  She has chronic dyspnea that is unchanged from baseline.  Volume status is managed with dialysis.  Blood pressure is well controlled.  Continue metoprolol and Imdur.  No ARB due to allergy and ESRD on HD.  HTN (hypertension) Blood pressure stable on metoprolol and Imdur.  No changes at this time.  Hyperlipidemia with target LDL less than 70 She has no recent lipids on file.  Continue atorvastatin.  We will get fasting lipids drawn with hemodialysis if possible.  If not she will come back for fasting lipids.   Disposition: FU with Rowdy Guerrini C. Oval Linsey, MD, [facility] in 6 months  Medication Adjustments/Labs and Tests Ordered: Current medicines are reviewed at length with the patient today.  Concerns regarding medicines are outlined above.  Orders Placed This Encounter  Procedures   Lipid panel   No orders of the defined types were placed in this encounter.   Patient Instructions  Medication Instructions:  Your physician recommends that you continue on your current medications as directed. Please refer to the Current Medication list given to you today.   *If you need a refill on your cardiac medications before your next appointment, please call your pharmacy*  Lab Work: [place]   If you have labs (blood work) drawn today and your tests are completely normal, you will receive your results only by: [place] (if you have MyChart) OR A paper copy in the mail If you have any lab test that is abnormal or we need to change your treatment, we will  call you to review the results.  Testing/Procedures: NONE  Follow-Up: At [facility], you and your health needs are our priority.  As part of our continuing mission to provide you with exceptional heart care, we have created designated Provider Care Teams.  These Care Teams include your primary Cardiologist (physician) and Advanced  Practice Providers (APPs -  Physician Assistants and Nurse Practitioners) who all work together to provide you with the care you need, when you need it.  We recommend signing up for the patient portal called "MyChart".  Sign up information is provided on this After Visit Summary.  MyChart is used to connect with patients for Virtual Visits (Telemedicine).  Patients are able to view lab/test results, encounter notes, upcoming appointments, etc.  Non-urgent messages can be sent to your provider as well.   To learn more about what you can do with MyChart, go to [email or web address].    Your next appointment:   6 month(s)  The format for your next appointment:   In Person  Provider:   Skeet Latch, MD      I, Yaritzy Huser C. Oval Linsey, MD have reviewed all documentation for this visit.  The documentation of the exam, diagnosis, procedures, and orders on 07/02/2022 are all accurate and complete.   Signed, Skeet Latch, MD  07/02/2022 9:08 PM    [place]

## 2022-07-01 NOTE — Patient Instructions (Signed)
Medication Instructions:  Your physician recommends that you continue on your current medications as directed. Please refer to the Current Medication list given to you today.   *If you need a refill on your cardiac medications before your next appointment, please call your pharmacy*  Lab Work: [place]   If you have labs (blood work) drawn today and your tests are completely normal, you will receive your results only by: [place] (if you have MyChart) OR A paper copy in the mail If you have any lab test that is abnormal or we need to change your treatment, we will call you to review the results.  Testing/Procedures: NONE  Follow-Up: At [facility], you and your health needs are our priority.  As part of our continuing mission to provide you with exceptional heart care, we have created designated Provider Care Teams.  These Care Teams include your primary Cardiologist (physician) and Advanced Practice Providers (APPs -  Physician Assistants and Nurse Practitioners) who all work together to provide you with the care you need, when you need it.  We recommend signing up for the patient portal called "MyChart".  Sign up information is provided on this After Visit Summary.  MyChart is used to connect with patients for Virtual Visits (Telemedicine).  Patients are able to view lab/test results, encounter notes, upcoming appointments, etc.  Non-urgent messages can be sent to your provider as well.   To learn more about what you can do with MyChart, go to [email or web address].    Your next appointment:   6 month(s)  The format for your next appointment:   In Person  Provider:   Skeet Latch, MD

## 2022-07-02 ENCOUNTER — Encounter (HOSPITAL_BASED_OUTPATIENT_CLINIC_OR_DEPARTMENT_OTHER): Payer: Self-pay | Admitting: Cardiovascular Disease

## 2022-07-23 NOTE — Progress Notes (Signed)
Subjective:    Cynthia Bray - 61 y.o. female MRN [id number]  Date of birth: 11-14-1960  HPI  Cynthia Bray is a 61 year-old female to establish care. She is accompanied by her son [name].   Current issues and/or concerns: Established with Cardiology for management of hypertension, hyperlipidemia, congestive heart failure, cardiomyopathy, and end-stage renal disease. Patient's son plans to contact patient's cardiologist Skeet Latch, MD for Metoprolol refills. Patient goes to dialysis thrice weekly on Monday, Wednesday, and Friday. Patient's son states he is unsure of the name of the dialysis center patient attends. Reports what he does know is that the dialysis center is on [address] in [place]. Today patient states she is dizzy and requesting prescription for Meclizine.   ROS per HPI   Health Maintenance:  Health Maintenance Due  Topic Date Due   PAP SMEAR-Modifier  Never done   COLONOSCOPY (Pts 45-57yr Insurance coverage will need to be confirmed)  Never done   MAMMOGRAM  Never done   COVID-19 Vaccine (3 - Moderna series) 02/17/2020    Past Medical History: Patient Active Problem List   Diagnosis Date Noted   Hyperparathyroidism ([place] 04/03/2022   Angioedema due to angiotensin converting enzyme inhibitor (ACE-I) 02/20/2022   Hyperlipidemia with target LDL less than 70 02/20/2022   Secondary [place]parathyroidism ([place] 02/20/2022   Pain, unspecified 01/08/2022   Encounter for immunization 07/11/2021   LV dysfunction 06/19/2021   Hypokalemia 06/03/2021   Iron deficiency anemia, unspecified 06/03/2021   Pulmonary edema 05/09/2021   Hypercalcemia 05/07/2021   HTN (hypertension) 05/07/2021   Tachycardia 05/07/2021   Mild protein-calorie malnutrition ([place] 04/30/2021   Secondary hyperparathyroidism of renal origin ([place] 04/26/2021   Respiratory failure with hypoxia ([place] 04/25/2021   Heart failure, unspecified ([place] 01/14/2021   Fluid overload  12/28/2020   Hypomagnesemia 10/31/2020   Acute on chronic systolic heart failure ([place] 10/30/2020   Community acquired pneumonia of right upper lobe of lung 10/30/2020   Hypoxia 10/19/2020   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 084/16/6063  Chronic systolic dysfunction of left ventricle 07/02/2016   Heart murmur 07/02/2016    Social History   reports that she has never smoked. She has never been exposed to tobacco smoke. She has never used smokeless tobacco. She reports that she does not drink alcohol and does not use drugs.   Family History  family history includes Heart attack in her sister; Heart failure in her sister; Kidney disease in her mother.   Medications: reviewed and updated   Objective:   Physical Exam BP (!) 77/46   Pulse 98   Temp 98.3 F (36.8 C)   Resp 16   Ht 5' 6.93" (1.7 m)   Wt 118 lb (53.5 kg)   SpO2 (!) 78%   BMI 18.52 kg/m   Physical Exam HENT:     Head: Normocephalic and atraumatic.  Eyes:     Extraocular Movements: Extraocular movements intact.     Conjunctiva/sclera: Conjunctivae normal.     Pupils: Pupils are equal, round, and reactive to light.  Cardiovascular:     Rate and Rhythm: Normal rate and regular rhythm.     Pulses: Normal pulses.  Heart sounds: Normal heart sounds.  Pulmonary:     Effort: Pulmonary effort is normal.     Breath sounds: Normal breath sounds.  Musculoskeletal:     Cervical back: Normal range of motion and neck supple.  Neurological:     General: No focal deficit present.     Mental Status: She is alert and oriented to person, place, and time.  Psychiatric:        Mood and  Affect: Mood normal.        Behavior: Behavior normal.    Assessment & Plan:  1. Encounter to establish care - Patient presents today to establish care.  - Return for annual physical examination, labs, and health maintenance.   2. Hypotension, unspecified hypotension type 3. Low O2 saturation 4. Dizziness - Patient today in office with symptomatic hypotension and low oxygen saturation. Practice administrator Denyse Dago called EMS to our office to take patient to [facility] Emergency Department for further evaluation and management. Butch Penny was present at patient's room when EMS arrived and reports patient was transported safely out of our office and paramedics provided with AVS.   5. Chronic systolic heart failure ([place]) 6. Primary cardiomyopathy ([place]) 7. NICM (nonischemic cardiomyopathy) ([place]) 8. Hypertension secondary to other renal disorders 9. Hyperlipidemia with target LDL less than 70 10. ESRD on dialysis [facility]) - Keep all scheduled appointments with Cardiology.  - Keep all scheduled appointments at dialysis.    Patient was given clear instructions to go to Emergency Department or return to medical center if symptoms don't improve, worsen, or new problems develop.The patient verbalized understanding.  I discussed the assessment and treatment plan with the patient. The patient was provided an opportunity to ask questions and all were answered. The patient agreed with the plan and demonstrated an understanding of the instructions.   The patient was advised to call back or seek an in-person evaluation if the symptoms worsen or if the condition fails to improve as anticipated.    Durene Fruits, NP 07/31/2022, 2:05 PM Primary Care at [facility]

## 2022-07-31 ENCOUNTER — Other Ambulatory Visit: Payer: Self-pay

## 2022-07-31 ENCOUNTER — Encounter: Payer: Self-pay | Admitting: Family

## 2022-07-31 ENCOUNTER — Ambulatory Visit (INDEPENDENT_AMBULATORY_CARE_PROVIDER_SITE_OTHER): Payer: Medicare Other | Admitting: Family

## 2022-07-31 ENCOUNTER — Emergency Department (HOSPITAL_COMMUNITY): Payer: Medicare Other

## 2022-07-31 ENCOUNTER — Emergency Department (HOSPITAL_COMMUNITY)
Admission: EM | Admit: 2022-07-31 | Discharge: 2022-07-31 | Disposition: A | Discharge status: Home / Self Care | Payer: Medicare Other | Attending: Emergency Medicine | Admitting: Emergency Medicine

## 2022-07-31 VITALS — BP 77/46 | HR 98 | Temp 98.3°F | Resp 16 | Ht 66.93 in | Wt 118.0 lb

## 2022-07-31 DIAGNOSIS — I132 Hypertensive heart and chronic kidney disease with heart failure and with stage 5 chronic kidney disease, or end stage renal disease: Secondary | ICD-10-CM | POA: Diagnosis not present

## 2022-07-31 DIAGNOSIS — E860 Dehydration: Secondary | ICD-10-CM | POA: Diagnosis not present

## 2022-07-31 DIAGNOSIS — I428 Other cardiomyopathies: Secondary | ICD-10-CM

## 2022-07-31 DIAGNOSIS — E785 Hyperlipidemia, unspecified: Secondary | ICD-10-CM

## 2022-07-31 DIAGNOSIS — I509 Heart failure, unspecified: Secondary | ICD-10-CM | POA: Diagnosis not present

## 2022-07-31 DIAGNOSIS — I151 Hypertension secondary to other renal disorders: Secondary | ICD-10-CM

## 2022-07-31 DIAGNOSIS — I959 Hypotension, unspecified: Secondary | ICD-10-CM | POA: Insufficient documentation

## 2022-07-31 DIAGNOSIS — Z992 Dependence on renal dialysis: Secondary | ICD-10-CM | POA: Diagnosis not present

## 2022-07-31 DIAGNOSIS — N186 End stage renal disease: Secondary | ICD-10-CM | POA: Insufficient documentation

## 2022-07-31 DIAGNOSIS — Z7689 Persons encountering health services in other specified circumstances: Secondary | ICD-10-CM

## 2022-07-31 DIAGNOSIS — Z7982 Long term (current) use of aspirin: Secondary | ICD-10-CM | POA: Insufficient documentation

## 2022-07-31 DIAGNOSIS — R7981 Abnormal blood-gas level: Secondary | ICD-10-CM | POA: Diagnosis not present

## 2022-07-31 DIAGNOSIS — R42 Dizziness and giddiness: Secondary | ICD-10-CM | POA: Diagnosis not present

## 2022-07-31 DIAGNOSIS — I429 Cardiomyopathy, unspecified: Secondary | ICD-10-CM

## 2022-07-31 DIAGNOSIS — Z79899 Other long term (current) drug therapy: Secondary | ICD-10-CM | POA: Diagnosis not present

## 2022-07-31 DIAGNOSIS — I5022 Chronic systolic (congestive) heart failure: Secondary | ICD-10-CM

## 2022-07-31 LAB — BASIC METABOLIC PANEL
Anion gap: 7 (ref 5–15)
BUN: 11 mg/dL (ref 8–23)
CO2: 26 mmol/L (ref 22–32)
Calcium: 9.1 mg/dL (ref 8.9–10.3)
Chloride: 106 mmol/L (ref 98–111)
Creatinine, Ser: 1.08 mg/dL — ABNORMAL HIGH (ref 0.44–1.00)
GFR, Estimated: 58 mL/min — ABNORMAL LOW (ref >60)
Glucose, Bld: 100 mg/dL — ABNORMAL HIGH (ref 70–99)
Potassium: 3.6 mmol/L (ref 3.5–5.1)
Sodium: 139 mmol/L (ref 135–145)

## 2022-07-31 LAB — CBC
HCT: 42.8 % (ref 36.0–46.0)
Hemoglobin: 13 g/dL (ref 12.0–15.0)
MCH: 28.6 pg (ref 26.0–34.0)
MCHC: 30.4 g/dL (ref 30.0–36.0)
MCV: 94.1 fL (ref 80.0–100.0)
Platelets: 192 10*3/uL (ref 150–400)
RBC: 4.55 MIL/uL (ref 3.87–5.11)
RDW: 16.4 % — ABNORMAL HIGH (ref 11.5–15.5)
WBC: 5.9 10*3/uL (ref 4.0–10.5)
nRBC: 0 % (ref 0.0–0.2)

## 2022-07-31 LAB — TSH: TSH: 2.886 u[IU]/mL (ref 0.350–4.500)

## 2022-07-31 LAB — MAGNESIUM: Magnesium: 2.1 mg/dL (ref 1.7–2.4)

## 2022-07-31 MED ORDER — SODIUM CHLORIDE 0.9 % IV BOLUS
500.0000 mL | Freq: Once | INTRAVENOUS | Status: AC
  Administered 2022-07-31: 500 mL via INTRAVENOUS

## 2022-07-31 NOTE — Discharge Instructions (Addendum)
It was a pleasure caring for you today in the emergency department. ° °Please return to the emergency department for any worsening or worrisome symptoms. ° ° °

## 2022-07-31 NOTE — ED Notes (Signed)
EDP notified of low BP

## 2022-07-31 NOTE — Patient Instructions (Addendum)
Heart Failure, Self-Care Heart failure is a serious condition. The following information explains things you need to do to take care of yourself at home. To help you stay as healthy as possible, you may be asked to change your diet, take certain medicines, and make other changes in your life. Your doctor may also give you more specific instructions. If you have problems or questions, call your doctor. What are the risks? Having heart failure makes it more likely for you to have some problems. These problems can get worse if you do not take good care of yourself. Problems may include: Damage to the kidneys, liver, or lungs. Malnutrition. Abnormal heart rhythms. Blood clotting problems that could cause a stroke. Supplies needed: Scale for weighing yourself. Blood pressure monitor. Notebook. Medicines. How to care for yourself when you have heart failure Medicines Take over-the-counter and prescription medicines only as told by your doctor. Take your medicines every day. Do not stop taking your medicine unless your doctor tells you to do so. Do not skip any medicines. Get your prescriptions refilled before you run out of medicine. This is important. Talk with your doctor if you cannot afford your medicines. Eating and drinking  Eat heart-healthy foods. Talk with a diet specialist (dietitian) to create an eating plan. Limit salt (sodium) if told by your doctor. Ask your diet specialist to tell you which seasonings are healthy for your heart. Cook in healthy ways instead of frying. Healthy ways of cooking include roasting, grilling, broiling, baking, poaching, steaming, and stir-frying. Choose foods that: Have no trans fat. Are low in saturated fat and cholesterol. Choose healthy foods, such as: Fresh or frozen fruits and vegetables. Fish. Low-fat (lean) meats. Legumes, such as beans, peas, and lentils. Fat-free or low-fat dairy products. Whole-grain foods. High-fiber foods. Limit how  much fluid you drink, if told by your doctor. Alcohol use Do not drink alcohol if: Your doctor tells you not to drink. Your heart was damaged by alcohol, or you have very bad heart failure. You are pregnant, may be pregnant, or are planning to become pregnant. If you drink alcohol: Limit how much you have to: 0-1 drink a day for women. 0-2 drinks a day for men. Know how much alcohol is in your drink. In the [place] one drink equals one 12 oz bottle of beer (355 mL), one 5 oz glass of wine (148 mL), or one 1 oz glass of hard liquor (44 mL). Lifestyle  Do not smoke or use any products that contain nicotine or tobacco. If you need help quitting, ask your doctor. Do not use nicotine gum or patches before talking to your doctor. Do not use illegal drugs. Lose weight if told by your doctor. Do physical activity if told by your doctor. Talk to your doctor before you begin an exercise if: You are an older adult. You have very bad heart failure. Learn to manage stress. If you need help, ask your doctor. Get physical rehab (rehabilitation) to help you stay independent and to help with your quality of life. Participate in a cardiac rehab program. This program helps you improve your health through exercise, education, and counseling. Plan time to rest when you get tired. Check weight and blood pressure  Weigh yourself every day. This will help you to know if fluid is building up in your body. Weigh yourself every morning after you pee (urinate) and before you eat breakfast. Wear the same amount of clothing each time. Write down your daily weight. Give  your record to your doctor. Check and write down your blood pressure as told by your doctor. Check your pulse as told by your doctor. Dealing with very hot and very cold weather If it is very hot: Avoid activities that take a lot of energy. Use air conditioning or fans, or find a cooler place. Avoid caffeine and alcohol. Wear clothing that is  loose-fitting, lightweight, and light-colored. If it is very cold: Avoid activities that take a lot of energy. Layer your clothes. Wear mittens or gloves, a hat, and a face covering when you go outside. Avoid alcohol. Follow these instructions at home: Stay up to date with shots (vaccines). Get pneumococcal and flu (influenza) shots. Keep all follow-up visits. Contact a doctor if: You gain 2-3 lb (1-1.4 kg) in 24 hours or 5 lb (2.3 kg) in a week. You have increasing shortness of breath. You cannot do your normal activities. You get tired easily. You cough a lot. You do not feel like eating or feel like you may vomit (nauseous). You have swelling in your hands, feet, ankles, or belly (abdomen). You cannot sleep well because it is hard to breathe. You feel like your heart is beating fast (palpitations). You get dizzy when you stand up. You feel depressed or sad. Get help right away if: You have trouble breathing. You or someone else notices a change in your behavior, such as having trouble staying awake. You have chest pain or discomfort. You pass out (faint). These symptoms may be an emergency. Get help right away. Call your local emergency services (911 in the [place] Do not wait to see if the symptoms will go away. Do not drive yourself to the hospital. Summary Heart failure is a serious condition. To care for yourself, you may have to change your diet, take medicines, and make other lifestyle changes. Take your medicines every day. Do not stop taking them unless your doctor tells you to do so. Limit salt and eat heart-healthy foods. Ask your doctor if you can drink alcohol. You may have to stop alcohol use if you have very bad heart failure. Contact your doctor if you gain weight quickly or feel that your heart is beating too fast. Get help right away if you pass out or have chest pain or trouble breathing. This information is not intended to replace advice given to you by your  health care provider. Make sure you discuss any questions you have with your health care provider. Document Revised: 04/21/2020 Document Reviewed: 04/21/2020 Elsevier Patient Education  [employer].

## 2022-07-31 NOTE — ED Provider Notes (Signed)
Assumed care of patient from off-going team. For more details, please see note from same day.  In brief, this is a 61 y.o. female with ESRD on HD who p/w hypotension. Asymptomatic. Normotensive here in ED.   Plan/Dispo at time of sign-out & ED Course since sign-out: '[ ]'$  fluids, reassess  BP 104/68   Pulse 81   Temp (!) 97.4 F (36.3 C) (Oral)   Resp 14   SpO2 98%    ED Course:   5:21 PM Patient states she feels well and would like to go home. Received total of 1L IVF. BPs now ~838 systolic. Patient never had any symptoms. Denies lightheadedness/CP/SOB. Will be discharged w/ discharge instructions/return precautions.    Dispo: DC w/ discharge instructions/return precautions ------------------------------- Cindee Lame, MD Emergency Medicine  This note was created using dictation software, which may contain spelling or grammatical errors.   Audley Hose, MD 07/31/22 [phone]

## 2022-07-31 NOTE — ED Triage Notes (Signed)
PT BIB [facility] from her PCP on [address]  She was there for her annual physical and found to be hypotensive @ 70/40 and hypoxic @ 78%.  On arrival EMS did not find the pt in distress.  After office had placed a hot pack on hand pt 02 was 98% RA for EMS and BP was 85/57 HR 90, RR 16.   PT is A&O x4 and MWF dialysis.  She had her full tx yesterday. Pt is in no distress at this time.

## 2022-07-31 NOTE — Progress Notes (Signed)
.  Pt presents to establish care,  -needs refill on Metoprolol and Meclizine

## 2022-07-31 NOTE — ED Provider Notes (Signed)
[facility] EMERGENCY DEPARTMENT Provider Note   CSN: [id number] Arrival date & time: 07/31/22  1104     History  No chief complaint on file.   Cynthia Bray is a 61 y.o. female.  Patient as above with significant medical history as below, including ESRD, HTN, combined HF LVEF 25-30%,  HD MWF last HD Wednesday with full run who presents to the ED with complaint of hypotension.  Patient was at her PCPs office for a physical, found to have low blood pressure, sent to the ED for evaluation.  There was report of abnormal pulse oximetry reading but on EMS arrival patient was 98% on room air, patient denies any difficulty breathing over the past 12 hours.  Patient ports she received full dialysis session, she felt fine during dialysis and after dialysis.  She has been feeling well, no acute complaints, no dyspnea, no nausea or vomiting, no change in bowel or bladder function, no vomiting, no diarrhea no lightheadedness or near syncope.  No chest pain or dyspnea.  Patient essentially feels at her baseline.  Feels that she is ready go home.   Did not take her blood pressure medication this morning, reports that she takes them as needed  Past Medical History:  Diagnosis Date   CHF (congestive heart failure) (HCC)    EF 25%   ESRD (end stage renal disease) ([place])    GERD (gastroesophageal reflux disease)    Hypertension    Hyperthyroidism    Renal disorder     Past Surgical History:  Procedure Laterality Date   AV FISTULA PLACEMENT     CARDIAC CATHETERIZATION     IR FLUORO GUIDE CV LINE RIGHT  04/10/2022   IR REMOVAL TUN CV CATH W/O FL  04/29/2022   IR US GUIDE VASC ACCESS RIGHT  04/10/2022   PARATHYROIDECTOMY N/A 04/03/2022   Procedure: NECK EXPLORATION, LEFT SUPERIOR PARATHYROIDECTOMY, LEFT INFERIOR PARATHYROIDECTOMY, RIGHT SUPERIOR PARATHYROIDECTOMY;  Surgeon: Armandina Gemma, MD;  Location: [facility];  Service: General;  Laterality: N/A;   VASCULAR SURGERY     fistula  bilaterally. left arm removed     The history is provided by the patient. No language interpreter was used.       Home Medications Prior to Admission medications   Medication Sig Start Date End Date Taking? Authorizing Provider  aspirin EC 81 MG tablet Take 81 mg by mouth daily. Swallow whole.   Yes [provider]  atenolol (TENORMIN) 25 MG tablet Take 25 mg by mouth daily.   Yes [provider]  calcitRIOL (ROCALTROL) 0.5 MCG capsule Take 8 capsules (4 mcg total) by mouth daily. 04/16/22  Yes Loren Racer, PA-C  calcium carbonate (TUMS) 500 MG chewable tablet Chew 5 tablets (1,000 mg of elemental calcium total) by mouth 3 (three) times daily. Take 5 tabs three times daily between meals Patient taking differently: Chew 2 tablets by mouth 3 (three) times daily. Take 5 tabs three times daily between meals 04/15/22 04/15/23 Yes Stovall, Woodfin Ganja, PA-C  meclizine (ANTIVERT) 25 MG tablet Take 25 mg by mouth daily as needed for dizziness. 07/30/22  Yes [provider]  multivitamin (RENA-VIT) TABS tablet Take 1 tablet by mouth at bedtime. 05/10/21  Yes Aline August, MD  simvastatin (ZOCOR) 20 MG tablet Take 20 mg by mouth daily.   Yes [provider]  VELPHORO 500 MG chewable tablet Chew 500 mg by mouth 3 (three) times daily. 07/14/22  Yes [provider]  Allergies    Sensipar [cinacalcet]; Aplisol [tuberculin, ppd]; Lisinopril; Penicillin g; and Tuberculin ppd    Review of Systems   Review of Systems  Constitutional:  Negative for activity change and fever.  HENT:  Negative for facial swelling and trouble swallowing.   Eyes:  Negative for discharge and redness.  Respiratory:  Negative for cough and shortness of breath.   Cardiovascular:  Negative for chest pain and palpitations.  Gastrointestinal:  Negative for abdominal pain and nausea.  Genitourinary:  Negative for dysuria and flank pain.  Musculoskeletal:  Negative for back pain and  gait problem.  Skin:  Negative for pallor and rash.  Neurological:  Negative for syncope and headaches.    Physical Exam Updated Vital Signs BP 101/87   Pulse 80   Temp (!) 97.4 F (36.3 C) (Oral)   Resp 18   SpO2 98%  Physical Exam Vitals and nursing note reviewed.  Constitutional:      General: She is not in acute distress.    Appearance: Normal appearance.  HENT:     Head: Normocephalic and atraumatic.     Right Ear: External ear normal.     Left Ear: External ear normal.     Nose: Nose normal.     Mouth/Throat:     Mouth: Mucous membranes are moist.  Eyes:     General: No scleral icterus.       Right eye: No discharge.        Left eye: No discharge.  Cardiovascular:     Rate and Rhythm: Normal rate and regular rhythm.     Pulses: Normal pulses.     Heart sounds: Normal heart sounds.  Pulmonary:     Effort: Pulmonary effort is normal. No tachypnea, accessory muscle usage or respiratory distress.     Breath sounds: Normal breath sounds.  Abdominal:     General: Abdomen is flat.     Tenderness: There is no abdominal tenderness.  Musculoskeletal:        General: Normal range of motion.     Cervical back: Normal range of motion.     Right lower leg: No edema.     Left lower leg: No edema.  Skin:    General: Skin is warm and dry.     Capillary Refill: Capillary refill takes less than 2 seconds.       Neurological:     Mental Status: She is alert and oriented to person, place, and time.     GCS: GCS eye subscore is 4. GCS verbal subscore is 5. GCS motor subscore is 6.  Psychiatric:        Mood and Affect: Mood normal.        Behavior: Behavior normal.     ED Results / Procedures / Treatments   Labs (all labs ordered are listed, but only abnormal results are displayed) Labs Reviewed  CBC - Abnormal; Notable for the following components:      Result Value   RDW 16.4 (*)    All other components within normal limits  BASIC METABOLIC PANEL - Abnormal; Notable  for the following components:   Glucose, Bld 100 (*)    Creatinine, Ser 1.08 (*)    GFR, Estimated 58 (*)    All other components within normal limits  TSH  MAGNESIUM    EKG EKG Interpretation  Date/Time:  Thursday July 31 2022 11:06:13 EDT Ventricular Rate:  86 PR Interval:  132 QRS Duration: 91 QT Interval:  362  QTC Calculation: 433 R Axis:   35 Text Interpretation: Sinus rhythm Borderline T wave abnormalities similar to prior no stemi Confirmed by Wynona Dove ([id number]) on 07/31/2022 11:17:26 AM  Radiology DG Chest Portable 1 View  Result Date: 07/31/2022 CLINICAL DATA:  61 year old female with history of hypotension. EXAM: PORTABLE CHEST 1 VIEW COMPARISON:  Chest x-ray 06/17/2021. FINDINGS: Lung volumes are normal. No consolidative airspace disease. No pleural effusions. No pneumothorax. No pulmonary nodule or mass noted. Pulmonary vasculature and the cardiomediastinal silhouette are within normal limits. IMPRESSION: No radiographic evidence of acute cardiopulmonary disease. Electronically Signed   By: Vinnie Langton M.D.   On: 07/31/2022 11:43    Procedures Procedures    Medications Ordered in ED Medications  sodium chloride 0.9 % bolus 500 mL (0 mLs Intravenous Stopped 07/31/22 1412)  sodium chloride 0.9 % bolus 500 mL (0 mLs Intravenous Stopped 07/31/22 1614)    ED Course/ Medical Decision Making/ A&P                           Medical Decision Making Amount and/or Complexity of Data Reviewed Labs: ordered. Radiology: ordered.   This patient presents to the ED with chief complaint(s) of abnormal vital signs  with pertinent past medical history of ESRD on HD, CHF, as above which further complicates the presenting complaint. The complaint involves an extensive differential diagnosis and also carries with it a high risk of complications and morbidity.    The differential diagnosis includes but not limited to medication effect, overdiuresis, hypovolemia,  hypothyroidism, metabolic, infectious, etc. Serious etiologies were considered.   The initial plan is to screening labs, chest x-ray, EKG, give small bolus of fluids   Additional history obtained: Additional history obtained from family Records reviewed Primary Care Documents and home meds,  Follows with Dr Oval Linsey cardiology Recent parathyroidectomy 7/23 Dr. Harlow Asa  Independent labs interpretation:  The following labs were independently interpreted:  CBC and BMP are stable, mag, elevated, TSH within normal limits  Independent visualization of imaging: - I independently visualized the following imaging with scope of interpretation limited to determining acute life threatening conditions related to emergency care: Chest x-ray, which revealed unremarkable  Cardiac monitoring was reviewed and interpreted by myself which shows NSR  Treatment and Reassessment: IV fluids 1 L >> bp improved  Consultation: - Consulted or discussed management/test interpretation w/ external professional: Not applicable  Consideration for admission or further workup: Admission was considered    Pt with asymptomatic hypotension, bp was low at PCP office so she was sent to the ED for evaluation, she was initially hypotensive but totally asymptomatic.  Labs reviewed and are stable.  She did have dialysis yesterday.  No fevers, chills, nausea or vomiting, no diarrhea.  No change to typical p.o. intake.  No recent medication changes.  Blood pressure improving after IV fluids, patient signed out to incoming EDP pending completion of fluids and repeat bp. Anticipate discharge if bp continues to improve/is stable. Close o/p f/u was encouraged   Social Determinants of health: Social History   Tobacco Use   Smoking status: Never    Passive exposure: Never   Smokeless tobacco: Never  Vaping Use   Vaping Use: Never used  Substance Use Topics   Alcohol use: Never   Drug use: Never            Final  Clinical Impression(s) / ED Diagnoses Final diagnoses:  ESRD (end stage renal disease) on dialysis ([place])  Mild dehydration    Rx / DC Orders ED Discharge Orders     None         Jeanell Sparrow, DO 08/01/22 1415

## 2022-09-17 ENCOUNTER — Other Ambulatory Visit: Payer: Self-pay | Admitting: Nephrology

## 2022-09-17 DIAGNOSIS — D351 Benign neoplasm of parathyroid gland: Secondary | ICD-10-CM

## 2022-09-20 ENCOUNTER — Other Ambulatory Visit: Payer: Self-pay

## 2022-09-20 ENCOUNTER — Emergency Department (HOSPITAL_COMMUNITY): Payer: Medicare Other

## 2022-09-20 ENCOUNTER — Inpatient Hospital Stay (HOSPITAL_COMMUNITY)
Admission: EM | Admit: 2022-09-20 | Discharge: 2022-09-23 | DRG: 640 | Disposition: A | Discharge status: Home / Self Care | Payer: Medicare Other | Attending: Internal Medicine | Admitting: Internal Medicine

## 2022-09-20 ENCOUNTER — Encounter (HOSPITAL_COMMUNITY): Payer: Self-pay

## 2022-09-20 ENCOUNTER — Inpatient Hospital Stay (HOSPITAL_COMMUNITY): Payer: Medicare Other

## 2022-09-20 ENCOUNTER — Emergency Department (HOSPITAL_COMMUNITY): Payer: Commercial Managed Care - HMO

## 2022-09-20 DIAGNOSIS — E785 Hyperlipidemia, unspecified: Secondary | ICD-10-CM | POA: Diagnosis present

## 2022-09-20 DIAGNOSIS — K219 Gastro-esophageal reflux disease without esophagitis: Secondary | ICD-10-CM | POA: Diagnosis present

## 2022-09-20 DIAGNOSIS — D649 Anemia, unspecified: Secondary | ICD-10-CM | POA: Diagnosis not present

## 2022-09-20 DIAGNOSIS — Z8249 Family history of ischemic heart disease and other diseases of the circulatory system: Secondary | ICD-10-CM | POA: Diagnosis not present

## 2022-09-20 DIAGNOSIS — N2581 Secondary hyperparathyroidism of renal origin: Secondary | ICD-10-CM | POA: Diagnosis present

## 2022-09-20 DIAGNOSIS — N186 End stage renal disease: Secondary | ICD-10-CM | POA: Diagnosis present

## 2022-09-20 DIAGNOSIS — I132 Hypertensive heart and chronic kidney disease with heart failure and with stage 5 chronic kidney disease, or end stage renal disease: Secondary | ICD-10-CM | POA: Diagnosis present

## 2022-09-20 DIAGNOSIS — Z888 Allergy status to other drugs, medicaments and biological substances status: Secondary | ICD-10-CM | POA: Diagnosis not present

## 2022-09-20 DIAGNOSIS — R55 Syncope and collapse: Secondary | ICD-10-CM

## 2022-09-20 DIAGNOSIS — Z7982 Long term (current) use of aspirin: Secondary | ICD-10-CM

## 2022-09-20 DIAGNOSIS — M898X9 Other specified disorders of bone, unspecified site: Secondary | ICD-10-CM | POA: Diagnosis present

## 2022-09-20 DIAGNOSIS — W19XXXA Unspecified fall, initial encounter: Secondary | ICD-10-CM | POA: Diagnosis present

## 2022-09-20 DIAGNOSIS — Z841 Family history of disorders of kidney and ureter: Secondary | ICD-10-CM

## 2022-09-20 DIAGNOSIS — I959 Hypotension, unspecified: Secondary | ICD-10-CM | POA: Diagnosis present

## 2022-09-20 DIAGNOSIS — Z880 Allergy status to penicillin: Secondary | ICD-10-CM | POA: Diagnosis not present

## 2022-09-20 DIAGNOSIS — E861 Hypovolemia: Secondary | ICD-10-CM | POA: Diagnosis not present

## 2022-09-20 DIAGNOSIS — E0590 Thyrotoxicosis, unspecified without thyrotoxic crisis or storm: Secondary | ICD-10-CM | POA: Diagnosis present

## 2022-09-20 DIAGNOSIS — Z992 Dependence on renal dialysis: Secondary | ICD-10-CM | POA: Diagnosis not present

## 2022-09-20 DIAGNOSIS — I5042 Chronic combined systolic (congestive) and diastolic (congestive) heart failure: Secondary | ICD-10-CM | POA: Diagnosis present

## 2022-09-20 DIAGNOSIS — R627 Adult failure to thrive: Secondary | ICD-10-CM | POA: Diagnosis present

## 2022-09-20 DIAGNOSIS — Y92009 Unspecified place in unspecified non-institutional (private) residence as the place of occurrence of the external cause: Secondary | ICD-10-CM | POA: Diagnosis not present

## 2022-09-20 DIAGNOSIS — I9589 Other hypotension: Secondary | ICD-10-CM | POA: Diagnosis not present

## 2022-09-20 DIAGNOSIS — R54 Age-related physical debility: Secondary | ICD-10-CM | POA: Diagnosis present

## 2022-09-20 DIAGNOSIS — Z79899 Other long term (current) drug therapy: Secondary | ICD-10-CM

## 2022-09-20 LAB — BASIC METABOLIC PANEL
Anion gap: 18 — ABNORMAL HIGH (ref 5–15)
BUN: 19 mg/dL (ref 8–23)
CO2: 25 mmol/L (ref 22–32)
Calcium: 10.7 mg/dL — ABNORMAL HIGH (ref 8.9–10.3)
Chloride: 96 mmol/L — ABNORMAL LOW (ref 98–111)
Creatinine, Ser: 5.95 mg/dL — ABNORMAL HIGH (ref 0.44–1.00)
GFR, Estimated: 7 mL/min — ABNORMAL LOW (ref >60)
Glucose, Bld: 117 mg/dL — ABNORMAL HIGH (ref 70–99)
Potassium: 4.1 mmol/L (ref 3.5–5.1)
Sodium: 139 mmol/L (ref 135–145)

## 2022-09-20 LAB — ECHOCARDIOGRAM COMPLETE
AR max vel: 2.01 cm2
AV Area VTI: 1.99 cm2
AV Area mean vel: 1.74 cm2
AV Mean grad: 7 mmHg
AV Peak grad: 11.7 mmHg
Ao pk vel: 1.71 m/s
Area-P 1/2: 3.2 cm2
Height: 68 in
MV VTI: 2.36 cm2
S' Lateral: 2.9 cm
Weight: 1920 oz

## 2022-09-20 LAB — I-STAT CHEM 8, ED
BUN: 21 mg/dL (ref 8–23)
Calcium, Ion: 1.14 mmol/L — ABNORMAL LOW (ref 1.15–1.40)
Chloride: 101 mmol/L (ref 98–111)
Creatinine, Ser: 6.5 mg/dL — ABNORMAL HIGH (ref 0.44–1.00)
Glucose, Bld: 115 mg/dL — ABNORMAL HIGH (ref 70–99)
HCT: 44 % (ref 36.0–46.0)
Hemoglobin: 15 g/dL (ref 12.0–15.0)
Potassium: 4 mmol/L (ref 3.5–5.1)
Sodium: 139 mmol/L (ref 135–145)
TCO2: 29 mmol/L (ref 22–32)

## 2022-09-20 LAB — DIFFERENTIAL
Abs Immature Granulocytes: 0.04 10*3/uL (ref 0.00–0.07)
Basophils Absolute: 0.1 10*3/uL (ref 0.0–0.1)
Basophils Relative: 1 %
Eosinophils Absolute: 0.1 10*3/uL (ref 0.0–0.5)
Eosinophils Relative: 1 %
Immature Granulocytes: 1 %
Lymphocytes Relative: 28 %
Lymphs Abs: 2.4 10*3/uL (ref 0.7–4.0)
Monocytes Absolute: 0.7 10*3/uL (ref 0.1–1.0)
Monocytes Relative: 8 %
Neutro Abs: 5.1 10*3/uL (ref 1.7–7.7)
Neutrophils Relative %: 61 %

## 2022-09-20 LAB — CBC
HCT: 41.7 % (ref 36.0–46.0)
HCT: 36.9 % (ref 36.0–46.0)
Hemoglobin: 13.1 g/dL (ref 12.0–15.0)
Hemoglobin: 11.5 g/dL — ABNORMAL LOW (ref 12.0–15.0)
MCH: 29.2 pg (ref 26.0–34.0)
MCH: 29 pg (ref 26.0–34.0)
MCHC: 31.4 g/dL (ref 30.0–36.0)
MCHC: 31.2 g/dL (ref 30.0–36.0)
MCV: 93.1 fL (ref 80.0–100.0)
MCV: 93.2 fL (ref 80.0–100.0)
Platelets: 228 10*3/uL (ref 150–400)
Platelets: 164 10*3/uL (ref 150–400)
RBC: 4.48 MIL/uL (ref 3.87–5.11)
RBC: 3.96 MIL/uL (ref 3.87–5.11)
RDW: 17.8 % — ABNORMAL HIGH (ref 11.5–15.5)
RDW: 17.9 % — ABNORMAL HIGH (ref 11.5–15.5)
WBC: 7.4 10*3/uL (ref 4.0–10.5)
WBC: 8.4 10*3/uL (ref 4.0–10.5)
nRBC: 0 % (ref 0.0–0.2)
nRBC: 0 % (ref 0.0–0.2)

## 2022-09-20 LAB — HEPATIC FUNCTION PANEL
ALT: 10 U/L (ref 0–44)
AST: 20 U/L (ref 15–41)
Albumin: 4.2 g/dL (ref 3.5–5.0)
Alkaline Phosphatase: 298 U/L — ABNORMAL HIGH (ref 38–126)
Bilirubin, Direct: <0.1 mg/dL (ref 0.0–0.2)
Total Bilirubin: 0.6 mg/dL (ref 0.3–1.2)
Total Protein: 8.9 g/dL — ABNORMAL HIGH (ref 6.5–8.1)

## 2022-09-20 LAB — LACTIC ACID, PLASMA
Lactic Acid, Venous: 2 mmol/L (ref 0.5–1.9)
Lactic Acid, Venous: 2.8 mmol/L (ref 0.5–1.9)
Lactic Acid, Venous: 2.1 mmol/L (ref 0.5–1.9)

## 2022-09-20 LAB — TROPONIN I (HIGH SENSITIVITY)
Troponin I (High Sensitivity): 33 ng/L — ABNORMAL HIGH (ref <18)
Troponin I (High Sensitivity): 33 ng/L — ABNORMAL HIGH (ref <18)

## 2022-09-20 LAB — D-DIMER, QUANTITATIVE: D-Dimer, Quant: 0.82 ug/mL-FEU — ABNORMAL HIGH (ref 0.00–0.50)

## 2022-09-20 LAB — HIV ANTIBODY (ROUTINE TESTING W REFLEX): HIV Screen 4th Generation wRfx: NONREACTIVE

## 2022-09-20 LAB — TSH: TSH: 3.561 u[IU]/mL (ref 0.350–4.500)

## 2022-09-20 LAB — LIPASE, BLOOD
Lipase: 43 U/L (ref 11–51)
Lipase: 41 U/L (ref 11–51)

## 2022-09-20 LAB — CBG MONITORING, ED: Glucose-Capillary: 113 mg/dL — ABNORMAL HIGH (ref 70–99)

## 2022-09-20 LAB — CORTISOL: Cortisol, Plasma: 7.7 ug/dL

## 2022-09-20 LAB — MRSA NEXT GEN BY PCR, NASAL: MRSA by PCR Next Gen: NOT DETECTED

## 2022-09-20 MED ORDER — CHLORHEXIDINE GLUCONATE CLOTH 2 % EX PADS
6.0000 | MEDICATED_PAD | Freq: Every day | CUTANEOUS | Status: DC
  Administered 2022-09-20 – 2022-09-23 (×4): 6 via TOPICAL

## 2022-09-20 MED ORDER — NOREPINEPHRINE 4 MG/250ML-% IV SOLN
0.0000 ug/min | INTRAVENOUS | Status: DC
  Administered 2022-09-20: 2 ug/min via INTRAVENOUS
  Administered 2022-09-20: 5 ug/min via INTRAVENOUS
  Filled 2022-09-20 (×2): qty 250

## 2022-09-20 MED ORDER — SODIUM CHLORIDE 0.9 % IV BOLUS
250.0000 mL | Freq: Once | INTRAVENOUS | Status: AC
  Administered 2022-09-20: 250 mL via INTRAVENOUS

## 2022-09-20 MED ORDER — HEPARIN SODIUM (PORCINE) 5000 UNIT/ML IJ SOLN
5000.0000 [IU] | Freq: Three times a day (TID) | INTRAMUSCULAR | Status: DC
  Administered 2022-09-20 – 2022-09-23 (×9): 5000 [IU] via SUBCUTANEOUS
  Filled 2022-09-20 (×8): qty 1

## 2022-09-20 MED ORDER — SODIUM CHLORIDE 0.9 % IV BOLUS
500.0000 mL | Freq: Once | INTRAVENOUS | Status: AC
  Administered 2022-09-20: 500 mL via INTRAVENOUS

## 2022-09-20 MED ORDER — IOHEXOL 350 MG/ML SOLN
75.0000 mL | Freq: Once | INTRAVENOUS | Status: AC | PRN
  Administered 2022-09-20: 75 mL via INTRAVENOUS

## 2022-09-20 MED ORDER — ORAL CARE MOUTH RINSE: 15.0000 mL | OROMUCOSAL | Status: DC | PRN

## 2022-09-20 MED ORDER — DOCUSATE SODIUM 100 MG PO CAPS: 100.0000 mg | ORAL_CAPSULE | Freq: Two times a day (BID) | ORAL | Status: DC | PRN

## 2022-09-20 MED ORDER — ASPIRIN 81 MG PO TBEC
81.0000 mg | DELAYED_RELEASE_TABLET | Freq: Every day | ORAL | Status: DC
  Administered 2022-09-21 – 2022-09-22 (×2): 81 mg via ORAL
  Filled 2022-09-20 (×2): qty 1

## 2022-09-20 MED ORDER — NOREPINEPHRINE 4 MG/250ML-% IV SOLN: 0.0000 ug/min | INTRAVENOUS | Status: DC

## 2022-09-20 MED ORDER — SODIUM CHLORIDE 0.9 % IV SOLN: INTRAVENOUS | Status: DC | PRN

## 2022-09-20 MED ORDER — MIDODRINE HCL 5 MG PO TABS
5.0000 mg | ORAL_TABLET | Freq: Three times a day (TID) | ORAL | Status: DC
  Administered 2022-09-20 – 2022-09-21 (×4): 5 mg via ORAL
  Filled 2022-09-20 (×4): qty 1

## 2022-09-20 MED ORDER — MIDODRINE HCL 5 MG PO TABS: 5.0000 mg | ORAL_TABLET | Freq: Three times a day (TID) | ORAL | Status: DC

## 2022-09-20 MED ORDER — POLYETHYLENE GLYCOL 3350 17 G PO PACK: 17.0000 g | PACK | Freq: Every day | ORAL | Status: DC | PRN

## 2022-09-20 MED ORDER — SIMVASTATIN 20 MG PO TABS
20.0000 mg | ORAL_TABLET | Freq: Every day | ORAL | Status: DC
  Administered 2022-09-20 – 2022-09-22 (×3): 20 mg via ORAL
  Filled 2022-09-20 (×3): qty 1

## 2022-09-20 NOTE — ED Triage Notes (Signed)
Pt reports she was walking toward her door and "passed out"  falling and hitting her head.  Pt states she struck the back of her head on the right.  Reports never having had this happen before. Pt is hypotensive and tachycardic in triage.  Called for room.

## 2022-09-20 NOTE — H&P (Addendum)
NAME:  Cynthia Bray, MRN:  [id number], DOB:  1961-07-18, LOS: 0 ADMISSION DATE:  09/20/2022, CONSULTATION DATE:  09/20/22 REFERRING MD:  Dr. Ralene Bathe, CHIEF COMPLAINT:  syncope/ hypotension  History of Present Illness:   61 year old female with prior history of ESRD MWF via RUE AVF, HTN, chronic systolic and diastolic HF (EF 81-27%, N1ZG), moderate MR, GERD, and hyperthyroidism who presented to ER after syncopal episode at home.   Patient reports being in her normal state of health recently except for a brief dizzy spell 2 weeks ago that resolved after sitting down.  Some what of a poor historian, as she said she takes atenolol every day, last dose 12/7 but then states she only takes as needed but does not check her blood pressure at home.  Only on ASA 95m at home.  Last iHD 12/8.  She remembers her blood pressure being low throughout treatment but had a full treatment and thinks they took off around 2L.  Unclear what her dry weight is but denies any recent weight loss or gain.   Came home and was asymptomatic but got up to go the door and became dizzy and woke up on the floor.  She fell back hitting the back of her head on the right.  She denies any recent appetite changes, poor PO intake, vision changes, ha, sick contacts, fever, chills, chest pain, shortness of breath beyond her baseline exertional dyspnea walking up stairs, no cough, N/V or abd pain, leg swelling, recent travel, bleeding, or medication changes.  Still voids every other day, last void 12/8 without symptoms.    In ER, was afebrile, tachycardic into the 120s, normoxia, and hypotensive with SBP in the 70's.  Her mental status has remained intact despite low readings and generally asymptomatic other than some mild diffuse tenderness on abd exam, last BM this morning and reported looser than her normal.  Some difficulty obtaining accurate reading in ER as she is restricted on LUE and only [place] is in left UE so blood pressure readings have  been on left forearm, and LE readings lower than LUE readings.  Labs noted for sCr 6.5, K 4, iCa 1.14, alk phos 298 (chronically elevated), trop hs 33> 33, lactic acid 2.8, d-dimer 0.82, and unremarkable CBC. EKG nonacute, ST, CXR showing hyperinflation with mild basilar atelectasis vs scarring, CT a/p noted fluid in right and transverse colon which may reflect enteritis/ diarrhea with retained stool in the distal colon, but no other acute or inflammatory process noted.  Treated with 1.5L of NS without improvement therefore placed on peripheral norepinephrine.  CTA PE study pending.  PCCM called for admit.    Pertinent  Medical History  ESRD MWF via RUE AVF, HTN, combined HF (EF 25-30%), moderate MR, GERD, hyperparathyroidism s/p parathyroidectomy 7/23, hypocalcemia, failure to thrive   Never smoker Quit daily ETOH 260yrago Denies illicit drug use   Significant Hospital Events: Including procedures, antibiotic start and stop dates in addition to other pertinent events   12/9 admit for hypotension, syncopal episode on NE  Interim History / Subjective:   Objective   Blood pressure (!) 69/50, pulse 93, temperature 98.3 F (36.8 C), temperature source Oral, resp. rate 18, height _0  (1.727 m), weight 54.4 kg, SpO2 98 %.        Intake/Output Summary (Last 24 hours) at 09/20/2022 0734 Last data filed at 09/20/2022 [id number] per 24 hour  Intake 1000 ml  Output --  Net 1000 ml  Filed Weights   09/20/22 0308  Weight: 54.4 kg   Examination: General:  Pleasant and thin older female lying in bed in NAD HEENT: MM pink/moist, pupils 3/reactive, mild JVP Neuro: alert, oriented, MAE CV: rr, NSR, no murmur PULM:  non labored, clear throughout, room air 100% GI:  slight distention, +bs, diffuse tenderness, no guarding Extremities: warm/dry, no LE edema, RUE AVF +b/t Skin: no rashes   Resolved Hospital Problem list    Assessment & Plan:   Shock, undifferentiated  Syncope  - on EMR  review> appears patient has been having lower blood pressures for several months and previous failure to thrive although she says her weights and nutrition have been stable and questionably using her atenolol daily vs prn but not checking her BP's regularly.  Question if this is natural progression of disease course  - They have had some difficulty obtaining accurate Bps in ER given arm and IV restrictions but her mental status remains reassuring and mostly asymptomatic despite low readings questioning validity of BP readings.   - unclear if 2L removed during iHD yesterday with lower bp reading, could be contributing but she looks euvolemic - no clear source of infectious as she is afebrile, normal WBC and symptomatic  - CTH neg  P:  - admit to ICU - wean peripheral NE for MAP goal 60-65 - consider Aline for accurate BP assessment if having mental status changes, however cuff on Left forearm with good readings and no mental status thus far despite readings - if escalating pressor requirement will need CVL placement - add midodrine 70m TID - IV team consulted for second PIV, difficult stick  - pending TSH, add on differential  - initial lactic elevated, repeat ordered after IVFs - check cortisol  - follow blood cultures - pending UA - trend WBC/ fever curve.  Consider trending PCT - hold on abx for now, will discuss with attending given abd tenderness but no concerning findings on CT a/p, ? 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  Lipase pending.     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 with recs for repeat CT in 3-6 months   Best Practice (right click and "Reselect all SmartList Selections" daily)   Diet/type: NPO DVT prophylaxis: prophylactic heparin  GI prophylaxis: N/A and PPI Lines: N/A Foley:  N/A Code Status:  full code Last date of multidisciplinary goals of care discussion [12/9]  [facility] [phone]  Labs   CBC: Recent Labs  Lab 09/20/22 0145 09/20/22 0239  WBC 7.4  --   HGB 13.1 15.0  HCT 41.7 44.0  MCV 93.1  --   PLT 228  --     Basic Metabolic Panel: Recent Labs  Lab 09/20/22 0145 09/20/22 0239  NA 139 139  K 4.1 4.0  CL 96* 101  CO2 25  --   GLUCOSE 117* 115*  BUN 19 21  CREATININE 5.95* 6.50*  CALCIUM 10.7*  --    GFR: Estimated Creatinine Clearance: 7.7 mL/min (A) (by C-G formula based on SCr of 6.5 mg/dL (H)). Recent Labs  Lab 09/20/22 0145 09/20/22 0506  WBC 7.4  --   LATICACIDVEN  --  2.8*    Liver Function Tests: Recent Labs  Lab 09/20/22 0150  AST 20  ALT 10  ALKPHOS 298*  BILITOT 0.6  PROT 8.9*  ALBUMIN 4.2   Recent Labs  Lab 09/20/22 0150  LIPASE 43   No results for input(s): "AMMONIA" in the last 168 hours.  ABG    Component Value Date/Time   HCO3 28.1 (H) 05/07/2021 1851   TCO2 29 09/20/2022 0239   O2SAT 15.7 05/07/2021 1851     Coagulation Profile: No results for input(s): "INR", "PROTIME" in the last  168 hours.  Cardiac Enzymes: No results for input(s): "CKTOTAL", "CKMB", "CKMBINDEX", "TROPONINI" in the last 168 hours.  HbA1C: No results found for: "HGBA1C"  CBG: Recent Labs  Lab 09/20/22 0158  GLUCAP 113*    Review of Systems:   As per HPI otherwise negative.   Past Medical History:  She,  has a past medical history of CHF (congestive heart failure) ([place]), ESRD (end stage renal disease) ([place]), GERD (gastroesophageal reflux disease), Hypertension, Hyperthyroidism, and Renal disorder.   Surgical History:   Past Surgical History:  Procedure Laterality Date   AV FISTULA PLACEMENT     CARDIAC CATHETERIZATION     IR FLUORO GUIDE CV LINE RIGHT  04/10/2022   IR REMOVAL TUN CV CATH W/O FL  04/29/2022   IR US GUIDE VASC ACCESS RIGHT  04/10/2022   PARATHYROIDECTOMY N/A 04/03/2022   Procedure: NECK EXPLORATION, LEFT SUPERIOR PARATHYROIDECTOMY, LEFT INFERIOR PARATHYROIDECTOMY, RIGHT SUPERIOR PARATHYROIDECTOMY;  Surgeon: Armandina Gemma, MD;  Location: [facility];  Service: General;  Laterality: N/A;   VASCULAR SURGERY     fistula bilaterally. left arm removed     Social History:   reports that she has never smoked. She has never been exposed to tobacco smoke. She has never used smokeless tobacco. She reports that she does not drink alcohol and does not use drugs.   Family History:  Her family history includes Heart attack in her sister; Heart failure in her sister; Kidney disease in her mother.   Allergies Allergies  Allergen Reactions   Sensipar [Cinacalcet] Nausea And Vomiting   Aplisol [Tuberculin, Ppd] Swelling   Lisinopril Swelling    Angioedema    Penicillin G Swelling   Tuberculin Ppd Other (See Comments) and Swelling     Home Medications  Prior to Admission medications   Medication Sig Start Date End Date Taking? Authorizing Provider  aspirin EC 81 MG tablet Take 81 mg by mouth daily. Swallow whole.    [provider]  atenolol (TENORMIN) 25 MG tablet Take 25 mg  by mouth daily.    [provider]  calcitRIOL (ROCALTROL) 0.5 MCG capsule Take 8 capsules (4 mcg total) by mouth daily. 04/16/22   Loren Racer,  PA-C  calcium carbonate (TUMS) 500 MG chewable tablet Chew 5 tablets (1,000 mg of elemental calcium total) by mouth 3 (three) times daily. Take 5 tabs three times daily between meals Patient taking differently: Chew 2 tablets by mouth 3 (three) times daily. Take 5 tabs three times daily between meals 04/15/22 04/15/23  Loren Racer, PA-C  meclizine (ANTIVERT) 25 MG tablet Take 25 mg by mouth daily as needed for dizziness. 07/30/22   [provider]  multivitamin (RENA-VIT) TABS tablet Take 1 tablet by mouth at bedtime. 05/10/21   Aline August, MD  simvastatin (ZOCOR) 20 MG tablet Take 20 mg by mouth daily.    [provider]  VELPHORO 500 MG chewable tablet Chew 500 mg by mouth 3 (three) times daily. 07/14/22   [provider]     Critical care time: 76 mins     Kennieth Rad, MSN, AG-ACNP-BC [facility] 09/20/2022, 7:57 AM  See Amion for pager If no response to pager, please call PCCM consult pager After 7:00 pm call Elink

## 2022-09-20 NOTE — ED Provider Notes (Signed)
[facility] EMERGENCY DEPARTMENT Provider Note   CSN: [id number] Arrival date & time: 09/20/22  0127     History  Chief Complaint  Patient presents with   Loss of Consciousness    Cynthia Bray is a 61 y.o. female.  The history is provided by the patient and a relative.  Loss of Consciousness Cynthia Bray is a 61 y.o. female who presents to the Emergency Department complaining of syncope.  She presents to the emergency department accompanied by her son for evaluation following a syncopal event.  She has ESRD and dialyzes Monday, Wednesday, Friday.  She had a full session today without difficulty.  After arriving home she did lay down for little bed and then when she went to get up she passed out and hit her head.  She had a friend at the home that assisted her up.  She has been unable to walk successfully since this occurred because she gets dizzy when she attempts to walk.  She complains of some pain to her head.  No associated fever, chest pain, shortness of breath, vomiting, diarrhea, abdominal pain, hematochezia or melena.     She has a history of hypertension but does not take her blood pressure medication on dialysis days.  Her last dose of atenolol was Thursday.  Home Medications Prior to Admission medications   Medication Sig Start Date End Date Taking? Authorizing Provider  aspirin EC 81 MG tablet Take 81 mg by mouth daily. Swallow whole.    [provider]  atenolol (TENORMIN) 25 MG tablet Take 25 mg by mouth daily.    [provider]  calcitRIOL (ROCALTROL) 0.5 MCG capsule Take 8 capsules (4 mcg total) by mouth daily. 04/16/22   Loren Racer, PA-C  calcium carbonate (TUMS) 500 MG chewable tablet Chew 5 tablets (1,000 mg of elemental calcium total) by mouth 3 (three) times daily. Take 5 tabs three times daily between meals Patient taking differently: Chew 2 tablets by mouth 3 (three) times daily. Take 5 tabs three times daily between  meals 04/15/22 04/15/23  Loren Racer, PA-C  meclizine (ANTIVERT) 25 MG tablet Take 25 mg by mouth daily as needed for dizziness. 07/30/22   [provider]  multivitamin (RENA-VIT) TABS tablet Take 1 tablet by mouth at bedtime. 05/10/21   Aline August, MD  simvastatin (ZOCOR) 20 MG tablet Take 20 mg by mouth daily.    [provider]  VELPHORO 500 MG chewable tablet Chew 500 mg by mouth 3 (three) times daily. 07/14/22   [provider]      Allergies    Sensipar [cinacalcet]; Aplisol [tuberculin, ppd]; Lisinopril; Penicillin g; and Tuberculin ppd    Review of Systems   Review of Systems  Cardiovascular:  Positive for syncope.  All other systems reviewed and are negative.   Physical Exam Updated Vital Signs BP (!) 90/52   Pulse 95   Temp 98.5 F (36.9 C) (Oral)   Resp 13   Ht '5\' 8"'$  (1.727 m)   Wt 54.4 kg   SpO2 98%   BMI 18.25 kg/m  Physical Exam Vitals and nursing note reviewed.  Constitutional:      Appearance: She is well-developed.  HENT:     Head: Normocephalic and atraumatic.  Cardiovascular:     Rate and Rhythm: Regular rhythm. Tachycardia present.     Heart sounds: No murmur heard. Pulmonary:     Effort: Pulmonary effort is normal. No respiratory distress.  Breath sounds: Normal breath sounds.  Abdominal:     Palpations: Abdomen is soft.     Tenderness: There is no abdominal tenderness. There is no guarding or rebound.  Musculoskeletal:        General: No tenderness.     Comments: Fistula in the right upper extremity with palpable thrill.  2+ femoral pulses bilaterally.  No significant lower extremity edema.  Skin:    General: Skin is warm and dry.  Neurological:     Mental Status: She is alert and oriented to person, place, and time.     Comments: 5 out of 5 strength in all 4 extremities  Psychiatric:        Behavior: Behavior normal.     ED Results / Procedures / Treatments   Labs (all labs ordered are listed, but  only abnormal results are displayed) Labs Reviewed  BASIC METABOLIC PANEL - Abnormal; Notable for the following components:      Result Value   Chloride 96 (*)    Glucose, Bld 117 (*)    Creatinine, Ser 5.95 (*)    Calcium 10.7 (*)    GFR, Estimated 7 (*)    Anion gap 18 (*)    All other components within normal limits  CBC - Abnormal; Notable for the following components:   RDW 17.8 (*)    All other components within normal limits  HEPATIC FUNCTION PANEL - Abnormal; Notable for the following components:   Total Protein 8.9 (*)    Alkaline Phosphatase 298 (*)    All other components within normal limits  D-DIMER, QUANTITATIVE - Abnormal; Notable for the following components:   D-Dimer, Quant 0.82 (*)    All other components within normal limits  LACTIC ACID, PLASMA - Abnormal; Notable for the following components:   Lactic Acid, Venous 2.8 (*)    All other components within normal limits  CBG MONITORING, ED - Abnormal; Notable for the following components:   Glucose-Capillary 113 (*)    All other components within normal limits  I-STAT CHEM 8, ED - Abnormal; Notable for the following components:   Creatinine, Ser 6.50 (*)    Glucose, Bld 115 (*)    Calcium, Ion 1.14 (*)    All other components within normal limits  TROPONIN I (HIGH SENSITIVITY) - Abnormal; Notable for the following components:   Troponin I (High Sensitivity) 33 (*)    All other components within normal limits  TROPONIN I (HIGH SENSITIVITY) - Abnormal; Notable for the following components:   Troponin I (High Sensitivity) 33 (*)    All other components within normal limits  CULTURE, BLOOD (ROUTINE X 2)  CULTURE, BLOOD (ROUTINE X 2)  LIPASE, BLOOD  URINALYSIS, ROUTINE W REFLEX MICROSCOPIC  LACTIC ACID, PLASMA    EKG EKG Interpretation  Date/Time:  Saturday September 20 2022 01:37:32 EST Ventricular Rate:  125 PR Interval:  136 QRS Duration: 84 QT Interval:  312 QTC Calculation: 450 R Axis:   66 Text  Interpretation: Sinus tachycardia Possible Anterior infarct , age undetermined Abnormal ECG Confirmed by Quintella Reichert [phone]) on 09/20/2022 1:41:23 AM  Radiology CT ABDOMEN PELVIS WO CONTRAST  Result Date: 09/20/2022 CLINICAL DATA:  61 year old female with left lower quadrant pain. Chronic diarrhea. EXAM: CT ABDOMEN AND PELVIS WITHOUT CONTRAST TECHNIQUE: Multidetector CT imaging of the abdomen and pelvis was performed following the standard protocol without IV contrast. RADIATION DOSE REDUCTION: This exam was performed according to the departmental dose-optimization program which includes automated exposure control, adjustment  of the mA and/or kV according to patient size and/or use of iterative reconstruction technique. COMPARISON:  CTA chest 05/07/2021. FINDINGS: Lower chest: Substantially more normal lung bases compared to the CTA last year, mild lung base atelectasis or scarring now. Small to moderate gastric hiatal hernia. No pericardial or pleural effusion. Hepatobiliary: Noncontrast liver appears stable with unchanged small right hepatic lobe circumscribed low-density benign cyst or hemangioma series 3, image 20 (no follow-up imaging recommended). Negative gallbladder. Pancreas: Pancreatic body and tail appear relatively normal. Pancreatic head and uncinate demonstrate multiple dystrophic calcifications. No obvious pancreatic mass or ductal dilatation. No inflammation. Spleen: Negative. Adrenals/Urinary Tract: Normal adrenal glands. Bilateral renal atrophy. Multiple small frequently exophytic and benign appearing renal cysts (no follow-up imaging recommended). Mild bilateral nephrocalcinosis. No hydronephrosis, hydroureter, pararenal inflammation. Completely decompressed bladder. Incidental pelvic phleboliths. Stomach/Bowel: Redundant but nondilated large bowel. There is fluid in the it right colon and transverse colon, but formed stool from the splenic flexure distally. No large bowel inflammation is  identified, the appendix is normal on coronal image 30. Terminal ileum is fairly decompressed and appears negative. No dilated small bowel. Gastric hiatal hernia. Otherwise negative stomach and duodenum. No free air or free fluid identified. Vascular/Lymphatic: Normal caliber abdominal aorta. Aortoiliac calcified atherosclerosis. Vascular patency is not evaluated in the absence of IV contrast. No lymphadenopathy identified. Reproductive: Very bulky fibroid uterus with multiple subserosal calcified uterine fibroids measuring between 5 and 7 cm diameter. Negative noncontrast adnexa. Other: No pelvic free fluid. Numerous pelvic vascular calcifications. Musculoskeletal: Diffusely abnormal bone mineralization, similar to the CTA last year. Favor renal osteodystrophy. Widespread chronic appearing vertebral endplate deformities. No destructive osseous lesion identified. IMPRESSION: 1. Fluid in the right and transverse colon which may indicate enteritis/diarrhea. Retained stool in the distal colon. Normal appendix. 2. No other acute or inflammatory process identified in the non-contrast abdomen or pelvis. But other notable findings include: Bilateral renal atrophy, renal osteodystrophy. Chronic calcific pancreatitis. Very bulky fibroid uterus. Small to moderate gastric hiatal hernia. Aortic Atherosclerosis (ICD10-I70.0). Electronically Signed   By: Genevie Ann M.D.   On: 09/20/2022 05:12   CT HEAD WO CONTRAST  Result Date: 09/20/2022 CLINICAL DATA:  Head trauma, GCS=15, loss of consciousness (LOC) (Ped 0-17y) EXAM: CT HEAD WITHOUT CONTRAST TECHNIQUE: Contiguous axial images were obtained from the base of the skull through the vertex without intravenous contrast. RADIATION DOSE REDUCTION: This exam was performed according to the departmental dose-optimization program which includes automated exposure control, adjustment of the mA and/or kV according to patient size and/or use of iterative reconstruction technique.  COMPARISON:  None Available. FINDINGS: Brain: Normal anatomic configuration. Parenchymal volume loss is commensurate with the patient's age. Moderate periventricular white matter changes are present likely reflecting the sequela of small vessel ischemia. No abnormal intra or extra-axial mass lesion or fluid collection. No abnormal mass effect or midline shift. No evidence of acute intracranial hemorrhage or infarct. Ventricular size is normal. Cerebellum unremarkable. Vascular: No asymmetric hyperdense vasculature at the skull base. Skull: Intact Sinuses/Orbits: Paranasal sinuses are clear. Orbits are unremarkable. Other: Mastoid air cells and middle ear cavities are clear. IMPRESSION: 1. No acute intracranial abnormality. No calvarial fracture. 2. Moderate senescent change. Electronically Signed   By: Fidela Salisbury M.D.   On: 09/20/2022 02:43   DG Chest Port 1 View  Result Date: 09/20/2022 CLINICAL DATA:  Syncope. EXAM: PORTABLE CHEST 1 VIEW COMPARISON:  07/31/2022. FINDINGS: The heart size and mediastinal contours are within normal limits. Hyperinflation of the lungs is noted.  Mild atelectasis or scarring is noted at the lung bases bilaterally. No effusion or pneumothorax. Surgical clips are present in the cervical soft tissues on the right. No acute osseous abnormality. IMPRESSION: Hyperinflation of the lungs with mild atelectasis or scarring at the lung bases. Electronically Signed   By: Brett Fairy M.D.   On: 09/20/2022 02:11    Procedures Procedures   CRITICAL CARE Performed by: Quintella Reichert   Total critical care time: 35 minutes  Critical care time was exclusive of separately billable procedures and treating other patients.  Critical care was necessary to treat or prevent imminent or life-threatening deterioration.  Critical care was time spent personally by me on the following activities: development of treatment plan with patient and/or surrogate as well as nursing, discussions with  consultants, evaluation of patient's response to treatment, examination of patient, obtaining history from patient or surrogate, ordering and performing treatments and interventions, ordering and review of laboratory studies, ordering and review of radiographic studies, pulse oximetry and re-evaluation of patient's condition.  EMERGENCY DEPARTMENT ULTRASOUND  Study: Limited Retroperitoneal Ultrasound of the Abdominal Aorta.  INDICATIONS:Abnormal vital signs Multiple views of the abdominal aorta were obtained in real-time from the diaphragmatic hiatus to the aortic bifurcation in transverse planes with a multi-frequency probe.  PERFORMED BY: Myself IMAGES ARCHIVED?: Yes LIMITATIONS:  Bowel gas INTERPRETATION:  No abdominal aortic aneurysm   Medications Ordered in ED Medications  norepinephrine (LEVOPHED) '4mg'$  in 235m (0.016 mg/mL) premix infusion (has no administration in time range)  sodium chloride 0.9 % bolus 500 mL (0 mLs Intravenous Stopped 09/20/22 0509)  sodium chloride 0.9 % bolus 500 mL (0 mLs Intravenous Stopped 09/20/22 0627)  sodium chloride 0.9 % bolus 500 mL (500 mLs Intravenous New Bag/Given 09/20/22 02707    ED Course/ Medical Decision Making/ A&P                           Medical Decision Making Amount and/or Complexity of Data Reviewed Labs: ordered. Radiology: ordered.  Risk Prescription drug management. Decision regarding hospitalization.   Patient with ESRD on hemodialysis, hypertension and CHF here for evaluation following a syncopal event.  Patient hypotensive and tachycardic at time of ED evaluation.  She is well-perfused on examination.  No evidence of acute infectious process at this time.  Hemoglobin is normal.  No significant electrolyte abnormality.  Troponins are mildly elevated, improved when compared to priors.  Her heart rate did improve with IV fluid administration but she does continue to have hypotension.  Her lactic acid is mildly elevated.  Critical  care consulted given patient's persistent hypotension.        Final Clinical Impression(s) / ED Diagnoses Final diagnoses:  Syncope and collapse  Hypotension, unspecified hypotension type    Rx / DC Orders ED Discharge Orders     None         RQuintella Reichert MD 09/20/22 0725

## 2022-09-20 NOTE — Progress Notes (Signed)
[facility] Progress Note Patient Name: Cynthia Bray DOB: 1960-11-13 MRN: [id number]   Date of Service  09/20/2022  HPI/Events of Note  Pt with downtrending BP.  Pt with known CHF, EF 25%.  SpO2 >95% on room air. No peripheral edema noted I/Os show pt positive 2L since admission.   eICU Interventions  Placed order to bolus 556m NS.  Will continue to monitor closely.         AOslo12/06/2022, 10:15 PM

## 2022-09-20 NOTE — Consult Note (Signed)
[facility] Renal Consultation Note    Indication for Consultation:  Management of ESRD/hemodialysis, anemia, hypertension/volume, and secondary hyperparathyroidism. PCP:  HPI: Cynthia Bray is a 61 y.o. female with ESRD, HTN, HFrEF (25%, G3DD in 04/2021), hypocalcemia s/p parathyroidectomy who was admitted with hypotension/syncopal episode.  Was in her usual state of health yesterday. Had HD yesterday morning without incident. Afterwards, she took a nap. Her friend came to visit and she became acutely dizzy when stood to let him in -- tried to make it back to bed to lay down, but had syncopal episode and struck her head on floor. Brought to ED - there she was hypotensive - 82/55 and tachycardic - HR 120's. Labs with Na 139, K 4.1, CO2 25, Ca 10.7, Trop 33 -> 33, WBC 7.4, Hgb 13.1, LA 2 -> 2.8. She has been given incremental fluid boluses - up to 1.75L at this time - then started on norepinephrine drip. She was also given midodrine. Even with that - her SBP remains low 100's.  Seen in ED bed - looks well. Denies recent CP, dyspnea, fever, chills, N/V/D.  Dialyzes on MWF schedule at [facility] unit - she has not missed any HD sessions. Reviewing records - looks that her dry weight is probably too low. Previously was set at 55.5kg until Wed of this week when it was increased to 56kg. She had typically been leaving around 57kg range. On 12/9 - her post-HD when was 55.8kg which is the lowest she has been in months. She does not have edema on exam.  Past Medical History:  Diagnosis Date   CHF (congestive heart failure) (HCC)    EF 25%   ESRD (end stage renal disease) (HCC)    GERD (gastroesophageal reflux disease)    Hypertension    Hyperthyroidism    Renal disorder    Past Surgical History:  Procedure Laterality Date   AV FISTULA PLACEMENT     CARDIAC CATHETERIZATION     IR FLUORO GUIDE CV LINE RIGHT  04/10/2022   IR REMOVAL TUN CV CATH W/O FL  04/29/2022   IR US GUIDE  VASC ACCESS RIGHT  04/10/2022   PARATHYROIDECTOMY N/A 04/03/2022   Procedure: NECK EXPLORATION, LEFT SUPERIOR PARATHYROIDECTOMY, LEFT INFERIOR PARATHYROIDECTOMY, RIGHT SUPERIOR PARATHYROIDECTOMY;  Surgeon: Armandina Gemma, MD;  Location: [facility];  Service: General;  Laterality: N/A;   VASCULAR SURGERY     fistula bilaterally. left arm removed   Family History  Problem Relation Age of Onset   Kidney disease Mother    Heart failure Sister    Heart attack Sister    Social History:  reports that she has never smoked. She has never been exposed to tobacco smoke. She has never used smokeless tobacco. She reports that she does not drink alcohol and does not use drugs.  ROS: As per HPI otherwise negative.  Physical Exam: Vitals:   09/20/22 1124 09/20/22 1136 09/20/22 1145 09/20/22 1200  BP: 93/81 (!) 88/72 (!) 81/61 (!) 101/91  Pulse: 83 83 96 100  Resp: '19 15 19 19  '$ Temp:  98.7 F (37.1 C)    TempSrc:  Oral    SpO2: 98% 99% 96% 98%  Weight:      Height:         General: Thin, but well appearing woman, NAD.  Room air. Head: Normocephalic, atraumatic, sclera non-icteric, mucus membranes are moist. Neck: Supple without lymphadenopathy/masses. JVD not elevated. Lungs: Clear bilaterally to auscultation without wheezes, rales, or rhonchi. Breathing  is unlabored. Heart: RRR with normal S1, S2. No murmurs, rubs, or gallops appreciated. Abdomen: Soft, non-tender, non-distended with normoactive bowel sounds.  Musculoskeletal:  Strength and tone appear normal for age. Lower extremities: No edema or ischemic changes, no open wounds. Neuro: Alert and oriented X 3. Moves all extremities spontaneously. Psych:  Responds to questions appropriately with a normal affect. Dialysis Access: R AVG + bruit/thrill  Allergies  Allergen Reactions   Sensipar [Cinacalcet] Nausea And Vomiting   Aplisol [Tuberculin, Ppd] Swelling   Lisinopril Swelling    Angioedema    Penicillin G Swelling   Prior to Admission  medications   Medication Sig Start Date End Date Taking? Authorizing Provider  acetaminophen (TYLENOL) 500 MG tablet Take 1,000 mg by mouth daily as needed for mild pain, moderate pain, fever or headache.   Yes [provider]  aspirin EC 81 MG tablet Take 81 mg by mouth in the morning.   Yes [provider]  atenolol (TENORMIN) 25 MG tablet Take 25 mg by mouth in the morning.   Yes [provider]  calcitRIOL (ROCALTROL) 0.5 MCG capsule Take 8 capsules (4 mcg total) by mouth daily. Patient taking differently: Take 4 mcg by mouth in the morning. 04/16/22  Yes Loren Racer, PA-C  calcium carbonate (TUMS) 500 MG chewable tablet Chew 5 tablets (1,000 mg of elemental calcium total) by mouth 3 (three) times daily. Take 5 tabs three times daily between meals Patient taking differently: Chew 1,000 mg by mouth 3 (three) times daily. 04/15/22 04/15/23 Yes Dorethia Jeanmarie, Woodfin Ganja, PA-C  meclizine (ANTIVERT) 25 MG tablet Take 25 mg by mouth daily as needed for dizziness. 07/30/22  Yes [provider]  multivitamin (RENA-VIT) TABS tablet Take 1 tablet by mouth at bedtime. Patient taking differently: Take 1 tablet by mouth in the morning. 05/10/21  Yes Aline August, MD  simvastatin (ZOCOR) 20 MG tablet Take 20 mg by mouth in the morning.   Yes [provider]  VELPHORO 500 MG chewable tablet Chew 500 mg by mouth 3 (three) times daily with meals. 07/14/22  Yes [provider]  Calcium Acetate 668 (169 Ca) MG TABS Take 2,004 mg by mouth with breakfast, with lunch, and with evening meal. Patient not taking: Reported on 09/20/2022    [provider]   Current Facility-Administered Medications  Medication Dose Route Frequency Provider Last Rate Last Admin   [START ON 09/21/2022] aspirin EC tablet 81 mg  81 mg Oral Daily Jennelle Human B, NP       docusate sodium (COLACE) capsule 100 mg  100 mg Oral BID PRN Jennelle Human B, NP       heparin injection 5,000  Units  5,000 Units Subcutaneous Q8H Jennelle Human B, NP       midodrine (PROAMATINE) tablet 5 mg  5 mg Oral Q8H Ramaswamy, Murali, MD   5 mg at 09/20/22 1144   norepinephrine (LEVOPHED) '4mg'$  in 276m (0.016 mg/mL) premix infusion  0-40 mcg/min Intravenous Continuous RQuintella Reichert MD 30 mL/hr at 09/20/22 0930 8 mcg/min at 09/20/22 0930   polyethylene glycol (MIRALAX / GLYCOLAX) packet 17 g  17 g Oral Daily PRN SArnell Asal NP       simvastatin (ZOCOR) tablet 20 mg  20 mg Oral QHS SJennelle HumanB, NP       Current Outpatient Medications  Medication Sig Dispense Refill   acetaminophen (TYLENOL) 500 MG tablet Take 1,000 mg by mouth daily as needed for mild pain, moderate  pain, fever or headache.     aspirin EC 81 MG tablet Take 81 mg by mouth in the morning.     atenolol (TENORMIN) 25 MG tablet Take 25 mg by mouth in the morning.     calcitRIOL (ROCALTROL) 0.5 MCG capsule Take 8 capsules (4 mcg total) by mouth daily. (Patient taking differently: Take 4 mcg by mouth in the morning.) 30 capsule 6   calcium carbonate (TUMS) 500 MG chewable tablet Chew 5 tablets (1,000 mg of elemental calcium total) by mouth 3 (three) times daily. Take 5 tabs three times daily between meals (Patient taking differently: Chew 1,000 mg by mouth 3 (three) times daily.) 450 tablet 3   meclizine (ANTIVERT) 25 MG tablet Take 25 mg by mouth daily as needed for dizziness.     multivitamin (RENA-VIT) TABS tablet Take 1 tablet by mouth at bedtime. (Patient taking differently: Take 1 tablet by mouth in the morning.) 30 tablet 0   simvastatin (ZOCOR) 20 MG tablet Take 20 mg by mouth in the morning.     VELPHORO 500 MG chewable tablet Chew 500 mg by mouth 3 (three) times daily with meals.     Calcium Acetate 668 (169 Ca) MG TABS Take 2,004 mg by mouth with breakfast, with lunch, and with evening meal. (Patient not taking: Reported on 09/20/2022)     Labs: Basic Metabolic Panel: Recent Labs  Lab 09/20/22 0145 09/20/22 0239   NA 139 139  K 4.1 4.0  CL 96* 101  CO2 25  --   GLUCOSE 117* 115*  BUN 19 21  CREATININE 5.95* 6.50*  CALCIUM 10.7*  --    Liver Function Tests: Recent Labs  Lab 09/20/22 0150  AST 20  ALT 10  ALKPHOS 298*  BILITOT 0.6  PROT 8.9*  ALBUMIN 4.2   Recent Labs  Lab 09/20/22 0150  LIPASE 43   CBC: Recent Labs  Lab 09/20/22 0145 09/20/22 0239  WBC 7.4  --   HGB 13.1 15.0  HCT 41.7 44.0  MCV 93.1  --   PLT 228  --    Studies/Results: ECHOCARDIOGRAM COMPLETE  Result Date: 09/20/2022    ECHOCARDIOGRAM REPORT   Patient Name:   KATERIN NEGRETE Date of Exam: 09/20/2022 Medical Rec #:  [id number]     Height:       68.0 in Accession #:    [id number]    Weight:       120.0 lb Date of Birth:  16-Aug-1961    BSA:          1.645 m Patient Age:    32 years      BP:           96/64 mmHg Patient Gender: F             HR:           86 bpm. Exam Location:  Inpatient Procedure: 2D Echo, Cardiac Doppler and Color Doppler Indications:    Syncope  History:        Patient has prior history of Echocardiogram examinations, most                 recent 05/08/2021. CHF and Cardiomyopathy, CAD,                 Arrythmias:Tachycardia, Signs/Symptoms:Syncope; Risk                 Factors:Hypertension and Dyslipidemia. ESRD on Dialysis.  Sonographer:    Wenda Low Referring Phys: [phone]  PAULA B SIMPSON IMPRESSIONS  1. Left ventricular ejection fraction, by estimation, is 60 to 65%. The left ventricle has normal function. The left ventricle has no regional wall motion abnormalities. Left ventricular diastolic parameters are consistent with Grade I diastolic dysfunction (impaired relaxation).  2. Right ventricular systolic function is normal. The right ventricular size is normal. There is moderately elevated pulmonary artery systolic pressure.  3. The mitral valve is degenerative. Trivial mitral valve regurgitation.  4. The aortic valve is tricuspid. There is mild thickening of the aortic valve. Aortic valve  regurgitation is trivial. Aortic valve sclerosis is present, with no evidence of aortic valve stenosis.  5. The inferior vena cava is normal in size with greater than 50% respiratory variability, suggesting right atrial pressure of 3 mmHg. Comparison(s): Prior images reviewed side by side. Compared to prior TTE on 04/2021, the LVEF has improved significantly from 25-30% to 60-65%. FINDINGS  Left Ventricle: Left ventricular ejection fraction, by estimation, is 60 to 65%. The left ventricle has normal function. The left ventricle has no regional wall motion abnormalities. The left ventricular internal cavity size was normal in size. There is  no left ventricular hypertrophy. Left ventricular diastolic parameters are consistent with Grade I diastolic dysfunction (impaired relaxation). Right Ventricle: The right ventricular size is normal. No increase in right ventricular wall thickness. Right ventricular systolic function is normal. There is moderately elevated pulmonary artery systolic pressure. The tricuspid regurgitant velocity is 3.26 m/s, and with an assumed right atrial pressure of 8 mmHg, the estimated right ventricular systolic pressure is 00.9 mmHg. Left Atrium: Left atrial size was normal in size. Right Atrium: Right atrial size was normal in size. Pericardium: There is no evidence of pericardial effusion. Mitral Valve: The mitral valve is degenerative in appearance. There is mild thickening of the mitral valve leaflet(s). There is mild calcification of the mitral valve leaflet(s). Mild mitral annular calcification. Trivial mitral valve regurgitation. MV peak gradient, 5.8 mmHg. The mean mitral valve gradient is 2.0 mmHg. Tricuspid Valve: The tricuspid valve is normal in structure. Tricuspid valve regurgitation is mild. Aortic Valve: The aortic valve is tricuspid. There is mild thickening of the aortic valve. Aortic valve regurgitation is trivial. Aortic valve sclerosis is present, with no evidence of aortic  valve stenosis. Aortic valve mean gradient measures 7.0 mmHg. Aortic valve peak gradient measures 11.7 mmHg. Aortic valve area, by VTI measures 1.99 cm. Pulmonic Valve: The pulmonic valve was normal in structure. Pulmonic valve regurgitation is trivial. Aorta: The aortic root is normal in size and structure. Venous: The inferior vena cava is normal in size with greater than 50% respiratory variability, suggesting right atrial pressure of 3 mmHg. IAS/Shunts: The interatrial septum is aneurysmal. The atrial septum is grossly normal.  LEFT VENTRICLE PLAX 2D LVIDd:         4.20 cm   Diastology LVIDs:         2.90 cm   LV e' medial:    5.98 cm/s LV PW:         1.00 cm   LV E/e' medial:  15.9 LV IVS:        0.90 cm   LV e' lateral:   7.72 cm/s LVOT diam:     1.90 cm   LV E/e' lateral: 12.3 LV SV:         70 LV SV Index:   43 LVOT Area:     2.84 cm  RIGHT VENTRICLE RV Basal diam:  3.50 cm RV Mid diam:  2.40 cm RV S prime:     14.70 cm/s TAPSE (M-mode): 2.2 cm LEFT ATRIUM             Index        RIGHT ATRIUM           Index LA diam:        3.20 cm 1.95 cm/m   RA Area:     15.10 cm LA Vol (A2C):   45.7 ml 27.78 ml/m  RA Volume:   41.90 ml  25.47 ml/m LA Vol (A4C):   31.8 ml 19.33 ml/m LA Biplane Vol: 40.4 ml 24.56 ml/m  AORTIC VALVE                     PULMONIC VALVE AV Area (Vmax):    2.01 cm      PV Vmax:       1.14 m/s AV Area (Vmean):   1.74 cm      PV Peak grad:  5.2 mmHg AV Area (VTI):     1.99 cm AV Vmax:           171.00 cm/s AV Vmean:          124.000 cm/s AV VTI:            0.353 m AV Peak Grad:      11.7 mmHg AV Mean Grad:      7.0 mmHg LVOT Vmax:         121.00 cm/s LVOT Vmean:        76.100 cm/s LVOT VTI:          0.248 m LVOT/AV VTI ratio: 0.70  AORTA Ao Root diam: 3.20 cm MITRAL VALVE               TRICUSPID VALVE MV Area (PHT): 3.20 cm    TR Peak grad:   42.5 mmHg MV Area VTI:   2.36 cm    TR Vmax:        326.00 cm/s MV Peak grad:  5.8 mmHg MV Mean grad:  2.0 mmHg    SHUNTS MV Vmax:       1.20  m/s    Systemic VTI:  0.25 m MV Vmean:      69.2 cm/s   Systemic Diam: 1.90 cm MV Decel Time: 237 msec MV E velocity: 95.10 cm/s MV A velocity: 99.40 cm/s MV E/A ratio:  0.96 Gwyndolyn Kaufman MD Electronically signed by Gwyndolyn Kaufman MD Signature Date/Time: 09/20/2022/11:28:23 AM    Final    CT Angio Chest PE W/Cm &/Or Wo Cm  Result Date: 09/20/2022 CLINICAL DATA:  Syncopal episode. Patient struck the back of her head. Patient is hypotensive. Positive D-dimer. EXAM: CT ANGIOGRAPHY CHEST WITH CONTRAST TECHNIQUE: Multidetector CT imaging of the chest was performed using the standard protocol during bolus administration of intravenous contrast. Multiplanar CT image reconstructions and MIPs were obtained to evaluate the vascular anatomy. RADIATION DOSE REDUCTION: This exam was performed according to the departmental dose-optimization program which includes automated exposure control, adjustment of the mA and/or kV according to patient size and/or use of iterative reconstruction technique. CONTRAST:  13m OMNIPAQUE IOHEXOL 350 MG/ML SOLN COMPARISON:  05/07/2021. FINDINGS: Cardiovascular: Pulmonary arteries are well opacified. There is no evidence of a pulmonary embolism. Heart is normal in size and configuration. Three-vessel coronary artery calcifications. No pericardial effusion. Mild dilation of the distal aortic arch to 3.4 cm. Mild aortic atherosclerosis. No dissection. Mediastinum/Nodes: Soft tissue in the anterior mediastinum measuring  approximately 6 x 1.5 x 3.3 cm, increased when compared to the prior CT. No other mediastinal mass or evidence of lymphadenopathy. Trachea and esophagus are unremarkable. Lungs/Pleura: Mild linear subsegmental atelectasis in the lung bases and dependent lower lobes. Remainder of the lungs is clear. No pleural effusion or pneumothorax. Upper Abdomen: No acute abnormality. Musculoskeletal: Diffuse skeletal sclerosis unchanged from the prior study. No destructive bone lesions.  No chest wall mass. Review of the MIP images confirms the above findings. IMPRESSION: 1. No evidence of a pulmonary embolism. 2. No acute findings. 3. Increased anterior mediastinal soft tissue. 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 Recommend follow-up chest CT in 3-6 months to assess for stability. 4. Skeletal changes consistent with renal osteodystrophy. Aortic Atherosclerosis (ICD10-I70.0). Electronically Signed   By: Lajean Manes M.D.   On: 09/20/2022 08:19   CT ABDOMEN PELVIS WO CONTRAST  Result Date: 09/20/2022 CLINICAL DATA:  61 year old female with left lower quadrant pain. Chronic diarrhea. EXAM: CT ABDOMEN AND PELVIS WITHOUT CONTRAST TECHNIQUE: Multidetector CT imaging of the abdomen and pelvis was performed following the standard protocol without IV contrast. RADIATION DOSE REDUCTION: This exam was performed according to the departmental dose-optimization program which includes automated exposure control, adjustment of the mA and/or kV according to patient size and/or use of iterative reconstruction technique. COMPARISON:  CTA chest 05/07/2021. FINDINGS: Lower chest: Substantially more normal lung bases compared to the CTA last year, mild lung base atelectasis or scarring now. Small to moderate gastric hiatal hernia. No pericardial or pleural effusion. Hepatobiliary: Noncontrast liver appears stable with unchanged small right hepatic lobe circumscribed low-density benign cyst or hemangioma series 3, image 20 (no follow-up imaging recommended). Negative gallbladder. Pancreas: Pancreatic body and tail appear relatively normal. Pancreatic head and uncinate demonstrate multiple dystrophic calcifications. No obvious pancreatic mass or ductal dilatation. No inflammation. Spleen: Negative. Adrenals/Urinary Tract: Normal adrenal glands. Bilateral renal atrophy. Multiple small frequently exophytic and benign appearing renal cysts (no follow-up imaging recommended). Mild bilateral nephrocalcinosis. No  hydronephrosis, hydroureter, pararenal inflammation. Completely decompressed bladder. Incidental pelvic phleboliths. Stomach/Bowel: Redundant but nondilated large bowel. There is fluid in the it right colon and transverse colon, but formed stool from the splenic flexure distally. No large bowel inflammation is identified, the appendix is normal on coronal image 30. Terminal ileum is fairly decompressed and appears negative. No dilated small bowel. Gastric hiatal hernia. Otherwise negative stomach and duodenum. No free air or free fluid identified. Vascular/Lymphatic: Normal caliber abdominal aorta. Aortoiliac calcified atherosclerosis. Vascular patency is not evaluated in the absence of IV contrast. No lymphadenopathy identified. Reproductive: Very bulky fibroid uterus with multiple subserosal calcified uterine fibroids measuring between 5 and 7 cm diameter. Negative noncontrast adnexa. Other: No pelvic free fluid. Numerous pelvic vascular calcifications. Musculoskeletal: Diffusely abnormal bone mineralization, similar to the CTA last year. Favor renal osteodystrophy. Widespread chronic appearing vertebral endplate deformities. No destructive osseous lesion identified. IMPRESSION: 1. Fluid in the right and transverse colon which may indicate enteritis/diarrhea. Retained stool in the distal colon. Normal appendix. 2. No other acute or inflammatory process identified in the non-contrast abdomen or pelvis. But other notable findings include: Bilateral renal atrophy, renal osteodystrophy. Chronic calcific pancreatitis. Very bulky fibroid uterus. Small to moderate gastric hiatal hernia. Aortic Atherosclerosis (ICD10-I70.0). Electronically Signed   By: Genevie Ann M.D.   On: 09/20/2022 05:12   CT HEAD WO CONTRAST  Result Date: 09/20/2022 CLINICAL DATA:  Head trauma, GCS=15, loss of consciousness (LOC) (Ped 0-17y) EXAM: CT HEAD WITHOUT CONTRAST TECHNIQUE: Contiguous axial  images were obtained from the base of the skull  through the vertex without intravenous contrast. RADIATION DOSE REDUCTION: This exam was performed according to the departmental dose-optimization program which includes automated exposure control, adjustment of the mA and/or kV according to patient size and/or use of iterative reconstruction technique. COMPARISON:  None Available. FINDINGS: Brain: Normal anatomic configuration. Parenchymal volume loss is commensurate with the patient's age. Moderate periventricular white matter changes are present likely reflecting the sequela of small vessel ischemia. No abnormal intra or extra-axial mass lesion or fluid collection. No abnormal mass effect or midline shift. No evidence of acute intracranial hemorrhage or infarct. Ventricular size is normal. Cerebellum unremarkable. Vascular: No asymmetric hyperdense vasculature at the skull base. Skull: Intact Sinuses/Orbits: Paranasal sinuses are clear. Orbits are unremarkable. Other: Mastoid air cells and middle ear cavities are clear. IMPRESSION: 1. No acute intracranial abnormality. No calvarial fracture. 2. Moderate senescent change. Electronically Signed   By: Fidela Salisbury M.D.   On: 09/20/2022 02:43   DG Chest Port 1 View  Result Date: 09/20/2022 CLINICAL DATA:  Syncope. EXAM: PORTABLE CHEST 1 VIEW COMPARISON:  07/31/2022. FINDINGS: The heart size and mediastinal contours are within normal limits. Hyperinflation of the lungs is noted. Mild atelectasis or scarring is noted at the lung bases bilaterally. No effusion or pneumothorax. Surgical clips are present in the cervical soft tissues on the right. No acute osseous abnormality. IMPRESSION: Hyperinflation of the lungs with mild atelectasis or scarring at the lung bases. Electronically Signed   By: Brett Fairy M.D.   On: 09/20/2022 02:11    Dialysis Orders:  MWF at [facility] 3:45hr, 350/500, EDW 56kg (recently ^d from 55.5kg), 2K/3Ca, R AVG, no heparin - Calcitriol 73mg PO q HD - no ESA - Hgb prev 12  range  Assessment/Plan: Syncope/hypotension: Head CT negative s/p fall. Negative work-up so far: EKG sinus tachycardia only. CXR clear. CT angio without PE. Abd CT negative. Echo with much improved/normalized EF (60-65%). Blood Cx 12/8 NGTD. TSH normal. No edema on exam. It very well may be that she is just incredibly dry - her dry weight could certainly use some raising - probably more up to 58kg range. Has already been given 1.75L fluid. Her next HD is not going to be until Monday - so probably would just let her eat/drink normally and allow volume to continue to come up and hopefully wean off pressor support. Could consider carotid [place] make sure no issues there.  ESRD: MWF schedule - next HD on 12/11.  Anemia: Hgb > 12, no ESA needed.  Metabolic bone disease: Ca 10.7 - high, had been lower as outpatient since her PTX surgery earlier this year.  Will repeat with next HD and adjust VDRA/binders at that time.  KVeneta Penton PA-C 09/20/2022, 12:23 PM  [facility]

## 2022-09-20 NOTE — ED Provider Notes (Signed)
  [facility] Provider Note      Procedures Ultrasound ED Peripheral IV (Provider)  Date/Time: 09/20/2022 2:18 AM  Performed by: Montine Circle, PA-C Authorized by: Montine Circle, PA-C   Procedure details:    Indications: multiple failed IV attempts     Skin Prep: chlorhexidine gluconate     Location:  Left AC   Angiocath:  18 G   Bedside Ultrasound Guided: Yes     Images: archived     Patient tolerated procedure without complications: Yes     Dressing applied: Yes        Montine Circle, PA-C 09/20/22 0289    Quintella Reichert, MD 09/20/22 [phone]

## 2022-09-20 NOTE — Progress Notes (Signed)
*  PRELIMINARY RESULTS* Echocardiogram 2D Echocardiogram has been performed.  Cynthia Bray 09/20/2022, 11:31 AM

## 2022-09-20 NOTE — ED Notes (Signed)
Pt is right arm restricted d/t dialysis. Pt is a difficult stick and required an Ultrasound IV. This RN was able to get one set of cultures but that was it. EDP made aware and ok with that. Will continue to monitor.

## 2022-09-21 DIAGNOSIS — R55 Syncope and collapse: Secondary | ICD-10-CM | POA: Diagnosis not present

## 2022-09-21 DIAGNOSIS — I959 Hypotension, unspecified: Secondary | ICD-10-CM | POA: Diagnosis not present

## 2022-09-21 LAB — COMPREHENSIVE METABOLIC PANEL
ALT: 9 U/L (ref 0–44)
AST: 13 U/L — ABNORMAL LOW (ref 15–41)
Albumin: 2.8 g/dL — ABNORMAL LOW (ref 3.5–5.0)
Alkaline Phosphatase: 221 U/L — ABNORMAL HIGH (ref 38–126)
Anion gap: 15 (ref 5–15)
BUN: 35 mg/dL — ABNORMAL HIGH (ref 8–23)
CO2: 19 mmol/L — ABNORMAL LOW (ref 22–32)
Calcium: 8.9 mg/dL (ref 8.9–10.3)
Chloride: 104 mmol/L (ref 98–111)
Creatinine, Ser: 8.07 mg/dL — ABNORMAL HIGH (ref 0.44–1.00)
GFR, Estimated: 5 mL/min — ABNORMAL LOW (ref >60)
Glucose, Bld: 96 mg/dL (ref 70–99)
Potassium: 4.5 mmol/L (ref 3.5–5.1)
Sodium: 138 mmol/L (ref 135–145)
Total Bilirubin: 0.7 mg/dL (ref 0.3–1.2)
Total Protein: 6.3 g/dL — ABNORMAL LOW (ref 6.5–8.1)

## 2022-09-21 LAB — HEPATITIS B SURFACE ANTIGEN: Hepatitis B Surface Ag: NONREACTIVE

## 2022-09-21 LAB — CBC
HCT: 33.2 % — ABNORMAL LOW (ref 36.0–46.0)
Hemoglobin: 10.6 g/dL — ABNORMAL LOW (ref 12.0–15.0)
MCH: 29.4 pg (ref 26.0–34.0)
MCHC: 31.9 g/dL (ref 30.0–36.0)
MCV: 92.2 fL (ref 80.0–100.0)
Platelets: 185 10*3/uL (ref 150–400)
RBC: 3.6 MIL/uL — ABNORMAL LOW (ref 3.87–5.11)
RDW: 17.4 % — ABNORMAL HIGH (ref 11.5–15.5)
WBC: 6 10*3/uL (ref 4.0–10.5)
nRBC: 0 % (ref 0.0–0.2)

## 2022-09-21 MED ORDER — CHLORHEXIDINE GLUCONATE CLOTH 2 % EX PADS
6.0000 | MEDICATED_PAD | Freq: Every day | CUTANEOUS | Status: DC
  Administered 2022-09-22 – 2022-09-23 (×2): 6 via TOPICAL

## 2022-09-21 NOTE — Progress Notes (Signed)
Subjective:  feels fine-  norepi off-  BP is being taken on leg so might not be accurate ? Not dizzy but hasn't been up  Objective Vital signs in last 24 hours: Vitals:   09/21/22 0745 09/21/22 0800 09/21/22 0815 09/21/22 0817  BP: 92/68 (!) 85/67 (!) 80/54 (!) 86/51  Pulse: 80 82 79 79  Resp: '17 15 14 15  '$ Temp:      TempSrc:      SpO2: 96% 95% 94% 94%  Weight:      Height:       Weight change:   Intake/Output Summary (Last 24 hours) at 09/21/2022 1034 Last data filed at 09/21/2022 0800 Gross per 24 hour  Intake 1692.07 ml  Output --  Net 1692.07 ml    Dialysis Orders:  MWF at [facility] 3:45hr, 350/500, EDW 56kg (recently ^d from 55.5kg), 2K/3Ca, R AVG, no heparin - Calcitriol 85mg PO q HD - no ESA - Hgb prev 12 range   Assessment/Plan: Syncope/hypotension: Head CT negative s/p fall. Negative work-up so far: EKG sinus tachycardia only. CXR clear. CT angio without PE. Abd CT negative. Echo with much improved/normalized EF (60-65%). Blood Cx 12/8 NGTD. TSH normal. No edema on exam. It very well may be that she is just incredibly dry - her dry weight could certainly use some raising - probably more up to 58kg range. Has already been given over 2 iters of fluid. Her next HD is not going to be until Monday - now off pressors-  would get her up-  sitting up and walking to make sure not dizzy-  not sure how much stock I put in cuff measurement on leg.  If stays stable I actually would be OK with discharge later today ?  Or can keep til tomorrow and get HD here or at OP center ( her time is 10:30)  will not take any volume off  2.  ESRD: MWF schedule - next HD on 12/11. 3 Anemia: Hgb > 12, has come down with fluids not surprisingly but still no ESA needed  4 Metabolic bone disease: Ca 10.7 - high, had been lower as outpatient since her PTX surgery earlier this year.  Will repeat with next HD and adjust VDRA/binders at that time.-  now better        KLouis Meckel   Labs: Basic  Metabolic Panel: Recent Labs  Lab 09/20/22 0145 09/20/22 0239 09/21/22 0612  NA 139 139 138  K 4.1 4.0 4.5  CL 96* 101 104  CO2 25  --  19*  GLUCOSE 117* 115* 96  BUN 19 21 35*  CREATININE 5.95* 6.50* 8.07*  CALCIUM 10.7*  --  8.9   Liver Function Tests: Recent Labs  Lab 09/20/22 0150 09/21/22 0612  AST 20 13*  ALT 10 9  ALKPHOS 298* 221*  BILITOT 0.6 0.7  PROT 8.9* 6.3*  ALBUMIN 4.2 2.8*   Recent Labs  Lab 09/20/22 0150 09/20/22 1755  LIPASE 43 41   No results for input(s): "AMMONIA" in the last 168 hours. CBC: Recent Labs  Lab 09/20/22 0145 09/20/22 0239 09/20/22 1755 09/21/22 0612  WBC 7.4  --  8.4 6.0  NEUTROABS  --   --  5.1  --   HGB 13.1 15.0 11.5* 10.6*  HCT 41.7 44.0 36.9 33.2*  MCV 93.1  --  93.2 92.2  PLT 228  --  164 185   Cardiac Enzymes: No results for input(s): "CKTOTAL", "CKMB", "CKMBINDEX", "TROPONINI" in  the last 168 hours. CBG: Recent Labs  Lab 09/20/22 0158  GLUCAP 113*    Iron Studies: No results for input(s): "IRON", "TIBC", "TRANSFERRIN", "FERRITIN" in the last 72 hours. Studies/Results: ECHOCARDIOGRAM COMPLETE  Result Date: 09/20/2022    ECHOCARDIOGRAM REPORT   Patient Name:   BLENDA WISECUP Date of Exam: 09/20/2022 Medical Rec #:  [id number]     Height:       68.0 in Accession #:    [id number]    Weight:       120.0 lb Date of Birth:  01/05/1961    BSA:          1.645 m Patient Age:    61 years      BP:           96/64 mmHg Patient Gender: F             HR:           86 bpm. Exam Location:  Inpatient Procedure: 2D Echo, Cardiac Doppler and Color Doppler Indications:    Syncope  History:        Patient has prior history of Echocardiogram examinations, most                 recent 05/08/2021. CHF and Cardiomyopathy, CAD,                 Arrythmias:Tachycardia, Signs/Symptoms:Syncope; Risk                 Factors:Hypertension and Dyslipidemia. ESRD on Dialysis.  Sonographer:    Wenda Low Referring Phys: [facility]  1. Left ventricular ejection fraction, by estimation, is 60 to 65%. The left ventricle has normal function. The left ventricle has no regional wall motion abnormalities. Left ventricular diastolic parameters are consistent with Grade I diastolic dysfunction (impaired relaxation).  2. Right ventricular systolic function is normal. The right ventricular size is normal. There is moderately elevated pulmonary artery systolic pressure.  3. The mitral valve is degenerative. Trivial mitral valve regurgitation.  4. The aortic valve is tricuspid. There is mild thickening of the aortic valve. Aortic valve regurgitation is trivial. Aortic valve sclerosis is present, with no evidence of aortic valve stenosis.  5. The inferior vena cava is normal in size with greater than 50% respiratory variability, suggesting right atrial pressure of 3 mmHg. Comparison(s): Prior images reviewed side by side. Compared to prior TTE on 04/2021, the LVEF has improved significantly from 25-30% to 60-65%. FINDINGS  Left Ventricle: Left ventricular ejection fraction, by estimation, is 60 to 65%. The left ventricle has normal function. The left ventricle has no regional wall motion abnormalities. The left ventricular internal cavity size was normal in size. There is  no left ventricular hypertrophy. Left ventricular diastolic parameters are consistent with Grade I diastolic dysfunction (impaired relaxation). Right Ventricle: The right ventricular size is normal. No increase in right ventricular wall thickness. Right ventricular systolic function is normal. There is moderately elevated pulmonary artery systolic pressure. The tricuspid regurgitant velocity is 3.26 m/s, and with an assumed right atrial pressure of 8 mmHg, the estimated right ventricular systolic pressure is 25.9 mmHg. Left Atrium: Left atrial size was normal in size. Right Atrium: Right atrial size was normal in size. Pericardium: There is no evidence of pericardial  effusion. Mitral Valve: The mitral valve is degenerative in appearance. There is mild thickening of the mitral valve leaflet(s). There is mild calcification of the mitral valve leaflet(s). Mild mitral annular calcification. Trivial  mitral valve regurgitation. MV peak gradient, 5.8 mmHg. The mean mitral valve gradient is 2.0 mmHg. Tricuspid Valve: The tricuspid valve is normal in structure. Tricuspid valve regurgitation is mild. Aortic Valve: The aortic valve is tricuspid. There is mild thickening of the aortic valve. Aortic valve regurgitation is trivial. Aortic valve sclerosis is present, with no evidence of aortic valve stenosis. Aortic valve mean gradient measures 7.0 mmHg. Aortic valve peak gradient measures 11.7 mmHg. Aortic valve area, by VTI measures 1.99 cm. Pulmonic Valve: The pulmonic valve was normal in structure. Pulmonic valve regurgitation is trivial. Aorta: The aortic root is normal in size and structure. Venous: The inferior vena cava is normal in size with greater than 50% respiratory variability, suggesting right atrial pressure of 3 mmHg. IAS/Shunts: The interatrial septum is aneurysmal. The atrial septum is grossly normal.  LEFT VENTRICLE PLAX 2D LVIDd:         4.20 cm   Diastology LVIDs:         2.90 cm   LV e' medial:    5.98 cm/s LV PW:         1.00 cm   LV E/e' medial:  15.9 LV IVS:        0.90 cm   LV e' lateral:   7.72 cm/s LVOT diam:     1.90 cm   LV E/e' lateral: 12.3 LV SV:         70 LV SV Index:   43 LVOT Area:     2.84 cm  RIGHT VENTRICLE RV Basal diam:  3.50 cm RV Mid diam:    2.40 cm RV S prime:     14.70 cm/s TAPSE (M-mode): 2.2 cm LEFT ATRIUM             Index        RIGHT ATRIUM           Index LA diam:        3.20 cm 1.95 cm/m   RA Area:     15.10 cm LA Vol (A2C):   45.7 ml 27.78 ml/m  RA Volume:   41.90 ml  25.47 ml/m LA Vol (A4C):   31.8 ml 19.33 ml/m LA Biplane Vol: 40.4 ml 24.56 ml/m  AORTIC VALVE                     PULMONIC VALVE AV Area (Vmax):    2.01 cm      PV  Vmax:       1.14 m/s AV Area (Vmean):   1.74 cm      PV Peak grad:  5.2 mmHg AV Area (VTI):     1.99 cm AV Vmax:           171.00 cm/s AV Vmean:          124.000 cm/s AV VTI:            0.353 m AV Peak Grad:      11.7 mmHg AV Mean Grad:      7.0 mmHg LVOT Vmax:         121.00 cm/s LVOT Vmean:        76.100 cm/s LVOT VTI:          0.248 m LVOT/AV VTI ratio: 0.70  AORTA Ao Root diam: 3.20 cm MITRAL VALVE               TRICUSPID VALVE MV Area (PHT): 3.20 cm    TR Peak grad:   42.5  mmHg MV Area VTI:   2.36 cm    TR Vmax:        326.00 cm/s MV Peak grad:  5.8 mmHg MV Mean grad:  2.0 mmHg    SHUNTS MV Vmax:       1.20 m/s    Systemic VTI:  0.25 m MV Vmean:      69.2 cm/s   Systemic Diam: 1.90 cm MV Decel Time: 237 msec MV E velocity: 95.10 cm/s MV A velocity: 99.40 cm/s MV E/A ratio:  0.96 Gwyndolyn Kaufman MD Electronically signed by Gwyndolyn Kaufman MD Signature Date/Time: 09/20/2022/11:28:23 AM    Final    CT Angio Chest PE W/Cm &/Or Wo Cm  Result Date: 09/20/2022 CLINICAL DATA:  Syncopal episode. Patient struck the back of her head. Patient is hypotensive. Positive D-dimer. EXAM: CT ANGIOGRAPHY CHEST WITH CONTRAST TECHNIQUE: Multidetector CT imaging of the chest was performed using the standard protocol during bolus administration of intravenous contrast. Multiplanar CT image reconstructions and MIPs were obtained to evaluate the vascular anatomy. RADIATION DOSE REDUCTION: This exam was performed according to the departmental dose-optimization program which includes automated exposure control, adjustment of the mA and/or kV according to patient size and/or use of iterative reconstruction technique. CONTRAST:  75m OMNIPAQUE IOHEXOL 350 MG/ML SOLN COMPARISON:  05/07/2021. FINDINGS: Cardiovascular: Pulmonary arteries are well opacified. There is no evidence of a pulmonary embolism. Heart is normal in size and configuration. Three-vessel coronary artery calcifications. No pericardial effusion. Mild dilation of  the distal aortic arch to 3.4 cm. Mild aortic atherosclerosis. No dissection. Mediastinum/Nodes: Soft tissue in the anterior mediastinum measuring approximately 6 x 1.5 x 3.3 cm, increased when compared to the prior CT. No other mediastinal mass or evidence of lymphadenopathy. Trachea and esophagus are unremarkable. Lungs/Pleura: Mild linear subsegmental atelectasis in the lung bases and dependent lower lobes. Remainder of the lungs is clear. No pleural effusion or pneumothorax. Upper Abdomen: No acute abnormality. Musculoskeletal: Diffuse skeletal sclerosis unchanged from the prior study. No destructive bone lesions. No chest wall mass. Review of the MIP images confirms the above findings. IMPRESSION: 1. No evidence of a pulmonary embolism. 2. No acute findings. 3. Increased anterior mediastinal soft tissue. 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 Recommend follow-up chest CT in 3-6 months to assess for stability. 4. Skeletal changes consistent with renal osteodystrophy. Aortic Atherosclerosis (ICD10-I70.0). Electronically Signed   By: DLajean ManesM.D.   On: 09/20/2022 08:19   CT ABDOMEN PELVIS WO CONTRAST  Result Date: 09/20/2022 CLINICAL DATA:  61year old female with left lower quadrant pain. Chronic diarrhea. EXAM: CT ABDOMEN AND PELVIS WITHOUT CONTRAST TECHNIQUE: Multidetector CT imaging of the abdomen and pelvis was performed following the standard protocol without IV contrast. RADIATION DOSE REDUCTION: This exam was performed according to the departmental dose-optimization program which includes automated exposure control, adjustment of the mA and/or kV according to patient size and/or use of iterative reconstruction technique. COMPARISON:  CTA chest 05/07/2021. FINDINGS: Lower chest: Substantially more normal lung bases compared to the CTA last year, mild lung base atelectasis or scarring now. Small to moderate gastric hiatal hernia. No pericardial or pleural effusion. Hepatobiliary: Noncontrast  liver appears stable with unchanged small right hepatic lobe circumscribed low-density benign cyst or hemangioma series 3, image 20 (no follow-up imaging recommended). Negative gallbladder. Pancreas: Pancreatic body and tail appear relatively normal. Pancreatic head and uncinate demonstrate multiple dystrophic calcifications. No obvious pancreatic mass or ductal dilatation. No inflammation. Spleen: Negative. Adrenals/Urinary Tract: Normal adrenal glands. Bilateral renal atrophy.  Multiple small frequently exophytic and benign appearing renal cysts (no follow-up imaging recommended). Mild bilateral nephrocalcinosis. No hydronephrosis, hydroureter, pararenal inflammation. Completely decompressed bladder. Incidental pelvic phleboliths. Stomach/Bowel: Redundant but nondilated large bowel. There is fluid in the it right colon and transverse colon, but formed stool from the splenic flexure distally. No large bowel inflammation is identified, the appendix is normal on coronal image 30. Terminal ileum is fairly decompressed and appears negative. No dilated small bowel. Gastric hiatal hernia. Otherwise negative stomach and duodenum. No free air or free fluid identified. Vascular/Lymphatic: Normal caliber abdominal aorta. Aortoiliac calcified atherosclerosis. Vascular patency is not evaluated in the absence of IV contrast. No lymphadenopathy identified. Reproductive: Very bulky fibroid uterus with multiple subserosal calcified uterine fibroids measuring between 5 and 7 cm diameter. Negative noncontrast adnexa. Other: No pelvic free fluid. Numerous pelvic vascular calcifications. Musculoskeletal: Diffusely abnormal bone mineralization, similar to the CTA last year. Favor renal osteodystrophy. Widespread chronic appearing vertebral endplate deformities. No destructive osseous lesion identified. IMPRESSION: 1. Fluid in the right and transverse colon which may indicate enteritis/diarrhea. Retained stool in the distal colon. Normal  appendix. 2. No other acute or inflammatory process identified in the non-contrast abdomen or pelvis. But other notable findings include: Bilateral renal atrophy, renal osteodystrophy. Chronic calcific pancreatitis. Very bulky fibroid uterus. Small to moderate gastric hiatal hernia. Aortic Atherosclerosis (ICD10-I70.0). Electronically Signed   By: Genevie Ann M.D.   On: 09/20/2022 05:12   CT HEAD WO CONTRAST  Result Date: 09/20/2022 CLINICAL DATA:  Head trauma, GCS=15, loss of consciousness (LOC) (Ped 0-17y) EXAM: CT HEAD WITHOUT CONTRAST TECHNIQUE: Contiguous axial images were obtained from the base of the skull through the vertex without intravenous contrast. RADIATION DOSE REDUCTION: This exam was performed according to the departmental dose-optimization program which includes automated exposure control, adjustment of the mA and/or kV according to patient size and/or use of iterative reconstruction technique. COMPARISON:  None Available. FINDINGS: Brain: Normal anatomic configuration. Parenchymal volume loss is commensurate with the patient's age. Moderate periventricular white matter changes are present likely reflecting the sequela of small vessel ischemia. No abnormal intra or extra-axial mass lesion or fluid collection. No abnormal mass effect or midline shift. No evidence of acute intracranial hemorrhage or infarct. Ventricular size is normal. Cerebellum unremarkable. Vascular: No asymmetric hyperdense vasculature at the skull base. Skull: Intact Sinuses/Orbits: Paranasal sinuses are clear. Orbits are unremarkable. Other: Mastoid air cells and middle ear cavities are clear. IMPRESSION: 1. No acute intracranial abnormality. No calvarial fracture. 2. Moderate senescent change. Electronically Signed   By: Fidela Salisbury M.D.   On: 09/20/2022 02:43   DG Chest Port 1 View  Result Date: 09/20/2022 CLINICAL DATA:  Syncope. EXAM: PORTABLE CHEST 1 VIEW COMPARISON:  07/31/2022. FINDINGS: The heart size and  mediastinal contours are within normal limits. Hyperinflation of the lungs is noted. Mild atelectasis or scarring is noted at the lung bases bilaterally. No effusion or pneumothorax. Surgical clips are present in the cervical soft tissues on the right. No acute osseous abnormality. IMPRESSION: Hyperinflation of the lungs with mild atelectasis or scarring at the lung bases. Electronically Signed   By: Brett Fairy M.D.   On: 09/20/2022 02:11   Medications: Infusions:  sodium chloride 10 mL/hr at 09/20/22 1900   norepinephrine (LEVOPHED) Adult infusion      Scheduled Medications:  aspirin EC  81 mg Oral Daily   Chlorhexidine Gluconate Cloth  6 each Topical Daily   heparin  5,000 Units Subcutaneous Q8H   midodrine  5 mg Oral Q8H   simvastatin  20 mg Oral QHS    have reviewed scheduled and prn medications.  Physical Exam: General:  NAD  Heart: RRR Lungs: clear Abdomen: soft, non tender Extremities: no edema Dialysis Access: right upper arm AVG-  patent     09/21/2022,10:34 AM  LOS: 1 day

## 2022-09-21 NOTE — Progress Notes (Signed)
   09/21/22 1748  Vitals  BP 111/70  MAP (mmHg) 84  Patient Position (if appropriate) Orthostatic Vitals  ECG Heart Rate 79  Resp 19  MEWS COLOR  MEWS Score Color Green  Orthostatic Lying   BP- Lying 111/70  Pulse- Lying 79  Orthostatic Sitting  BP- Sitting (!) 144/108  Pulse- Sitting 79  Orthostatic Standing at 0 minutes  BP- Standing at 0 minutes (!) 196/140  Pulse- Standing at 0 minutes 83  Orthostatic Standing at 3 minutes  BP- Standing at 3 minutes (!) 181/140  Pulse- Standing at 3 minutes 85  MEWS Score  MEWS Temp 0  MEWS Systolic 0  MEWS Pulse 0  MEWS RR 0  MEWS LOC 0  MEWS Score 0   Obtained orthostatic vital signs.  Unable to place BP on left or right arm d/t fistula and IV sites. Therefor BP cuff was placed on the patient's leg and BP's may be inaccurate. Patient did not complain of feeling light headed or dizzy. She was able to stand and ambulate without and difficulty. HR remained stable.

## 2022-09-21 NOTE — Progress Notes (Signed)
NAME:  Cynthia Bray, MRN:  [id number], DOB:  1961-06-09, LOS: 1 ADMISSION DATE:  09/20/2022, CONSULTATION DATE:  09/20/22 REFERRING MD:  Dr. 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    Patient reports being in her normal state of health recently except for a brief dizzy spell 2 weeks ago that resolved after sitting down.  Some what of a poor historian, as she said she takes atenolol every day, last dose 12/7 but then states she only takes as needed but does not check her blood pressure at home.  Only on ASA 35m at home.  Last iHD 12/8.  She remembers her blood pressure being low throughout treatment but had a full treatment and thinks they took off around 2L.  Unclear what her dry weight is but denies any recent weight loss or gain.   Came home and was asymptomatic but got up to go the door and became dizzy and woke up on the floor.  She fell back hitting the back of her head on the right.  She denies any recent appetite changes, poor PO intake, vision changes, ha, sick contacts, fever, chills, chest pain, shortness of breath beyond her baseline exertional dyspnea walking up stairs, no cough, N/V or abd pain, leg swelling, recent travel, bleeding, or medication changes.  Still voids every other day, last void 12/8 without symptoms.    In ER, was afebrile, tachycardic into the 120s, normoxia, and hypotensive with SBP in the 70's.  Her mental status has remained intact despite low readings and generally asymptomatic other than some mild diffuse tenderness on abd exam, last BM this morning and reported looser than her normal.  Some difficulty obtaining accurate reading in ER as she is restricted on LUE and only [place] is in left UE so blood pressure readings have  been on left forearm, and LE readings lower than LUE readings.  Labs noted for sCr 6.5, K 4, iCa 1.14, alk phos 298 (chronically elevated), trop hs 33> 33, lactic acid 2.8, d-dimer 0.82, and unremarkable CBC. EKG nonacute, ST, CXR showing hyperinflation with mild basilar atelectasis vs scarring, CT a/p noted fluid in right and transverse colon which may reflect enteritis/ diarrhea with retained stool in the distal colon, but no other acute or inflammatory process noted.  Treated with 1.5L of NS without improvement therefore placed on peripheral norepinephrine.  CTA PE study pending.  PCCM called for admit.    Pertinent  Medical History  ESRD MWF (since 2002) via RUE AVF, HTN, combined HF (EF 25-30%), moderate MR, GERD, hyperparathyroidism s/p parathyroidectomy 7/23, hypocalcemia, failure to thrive   Never smoker Quit daily ETOH 234yrago Denies illicit drug use   Significant Hospital Events: Including procedures, antibiotic start and stop dates in addition to other pertinent events   12/9 admit for hypotension, syncopal episode on NE  Interim History / Subjective:  Echo much improved, EF now 60-65, G1DD, PASP 50 Off NE this am No complaints Sitting up in chair watching TV  Objective   Blood pressure 113/73, pulse 71, temperature 98.3 F (36.8 C), temperature source Oral, resp. rate 15, height _0  (1.727 m), weight 54.4 kg, SpO2 99 %.        Intake/Output Summary (Last 24 hours) at 09/21/2022  [place] filed at 09/21/2022 1200 Gross per 24 hour  Intake 2402.07 ml  Output --  Net 2402.07 ml   Filed Weights   09/20/22 0308  Weight: 54.4 kg   Examination: General:  Pleasant older female sitting in bedside recliner in NAD HEENT: MM pink/moist Neuro: Aox3, MAE CV: rr, NSR, no murmur PULM:  non labored, CTA GI: soft, bs+ Extremities: warm/dry, no LE edema, RUE AVF Skin: no rashes   Resolved Hospital Problem list    Assessment & Plan:   Hypotension related to  hypovolemia Syncope  - CTH neg, CTA PE neg, EKG non acute, Abd CT neg P:  - BP improved with fluids.  Dry weight being adjusted by nephrology.   If she continues to be symptomatic, possibly related to her elevated PASP now 50.   - off pressors since this am.  No complaints.  Will check orthostatic and ambulate to see how patient tolerates - given higher MAPs, will stop midodrine 25m and see how her BP trends to see if patient needs midodrine at discharge  - TSH wnl - remains afebrile, no clinical signs of infectious etiology - follow cultures  - cortisol wnl     Systolic and diastolic HF> now w/ EF recovered  HTN HLD - previous echo 7/22 with EF 25-30%, G2DD, RSVP 45.7, mild to moderate MR P:  - echo with much better EF now 60-65%, G1DD, PASP 50 - cont ASA - likely will not need home atenolol any more, can be followed up by outpt cardiology, f/b Dr. 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  [facility] will take over care as of 12/11, PCCM available as needed.    Labs   CBC: Recent Labs  Lab 09/20/22 0145 09/20/22 0239 09/20/22 1755 09/21/22 0612  WBC 7.4  --  8.4 6.0  NEUTROABS  --   --  5.1  --   HGB 13.1 15.0 11.5* 10.6*  HCT 41.7 44.0 36.9 33.2*  MCV 93.1  --  93.2 92.2  PLT  228  --  164 1163   Basic Metabolic Panel: Recent Labs  Lab 09/20/22 0145 09/20/22 0239 09/21/22 0612  NA 139 139 138  K 4.1 4.0 4.5  CL 96* 101 104  CO2 25  --  19*  GLUCOSE 117* 115* 96  BUN 19 21 35*  CREATININE 5.95* 6.50* 8.07*  CALCIUM 10.7*  --  8.9   GFR: Estimated Creatinine Clearance: 6.2 mL/min (A) (by C-G formula based on SCr of 8.07 mg/dL (H)). Recent Labs  Lab 09/20/22 0127 09/20/22 0145 09/20/22 0506 09/20/22 1755 09/21/22 0612  WBC  --  7.4  --  8.4 6.0  LATICACIDVEN 2.0*  --  2.8* 2.1*  --     Liver Function  Tests: Recent Labs  Lab 09/20/22 0150 09/21/22 0612  AST 20 13*  ALT 10 9  ALKPHOS 298* 221*  BILITOT 0.6 0.7  PROT 8.9* 6.3*  ALBUMIN 4.2 2.8*   Recent Labs  Lab 09/20/22 0150 09/20/22 1755  LIPASE 43 41   No results for input(s): "AMMONIA" in the last 168 hours.  ABG    Component Value Date/Time   HCO3 28.1 (H) 05/07/2021 1851   TCO2 29 09/20/2022 0239   O2SAT 15.7 05/07/2021 1851     Coagulation Profile: No results for input(s): "INR", "PROTIME" in the last 168 hours.  Cardiac Enzymes: No results for input(s): "CKTOTAL", "CKMB", "CKMBINDEX", "TROPONINI" in the last 168 hours.  HbA1C: No results found for: "HGBA1C"  CBG: Recent Labs  Lab 09/20/22 0158  GLUCAP 113*    Critical care time: n/a     Kennieth Rad, MSN, AG-ACNP-BC [facility] 09/21/2022, 2:57 PM  See Amion for pager If no response to pager, please call PCCM consult pager After 7:00 pm call Elink

## 2022-09-22 DIAGNOSIS — E861 Hypovolemia: Secondary | ICD-10-CM | POA: Diagnosis not present

## 2022-09-22 DIAGNOSIS — I9589 Other hypotension: Secondary | ICD-10-CM

## 2022-09-22 LAB — BASIC METABOLIC PANEL
Anion gap: 14 (ref 5–15)
BUN: 56 mg/dL — ABNORMAL HIGH (ref 8–23)
CO2: 19 mmol/L — ABNORMAL LOW (ref 22–32)
Calcium: 8.5 mg/dL — ABNORMAL LOW (ref 8.9–10.3)
Chloride: 105 mmol/L (ref 98–111)
Creatinine, Ser: 10.22 mg/dL — ABNORMAL HIGH (ref 0.44–1.00)
GFR, Estimated: 4 mL/min — ABNORMAL LOW (ref >60)
Glucose, Bld: 82 mg/dL (ref 70–99)
Potassium: 4.7 mmol/L (ref 3.5–5.1)
Sodium: 138 mmol/L (ref 135–145)

## 2022-09-22 NOTE — Progress Notes (Signed)
Patient was scheduled to d'c today after dialysis. Patient has not had her dialysis she's scheduled to be dialysized sometime tonight. Patient opted to leave tomorrow morning.

## 2022-09-22 NOTE — Progress Notes (Addendum)
Contacted by inpt HD unit this morning with request to investigate pt receiving out-pt HD later today at pt's clinic due to pt's d/c today. Contacted [facility] and spoke to [place]. Pt can receive out-pt HD treatment today if pt arrives by 12:30. Spoke to pt via phone regarding the above. Pt agreeable to plan and aware to proceed to out-pt HD clinic by 12:30 for today's treatment. Pt states that son can transport to HD clinic at d/c from hospital and can take pt home after HD as well. Pt states that her son will be here within 30 min to 1 hr. Update provided attending, renal PA, RN CM, and pt's RN. Clinic advised pt should arrive by 12:30 and renal PA to send orders. Update provided to inpt HD unit as well.   Melven Sartorius Renal Navigator [phone]  Addendum at 11:15: Pt will require inpt HD today prior to d/c to ensure a safe discharge per providers. Update provided to pt, Inpt HD, and out-pt HD unit.

## 2022-09-22 NOTE — Progress Notes (Signed)
Patient was transferred to [facility] service today from [facility] service. Nephrology has cleared for discharge today. Nephrology planning for dialysis today before discharge.  I did reach  to [facility] service.  PCCM service will do the discharge summary and orders.We will sign off

## 2022-09-22 NOTE — Progress Notes (Signed)
On call MD notified.

## 2022-09-22 NOTE — Plan of Care (Signed)

## 2022-09-22 NOTE — Progress Notes (Signed)
   09/22/22 1100  Clinical Encounter Type  Visited With Patient and family together  Visit Type Initial;Other (Comment);Spiritual support (Advanced Directive)  Referral From Nurse  Consult/Referral To Chaplain   Chaplain responded to a spiritual consult for prayer and for advanced directive information.  I visited with the patient Caia and her son [name]. Spent time in prayer for healing and strength as well as going home today. I went over the advanced directive documents so that when she is ready to fill the paperwork out she is able. I also made suggestions for notary support.  Rosealie had a calmness about her that was welcoming and allowed her to listen and absorb the information I was sharing.   Danice Goltz [facility]  [phone]

## 2022-09-22 NOTE — Discharge Summary (Signed)
Physician Discharge Summary  Ioana Louks TZG:[id number] DOB: May 16, 1961 DOA: 09/20/2022  PCP: Camillia Herter, NP  Admit date: 09/20/2022 Discharge date: 09/22/2022  Admitted From: Home Disposition:  Home  Discharge Condition:Stable CODE STATUS:FULL Diet recommendation: renal   Brief/Interim Summary: Patient is a 61 year old female with history of ESRD on dialysis on Monday, Wednesday, Friday, hypertension, chronic diastolic/systolic heart failure with previous EF of 25 to 30%, moderate MR, GERD, hyperthyroidism, who presented to the emergency department after syncopal episode at home.  Patient reported feeling dizzy.  Last hemodialysis was on 12/8.  On presentation, patient was tachycardic with heart rate in the range of 120s, blood pressure was in the range of 70s.  Patient was admitted under PCCM service.  Started on fluids, pressures. Currently hemodynamically stable. Patient transferred to [facility] service on 12/11.  Nephrology cleared for discharge.  She remains comfortable this afternoon.  Blood pressure is stable, ambulating in the room without any problems.  Plan for discharge today after dialysis.  Following problems were addressed during her hospitalization:  Hypotension/syncopal: Thought to be secondary to hypovolemia.  Currently normotensive.  Blood pressure improved with IV fluids.  Was briefly in peripheral pressures.  She was also briefly started on midodrine and she may not need that on discharge.  Off pressures. Orthostatic vitals negative. Cortisol level within normal limit.   Chronic combined systolic/diastolic CHF: Previous echo as per 7/22 was 25 to 30% with grade 2 diastolic dysfunction.  Recent echo shows improvement with EF of 75%, grade 1 diastolic dysfunction.  On aspirin. Taking atenolol at home which will be  discontinued on discharge.  Follows with Dr. Oval Linsey, cardiology   ESRD on dialysis: Dialyzed on Monday, Wednesday, Friday.  Nephrology planning for dialysis  today before discharge. Also has metabolic bone disease with hyperparathyroidism.  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  Recommended to follow-up CT in 3 to 6 months.   Discharge Diagnoses:  Principal Problem:   Hypotension    Discharge Instructions  Discharge Instructions     Diet - low sodium heart healthy   Complete by: As directed    Discharge instructions   Complete by: As directed    Please follow up with you PCP in a week   Increase activity slowly   Complete by: As directed       Allergies as of 09/22/2022       Reactions   Sensipar [cinacalcet] Nausea And Vomiting   Aplisol [tuberculin, Ppd] Swelling   Lisinopril Swelling   Angioedema   Penicillin G Swelling        Medication List     STOP taking these medications    atenolol 25 MG tablet Commonly known as: TENORMIN       TAKE these medications    acetaminophen 500 MG tablet Commonly known as: TYLENOL Take 1,000 mg by mouth daily as needed for mild pain, moderate pain, fever or headache.   aspirin EC 81 MG tablet Take 81 mg by mouth in the morning.   calcitRIOL 0.5 MCG capsule Commonly known as: ROCALTROL Take 8 capsules (4 mcg total) by mouth daily. What changed: when to take this   Calcium Acetate 668 (169 Ca) MG Tabs Take 2,004 mg by mouth with breakfast, with lunch, and with evening meal.   calcium carbonate 500 MG chewable tablet Commonly known as: Tums Chew 5 tablets (1,000 mg of  elemental calcium total) by mouth 3 (three) times daily. Take 5 tabs three times daily between meals What changed:  how much to take additional instructions   meclizine 25 MG tablet Commonly known as: ANTIVERT Take 25 mg by mouth daily as needed for dizziness.   multivitamin Tabs tablet Take 1 tablet by mouth at bedtime. What changed: when to take this    simvastatin 20 MG tablet Commonly known as: ZOCOR Take 20 mg by mouth in the morning.   Velphoro 500 MG chewable tablet Generic drug: sucroferric oxyhydroxide Chew 500 mg by mouth 3 (three) times daily with meals.        Follow-up Information     [facility]. Go on 09/22/2022.   Why: Please arrive by 12:30 for today's appointment. Contact information: [place] [address] [phone]         Camillia Herter, NP. Schedule an appointment as soon as possible for a visit in 1 week(s).   Specialty: Nurse Practitioner Contact information: [place] [address] [phone]                Allergies  Allergen Reactions   Sensipar [Cinacalcet] Nausea And Vomiting   Aplisol [Tuberculin, Ppd] Swelling   Lisinopril Swelling    Angioedema    Penicillin G Swelling    Consultations: Nephrology, PCCM   Procedures/Studies: ECHOCARDIOGRAM COMPLETE  Result Date: 09/20/2022    ECHOCARDIOGRAM REPORT   Patient Name:   DUSTINA SCOGGIN Date of Exam: 09/20/2022 Medical Rec #:  [id number]     Height:       68.0 in Accession #:    [id number]    Weight:       120.0 lb Date of Birth:  08-Jun-1961    BSA:          1.645 m Patient Age:    31 years      BP:           96/64 mmHg Patient Gender: F             HR:           86 bpm. Exam Location:  Inpatient Procedure: 2D Echo, Cardiac Doppler and Color Doppler Indications:    Syncope  History:        Patient has prior history of Echocardiogram examinations, most                 recent 05/08/2021. CHF and Cardiomyopathy, CAD,                 Arrythmias:Tachycardia, Signs/Symptoms:Syncope; Risk                 Factors:Hypertension and Dyslipidemia. ESRD on Dialysis.  Sonographer:    Wenda Low Referring Phys: [place]  1. Left ventricular ejection fraction, by estimation, is 60 to 65%. The left ventricle has normal function. The left ventricle has no  regional wall motion abnormalities. Left ventricular diastolic parameters are consistent with Grade I diastolic dysfunction (impaired relaxation).  2. Right ventricular systolic function is normal. The right ventricular size is normal. There is moderately elevated pulmonary artery systolic pressure.  3. The mitral valve is degenerative. Trivial mitral valve regurgitation.  4. The aortic valve is tricuspid. There is mild thickening of the aortic valve. Aortic valve regurgitation is trivial. Aortic valve sclerosis is present, with no evidence of aortic valve stenosis.  5. The inferior vena cava is normal in  size with greater than 50% respiratory variability, suggesting right atrial pressure of 3 mmHg. Comparison(s): Prior images reviewed side by side. Compared to prior TTE on 04/2021, the LVEF has improved significantly from 25-30% to 60-65%. FINDINGS  Left Ventricle: Left ventricular ejection fraction, by estimation, is 60 to 65%. The left ventricle has normal function. The left ventricle has no regional wall motion abnormalities. The left ventricular internal cavity size was normal in size. There is  no left ventricular hypertrophy. Left ventricular diastolic parameters are consistent with Grade I diastolic dysfunction (impaired relaxation). Right Ventricle: The right ventricular size is normal. No increase in right ventricular wall thickness. Right ventricular systolic function is normal. There is moderately elevated pulmonary artery systolic pressure. The tricuspid regurgitant velocity is 3.26 m/s, and with an assumed right atrial pressure of 8 mmHg, the estimated right ventricular systolic pressure is 81.1 mmHg. Left Atrium: Left atrial size was normal in size. Right Atrium: Right atrial size was normal in size. Pericardium: There is no evidence of pericardial effusion. Mitral Valve: The mitral valve is degenerative in appearance. There is mild thickening of the mitral valve leaflet(s). There is mild  calcification of the mitral valve leaflet(s). Mild mitral annular calcification. Trivial mitral valve regurgitation. MV peak gradient, 5.8 mmHg. The mean mitral valve gradient is 2.0 mmHg. Tricuspid Valve: The tricuspid valve is normal in structure. Tricuspid valve regurgitation is mild. Aortic Valve: The aortic valve is tricuspid. There is mild thickening of the aortic valve. Aortic valve regurgitation is trivial. Aortic valve sclerosis is present, with no evidence of aortic valve stenosis. Aortic valve mean gradient measures 7.0 mmHg. Aortic valve peak gradient measures 11.7 mmHg. Aortic valve area, by VTI measures 1.99 cm. Pulmonic Valve: The pulmonic valve was normal in structure. Pulmonic valve regurgitation is trivial. Aorta: The aortic root is normal in size and structure. Venous: The inferior vena cava is normal in size with greater than 50% respiratory variability, suggesting right atrial pressure of 3 mmHg. IAS/Shunts: The interatrial septum is aneurysmal. The atrial septum is grossly normal.  LEFT VENTRICLE PLAX 2D LVIDd:         4.20 cm   Diastology LVIDs:         2.90 cm   LV e' medial:    5.98 cm/s LV PW:         1.00 cm   LV E/e' medial:  15.9 LV IVS:        0.90 cm   LV e' lateral:   7.72 cm/s LVOT diam:     1.90 cm   LV E/e' lateral: 12.3 LV SV:         70 LV SV Index:   43 LVOT Area:     2.84 cm  RIGHT VENTRICLE RV Basal diam:  3.50 cm RV Mid diam:    2.40 cm RV S prime:     14.70 cm/s TAPSE (M-mode): 2.2 cm LEFT ATRIUM             Index        RIGHT ATRIUM           Index LA diam:        3.20 cm 1.95 cm/m   RA Area:     15.10 cm LA Vol (A2C):   45.7 ml 27.78 ml/m  RA Volume:   41.90 ml  25.47 ml/m LA Vol (A4C):   31.8 ml 19.33 ml/m LA Biplane Vol: 40.4 ml 24.56 ml/m  AORTIC VALVE  PULMONIC VALVE AV Area (Vmax):    2.01 cm      PV Vmax:       1.14 m/s AV Area (Vmean):   1.74 cm      PV Peak grad:  5.2 mmHg AV Area (VTI):     1.99 cm AV Vmax:           171.00 cm/s AV  Vmean:          124.000 cm/s AV VTI:            0.353 m AV Peak Grad:      11.7 mmHg AV Mean Grad:      7.0 mmHg LVOT Vmax:         121.00 cm/s LVOT Vmean:        76.100 cm/s LVOT VTI:          0.248 m LVOT/AV VTI ratio: 0.70  AORTA Ao Root diam: 3.20 cm MITRAL VALVE               TRICUSPID VALVE MV Area (PHT): 3.20 cm    TR Peak grad:   42.5 mmHg MV Area VTI:   2.36 cm    TR Vmax:        326.00 cm/s MV Peak grad:  5.8 mmHg MV Mean grad:  2.0 mmHg    SHUNTS MV Vmax:       1.20 m/s    Systemic VTI:  0.25 m MV Vmean:      69.2 cm/s   Systemic Diam: 1.90 cm MV Decel Time: 237 msec MV E velocity: 95.10 cm/s MV A velocity: 99.40 cm/s MV E/A ratio:  0.96 Gwyndolyn Kaufman MD Electronically signed by Gwyndolyn Kaufman MD Signature Date/Time: 09/20/2022/11:28:23 AM    Final    CT Angio Chest PE W/Cm &/Or Wo Cm  Result Date: 09/20/2022 CLINICAL DATA:  Syncopal episode. Patient struck the back of her head. Patient is hypotensive. Positive D-dimer. EXAM: CT ANGIOGRAPHY CHEST WITH CONTRAST TECHNIQUE: Multidetector CT imaging of the chest was performed using the standard protocol during bolus administration of intravenous contrast. Multiplanar CT image reconstructions and MIPs were obtained to evaluate the vascular anatomy. RADIATION DOSE REDUCTION: This exam was performed according to the departmental dose-optimization program which includes automated exposure control, adjustment of the mA and/or kV according to patient size and/or use of iterative reconstruction technique. CONTRAST:  37m OMNIPAQUE IOHEXOL 350 MG/ML SOLN COMPARISON:  05/07/2021. FINDINGS: Cardiovascular: Pulmonary arteries are well opacified. There is no evidence of a pulmonary embolism. Heart is normal in size and configuration. Three-vessel coronary artery calcifications. No pericardial effusion. Mild dilation of the distal aortic arch to 3.4 cm. Mild aortic atherosclerosis. No dissection. Mediastinum/Nodes: Soft tissue in the anterior mediastinum  measuring approximately 6 x 1.5 x 3.3 cm, increased when compared to the prior CT. No other mediastinal mass or evidence of lymphadenopathy. Trachea and esophagus are unremarkable. Lungs/Pleura: Mild linear subsegmental atelectasis in the lung bases and dependent lower lobes. Remainder of the lungs is clear. No pleural effusion or pneumothorax. Upper Abdomen: No acute abnormality. Musculoskeletal: Diffuse skeletal sclerosis unchanged from the prior study. No destructive bone lesions. No chest wall mass. Review of the MIP images confirms the above findings. IMPRESSION: 1. No evidence of a pulmonary embolism. 2. No acute findings. 3. Increased anterior mediastinal soft tissue. 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 Recommend follow-up chest CT in 3-6 months to assess for stability. 4. Skeletal changes consistent with renal osteodystrophy. Aortic Atherosclerosis (ICD10-I70.0). Electronically Signed  By: Lajean Manes M.D.   On: 09/20/2022 08:19   CT ABDOMEN PELVIS WO CONTRAST  Result Date: 09/20/2022 CLINICAL DATA:  61 year old female with left lower quadrant pain. Chronic diarrhea. EXAM: CT ABDOMEN AND PELVIS WITHOUT CONTRAST TECHNIQUE: Multidetector CT imaging of the abdomen and pelvis was performed following the standard protocol without IV contrast. RADIATION DOSE REDUCTION: This exam was performed according to the departmental dose-optimization program which includes automated exposure control, adjustment of the mA and/or kV according to patient size and/or use of iterative reconstruction technique. COMPARISON:  CTA chest 05/07/2021. FINDINGS: Lower chest: Substantially more normal lung bases compared to the CTA last year, mild lung base atelectasis or scarring now. Small to moderate gastric hiatal hernia. No pericardial or pleural effusion. Hepatobiliary: Noncontrast liver appears stable with unchanged small right hepatic lobe circumscribed low-density benign cyst or hemangioma series 3, image 20 (no  follow-up imaging recommended). Negative gallbladder. Pancreas: Pancreatic body and tail appear relatively normal. Pancreatic head and uncinate demonstrate multiple dystrophic calcifications. No obvious pancreatic mass or ductal dilatation. No inflammation. Spleen: Negative. Adrenals/Urinary Tract: Normal adrenal glands. Bilateral renal atrophy. Multiple small frequently exophytic and benign appearing renal cysts (no follow-up imaging recommended). Mild bilateral nephrocalcinosis. No hydronephrosis, hydroureter, pararenal inflammation. Completely decompressed bladder. Incidental pelvic phleboliths. Stomach/Bowel: Redundant but nondilated large bowel. There is fluid in the it right colon and transverse colon, but formed stool from the splenic flexure distally. No large bowel inflammation is identified, the appendix is normal on coronal image 30. Terminal ileum is fairly decompressed and appears negative. No dilated small bowel. Gastric hiatal hernia. Otherwise negative stomach and duodenum. No free air or free fluid identified. Vascular/Lymphatic: Normal caliber abdominal aorta. Aortoiliac calcified atherosclerosis. Vascular patency is not evaluated in the absence of IV contrast. No lymphadenopathy identified. Reproductive: Very bulky fibroid uterus with multiple subserosal calcified uterine fibroids measuring between 5 and 7 cm diameter. Negative noncontrast adnexa. Other: No pelvic free fluid. Numerous pelvic vascular calcifications. Musculoskeletal: Diffusely abnormal bone mineralization, similar to the CTA last year. Favor renal osteodystrophy. Widespread chronic appearing vertebral endplate deformities. No destructive osseous lesion identified. IMPRESSION: 1. Fluid in the right and transverse colon which may indicate enteritis/diarrhea. Retained stool in the distal colon. Normal appendix. 2. No other acute or inflammatory process identified in the non-contrast abdomen or pelvis. But other notable findings  include: Bilateral renal atrophy, renal osteodystrophy. Chronic calcific pancreatitis. Very bulky fibroid uterus. Small to moderate gastric hiatal hernia. Aortic Atherosclerosis (ICD10-I70.0). Electronically Signed   By: Genevie Ann M.D.   On: 09/20/2022 05:12   CT HEAD WO CONTRAST  Result Date: 09/20/2022 CLINICAL DATA:  Head trauma, GCS=15, loss of consciousness (LOC) (Ped 0-17y) EXAM: CT HEAD WITHOUT CONTRAST TECHNIQUE: Contiguous axial images were obtained from the base of the skull through the vertex without intravenous contrast. RADIATION DOSE REDUCTION: This exam was performed according to the departmental dose-optimization program which includes automated exposure control, adjustment of the mA and/or kV according to patient size and/or use of iterative reconstruction technique. COMPARISON:  None Available. FINDINGS: Brain: Normal anatomic configuration. Parenchymal volume loss is commensurate with the patient's age. Moderate periventricular white matter changes are present likely reflecting the sequela of small vessel ischemia. No abnormal intra or extra-axial mass lesion or fluid collection. No abnormal mass effect or midline shift. No evidence of acute intracranial hemorrhage or infarct. Ventricular size is normal. Cerebellum unremarkable. Vascular: No asymmetric hyperdense vasculature at the skull base. Skull: Intact Sinuses/Orbits: Paranasal sinuses are clear. Orbits are unremarkable.  Other: Mastoid air cells and middle ear cavities are clear. IMPRESSION: 1. No acute intracranial abnormality. No calvarial fracture. 2. Moderate senescent change. Electronically Signed   By: Fidela Salisbury M.D.   On: 09/20/2022 02:43   DG Chest Port 1 View  Result Date: 09/20/2022 CLINICAL DATA:  Syncope. EXAM: PORTABLE CHEST 1 VIEW COMPARISON:  07/31/2022. FINDINGS: The heart size and mediastinal contours are within normal limits. Hyperinflation of the lungs is noted. Mild atelectasis or scarring is noted at the lung  bases bilaterally. No effusion or pneumothorax. Surgical clips are present in the cervical soft tissues on the right. No acute osseous abnormality. IMPRESSION: Hyperinflation of the lungs with mild atelectasis or scarring at the lung bases. Electronically Signed   By: Brett Fairy M.D.   On: 09/20/2022 02:11      Subjective: Patient seen and examined at the bedside this afternoon.  Hemodynamically stable.  Sitting in the chair.  Comfortable, blood pressure has been stable, she is ambulating without any problems.  She is waiting for dialysis today before discharge  Discharge Exam: Vitals:   09/22/22 0820 09/22/22 1341  BP: 108/64 134/73  Pulse: 97 84  Resp: 20 15  Temp: 98.4 F (36.9 C) 97.8 F (36.6 C)  SpO2: 100% (!) 89%   Vitals:   09/22/22 0430 09/22/22 0611 09/22/22 0820 09/22/22 1341  BP: (!) 103/54  108/64 134/73  Pulse: 80  97 84  Resp: '15  20 15  '$ Temp: 98 F (36.7 C)  98.4 F (36.9 C) 97.8 F (36.6 C)  TempSrc:   Oral   SpO2: 98%  100% (!) 89%  Weight:  60.9 kg    Height:        General: Pt is alert, awake, not in acute distress Cardiovascular: RRR, S1/S2 +, no rubs, no gallops Respiratory: CTA bilaterally, no wheezing, no rhonchi Abdominal: Soft, NT, ND, bowel sounds + Extremities: no edema, no cyanosis,av fistula on the right upper extremity    The results of significant diagnostics from this hospitalization (including imaging, microbiology, ancillary and laboratory) are listed below for reference.     Microbiology: Recent Results (from the past 240 hour(s))  Culture, blood (routine x 2)     Status: None (Preliminary result)   Collection Time: 09/20/22  5:03 AM   Specimen: BLOOD LEFT HAND  Result Value Ref Range Status   Specimen Description BLOOD LEFT HAND  Final   Special Requests   Final    BOTTLES DRAWN AEROBIC AND ANAEROBIC Blood Culture results may not be optimal due to an inadequate volume of blood received in culture bottles   Culture   Final     NO GROWTH 2 DAYS Performed at [facility], [place] [address], [place], [place] [address]    Report Status PENDING  Incomplete  MRSA Next Gen by PCR, Nasal     Status: None   Collection Time: 09/20/22  2:16 PM   Specimen: Nasal Mucosa; Nasal Swab  Result Value Ref Range Status   MRSA by PCR Next Gen NOT DETECTED NOT DETECTED Final    Comment: (NOTE) The GeneXpert MRSA Assay (FDA approved for NASAL specimens only), is one component of a comprehensive MRSA colonization surveillance program. It is not intended to diagnose MRSA infection nor to guide or monitor treatment for MRSA infections. Test performance is not FDA approved in patients less than 62 years old. Performed at [facility], [place] [address] [place], [place] [address]   Culture, blood (  routine x 2)     Status: None (Preliminary result)   Collection Time: 09/20/22  6:35 PM   Specimen: BLOOD LEFT HAND  Result Value Ref Range Status   Specimen Description BLOOD LEFT HAND  Final   Special Requests   Final    BOTTLES DRAWN AEROBIC ONLY Blood Culture results may not be optimal due to an inadequate volume of blood received in culture bottles   Culture   Final    NO GROWTH 2 DAYS Performed at [facility], [place] [address], [place], [place] [address]    Report Status PENDING  Incomplete     Labs: BNP (last 3 results) No results for input(s): "BNP" in the last 8760 hours. Basic Metabolic Panel: Recent Labs  Lab 09/20/22 0145 09/20/22 0239 09/21/22 0612 09/22/22 0424  NA 139 139 138 138  K 4.1 4.0 4.5 4.7  CL 96* 101 104 105  CO2 25  --  19* 19*  GLUCOSE 117* 115* 96 82  BUN 19 21 35* 56*  CREATININE 5.95* 6.50* 8.07* 10.22*  CALCIUM 10.7*  --  8.9 8.5*   Liver Function Tests: Recent Labs  Lab 09/20/22 0150 09/21/22 0612  AST 20 13*  ALT 10 9  ALKPHOS 298* 221*  BILITOT 0.6 0.7  PROT 8.9* 6.3*  ALBUMIN 4.2 2.8*   Recent Labs  Lab 09/20/22 0150 09/20/22 1755  LIPASE 43 41   No results  for input(s): "AMMONIA" in the last 168 hours. CBC: Recent Labs  Lab 09/20/22 0145 09/20/22 0239 09/20/22 1755 09/21/22 0612  WBC 7.4  --  8.4 6.0  NEUTROABS  --   --  5.1  --   HGB 13.1 15.0 11.5* 10.6*  HCT 41.7 44.0 36.9 33.2*  MCV 93.1  --  93.2 92.2  PLT 228  --  164 185   Cardiac Enzymes: No results for input(s): "CKTOTAL", "CKMB", "CKMBINDEX", "TROPONINI" in the last 168 hours. BNP: Invalid input(s): "POCBNP" CBG: Recent Labs  Lab 09/20/22 0158  GLUCAP 113*   D-Dimer Recent Labs    09/20/22 0506  DDIMER 0.82*   Hgb A1c No results for input(s): "HGBA1C" in the last 72 hours. Lipid Profile No results for input(s): "CHOL", "HDL", "LDLCALC", "TRIG", "CHOLHDL", "LDLDIRECT" in the last 72 hours. Thyroid function studies Recent Labs    09/20/22 0127  TSH 3.561   Anemia work up No results for input(s): "VITAMINB12", "FOLATE", "FERRITIN", "TIBC", "IRON", "RETICCTPCT" in the last 72 hours. Urinalysis No results found for: "COLORURINE", "APPEARANCEUR", "LABSPEC", "PHURINE", "GLUCOSEU", "HGBUR", "BILIRUBINUR", "KETONESUR", "PROTEINUR", "UROBILINOGEN", "NITRITE", "LEUKOCYTESUR" Sepsis Labs Recent Labs  Lab 09/20/22 0145 09/20/22 1755 09/21/22 0612  WBC 7.4 8.4 6.0   Microbiology Recent Results (from the past 240 hour(s))  Culture, blood (routine x 2)     Status: None (Preliminary result)   Collection Time: 09/20/22  5:03 AM   Specimen: BLOOD LEFT HAND  Result Value Ref Range Status   Specimen Description BLOOD LEFT HAND  Final   Special Requests   Final    BOTTLES DRAWN AEROBIC AND ANAEROBIC Blood Culture results may not be optimal due to an inadequate volume of blood received in culture bottles   Culture   Final    NO GROWTH 2 DAYS Performed at [facility], [place] [address] [place], [place] [address]    Report Status PENDING  Incomplete  MRSA Next Gen by PCR, Nasal     Status: None   Collection Time: 09/20/22  2:16 PM  Specimen: Nasal Mucosa;  Nasal Swab  Result Value Ref Range Status   MRSA by PCR Next Gen NOT DETECTED NOT DETECTED Final    Comment: (NOTE) The GeneXpert MRSA Assay (FDA approved for NASAL specimens only), is one component of a comprehensive MRSA colonization surveillance program. It is not intended to diagnose MRSA infection nor to guide or monitor treatment for MRSA infections. Test performance is not FDA approved in patients less than 85 years old. Performed at [facility], [place] [address], [place], [place] [address]   Culture, blood (routine x 2)     Status: None (Preliminary result)   Collection Time: 09/20/22  6:35 PM   Specimen: BLOOD LEFT HAND  Result Value Ref Range Status   Specimen Description BLOOD LEFT HAND  Final   Special Requests   Final    BOTTLES DRAWN AEROBIC ONLY Blood Culture results may not be optimal due to an inadequate volume of blood received in culture bottles   Culture   Final    NO GROWTH 2 DAYS Performed at [facility], [place] [address] [address] [place], [place] [address]    Report Status PENDING  Incomplete    Please note: You were cared for by a hospitalist during your hospital stay. Once you are discharged, your primary care physician will handle any further medical issues. Please note that NO REFILLS for any discharge medications will be authorized once you are discharged, as it is imperative that you return to your primary care physician (or establish a relationship with a primary care physician if you do not have one) for your post hospital discharge needs so that they can reassess your need for medications and monitor your lab values.    Time coordinating discharge: 40 minutes  SIGNED:   Shelly Coss, MD  [facility] 09/22/2022, 3:24 PM Pager [phone]  If 7PM-7AM, please contact night-coverage [email or web address] Password [email or web address]

## 2022-09-22 NOTE — Progress Notes (Signed)
[facility] Progress Note   Subjective:   Feeling well, says she sat up for several hours and walked around yesterday and felt fine, no dizziness. Orthostatic BP elevated yesterday with leg cuff.   Objective Vitals:   09/21/22 1938 09/22/22 0430 09/22/22 0611 09/22/22 0820  BP: 110/71 (!) 103/54  108/64  Pulse: 77 80  97  Resp: '19 15  20  '$ Temp: 98.2 F (36.8 C) 98 F (36.7 C)  98.4 F (36.9 C)  TempSrc:    Oral  SpO2: 100% 98%  100%  Weight:   60.9 kg   Height:       Physical Exam General: Alert female in NAD Heart: RRR, no murmurs, rubs or gallops Lungs: CTA bilaterally without wheezing, rhonchi or rales Abdomen: Soft, non-distended, +BS Extremities: No edema b/l lower extremities Dialysis Access: RUE AVG + bruit  Additional Objective Labs: Basic Metabolic Panel: Recent Labs  Lab 09/20/22 0145 09/20/22 0239 09/21/22 0612 09/22/22 0424  NA 139 139 138 138  K 4.1 4.0 4.5 4.7  CL 96* 101 104 105  CO2 25  --  19* 19*  GLUCOSE 117* 115* 96 82  BUN 19 21 35* 56*  CREATININE 5.95* 6.50* 8.07* 10.22*  CALCIUM 10.7*  --  8.9 8.5*   Liver Function Tests: Recent Labs  Lab 09/20/22 0150 09/21/22 0612  AST 20 13*  ALT 10 9  ALKPHOS 298* 221*  BILITOT 0.6 0.7  PROT 8.9* 6.3*  ALBUMIN 4.2 2.8*   Recent Labs  Lab 09/20/22 0150 09/20/22 1755  LIPASE 43 41   CBC: Recent Labs  Lab 09/20/22 0145 09/20/22 0239 09/20/22 1755 09/21/22 0612  WBC 7.4  --  8.4 6.0  NEUTROABS  --   --  5.1  --   HGB 13.1 15.0 11.5* 10.6*  HCT 41.7 44.0 36.9 33.2*  MCV 93.1  --  93.2 92.2  PLT 228  --  164 185   Blood Culture    Component Value Date/Time   SDES BLOOD LEFT HAND 09/20/2022 1835   SPECREQUEST  09/20/2022 1835    BOTTLES DRAWN AEROBIC ONLY Blood Culture results may not be optimal due to an inadequate volume of blood received in culture bottles   CULT  09/20/2022 1835    NO GROWTH < 24 HOURS Performed at [facility] [address],  [place], [place] [address]    REPTSTATUS PENDING 09/20/2022 1835    Cardiac Enzymes: No results for input(s): "CKTOTAL", "CKMB", "CKMBINDEX", "TROPONINI" in the last 168 hours. CBG: Recent Labs  Lab 09/20/22 0158  GLUCAP 113*   Iron Studies: No results for input(s): "IRON", "TIBC", "TRANSFERRIN", "FERRITIN" in the last 72 hours. '@lablastinr3'$ @ Studies/Results: ECHOCARDIOGRAM COMPLETE  Result Date: 09/20/2022    ECHOCARDIOGRAM REPORT   Patient Name:   RISSA TURLEY Date of Exam: 09/20/2022 Medical Rec #:  [id number]     Height:       68.0 in Accession #:    [id number]    Weight:       120.0 lb Date of Birth:  07-05-1961    BSA:          1.645 m Patient Age:    61 years      BP:           96/64 mmHg Patient Gender: F             HR:           86 bpm. Exam Location:  Inpatient  Procedure: 2D Echo, Cardiac Doppler and Color Doppler Indications:    Syncope  History:        Patient has prior history of Echocardiogram examinations, most                 recent 05/08/2021. CHF and Cardiomyopathy, CAD,                 Arrythmias:Tachycardia, Signs/Symptoms:Syncope; Risk                 Factors:Hypertension and Dyslipidemia. ESRD on Dialysis.  Sonographer:    Wenda Low Referring Phys: [place]  1. Left ventricular ejection fraction, by estimation, is 60 to 65%. The left ventricle has normal function. The left ventricle has no regional wall motion abnormalities. Left ventricular diastolic parameters are consistent with Grade I diastolic dysfunction (impaired relaxation).  2. Right ventricular systolic function is normal. The right ventricular size is normal. There is moderately elevated pulmonary artery systolic pressure.  3. The mitral valve is degenerative. Trivial mitral valve regurgitation.  4. The aortic valve is tricuspid. There is mild thickening of the aortic valve. Aortic valve regurgitation is trivial. Aortic valve sclerosis is present, with no evidence of aortic valve stenosis.   5. The inferior vena cava is normal in size with greater than 50% respiratory variability, suggesting right atrial pressure of 3 mmHg. Comparison(s): Prior images reviewed side by side. Compared to prior TTE on 04/2021, the LVEF has improved significantly from 25-30% to 60-65%. FINDINGS  Left Ventricle: Left ventricular ejection fraction, by estimation, is 60 to 65%. The left ventricle has normal function. The left ventricle has no regional wall motion abnormalities. The left ventricular internal cavity size was normal in size. There is  no left ventricular hypertrophy. Left ventricular diastolic parameters are consistent with Grade I diastolic dysfunction (impaired relaxation). Right Ventricle: The right ventricular size is normal. No increase in right ventricular wall thickness. Right ventricular systolic function is normal. There is moderately elevated pulmonary artery systolic pressure. The tricuspid regurgitant velocity is 3.26 m/s, and with an assumed right atrial pressure of 8 mmHg, the estimated right ventricular systolic pressure is 27.5 mmHg. Left Atrium: Left atrial size was normal in size. Right Atrium: Right atrial size was normal in size. Pericardium: There is no evidence of pericardial effusion. Mitral Valve: The mitral valve is degenerative in appearance. There is mild thickening of the mitral valve leaflet(s). There is mild calcification of the mitral valve leaflet(s). Mild mitral annular calcification. Trivial mitral valve regurgitation. MV peak gradient, 5.8 mmHg. The mean mitral valve gradient is 2.0 mmHg. Tricuspid Valve: The tricuspid valve is normal in structure. Tricuspid valve regurgitation is mild. Aortic Valve: The aortic valve is tricuspid. There is mild thickening of the aortic valve. Aortic valve regurgitation is trivial. Aortic valve sclerosis is present, with no evidence of aortic valve stenosis. Aortic valve mean gradient measures 7.0 mmHg. Aortic valve peak gradient measures 11.7  mmHg. Aortic valve area, by VTI measures 1.99 cm. Pulmonic Valve: The pulmonic valve was normal in structure. Pulmonic valve regurgitation is trivial. Aorta: The aortic root is normal in size and structure. Venous: The inferior vena cava is normal in size with greater than 50% respiratory variability, suggesting right atrial pressure of 3 mmHg. IAS/Shunts: The interatrial septum is aneurysmal. The atrial septum is grossly normal.  LEFT VENTRICLE PLAX 2D LVIDd:         4.20 cm   Diastology LVIDs:  2.90 cm   LV e' medial:    5.98 cm/s LV PW:         1.00 cm   LV E/e' medial:  15.9 LV IVS:        0.90 cm   LV e' lateral:   7.72 cm/s LVOT diam:     1.90 cm   LV E/e' lateral: 12.3 LV SV:         70 LV SV Index:   43 LVOT Area:     2.84 cm  RIGHT VENTRICLE RV Basal diam:  3.50 cm RV Mid diam:    2.40 cm RV S prime:     14.70 cm/s TAPSE (M-mode): 2.2 cm LEFT ATRIUM             Index        RIGHT ATRIUM           Index LA diam:        3.20 cm 1.95 cm/m   RA Area:     15.10 cm LA Vol (A2C):   45.7 ml 27.78 ml/m  RA Volume:   41.90 ml  25.47 ml/m LA Vol (A4C):   31.8 ml 19.33 ml/m LA Biplane Vol: 40.4 ml 24.56 ml/m  AORTIC VALVE                     PULMONIC VALVE AV Area (Vmax):    2.01 cm      PV Vmax:       1.14 m/s AV Area (Vmean):   1.74 cm      PV Peak grad:  5.2 mmHg AV Area (VTI):     1.99 cm AV Vmax:           171.00 cm/s AV Vmean:          124.000 cm/s AV VTI:            0.353 m AV Peak Grad:      11.7 mmHg AV Mean Grad:      7.0 mmHg LVOT Vmax:         121.00 cm/s LVOT Vmean:        76.100 cm/s LVOT VTI:          0.248 m LVOT/AV VTI ratio: 0.70  AORTA Ao Root diam: 3.20 cm MITRAL VALVE               TRICUSPID VALVE MV Area (PHT): 3.20 cm    TR Peak grad:   42.5 mmHg MV Area VTI:   2.36 cm    TR Vmax:        326.00 cm/s MV Peak grad:  5.8 mmHg MV Mean grad:  2.0 mmHg    SHUNTS MV Vmax:       1.20 m/s    Systemic VTI:  0.25 m MV Vmean:      69.2 cm/s   Systemic Diam: 1.90 cm MV Decel Time: 237 msec  MV E velocity: 95.10 cm/s MV A velocity: 99.40 cm/s MV E/A ratio:  0.96 Gwyndolyn Kaufman MD Electronically signed by Gwyndolyn Kaufman MD Signature Date/Time: 09/20/2022/11:28:23 AM    Final    Medications:  sodium chloride 10 mL/hr at 09/20/22 1900    aspirin EC  81 mg Oral Daily   Chlorhexidine Gluconate Cloth  6 each Topical Daily   Chlorhexidine Gluconate Cloth  6 each Topical Q0600   heparin  5,000 Units Subcutaneous Q8H   simvastatin  20 mg Oral QHS    Dialysis  Orders: MWF at [facility] 3:45hr, 350/500, EDW 56kg (recently ^d from 55.5kg), 2K/3Ca, R AVG, no heparin - Calcitriol 73mg PO q HD - no ESA - Hgb prev 12 range  Assessment/Plan: Syncope/hypotension: Head CT negative s/p fall. Negative work-up so far: EKG sinus tachycardia only. CXR clear. CT angio without PE. Abd CT negative. Echo with much improved/normalized EF (60-65%). Blood Cx 12/8 NGTD. TSH normal. No edema on exam. It very well may be that she is just incredibly dry - her dry weight could certainly use some raising - probably more up to 58kg range. Has already been given over 2 iters of fluid. Her next HD is not going to be until Monday - now off pressors. Not sure if leg cuff is giving accurate BP but she is not having any dizziness. No UF with HD today.   2.  ESRD: MWF schedule - next HD on 12/11. 3 Anemia: Hgb 10.6, has come down with fluids not surprisingly but still no ESA needed  4 Metabolic bone disease: Corrected calcium was high but improving. had been lower as outpatient since her PTX surgery earlier this year.    SAnice Paganini PA-C 09/22/2022, 9:29 AM  [facility] Pager: [phone]

## 2022-09-23 DIAGNOSIS — I959 Hypotension, unspecified: Secondary | ICD-10-CM | POA: Insufficient documentation

## 2022-09-23 DIAGNOSIS — R55 Syncope and collapse: Secondary | ICD-10-CM | POA: Insufficient documentation

## 2022-09-23 LAB — HEPATITIS B SURFACE ANTIBODY, QUANTITATIVE: Hep B S AB Quant (Post): 214.3 m[IU]/mL (ref >9.9)

## 2022-09-23 NOTE — Progress Notes (Signed)
Received patient in bed to unit.  Alert and oriented.  Informed consent signed and in chart.   Treatment initiated: 0930 Treatment completed: 1230  Patient tolerated well.  Transported back to the room  Alert, without acute distress.  Hand-off given to patient's nurse.   Access used: AVG Access issues: none  Total UF removed: 0 Medication(s) given: none Post HD VS: 129/70 P80 R 18 Post HD weight: 60.9 kg   [facility]

## 2022-09-23 NOTE — Progress Notes (Signed)
Patient seen in HD today, no complaints. She was discharged yesterday, supposed to go home after HD however her dialysis has been postponed until today. To go home post HD today. Please refer to dc summary by Dr Tawanna Solo 09/22/2022  Gearldene Fiorenza M. Cruzita Lederer, MD, PhD [facility]  Between 7 am - 7 pm you can contact me via Amion (for emergencies) or [place] (non urgent matters).  I am not available 7 pm - 7 am, please contact night coverage MD/APP via Amion

## 2022-09-23 NOTE — Progress Notes (Signed)
Pt d/c to home today. Contacted [facility] to advise clinic of pt's d/c today and that pt should resume care tomorrow. Pt had HD ordered for yesterday but it was rolled over to today.   Melven Sartorius Renal Navigator [phone]

## 2022-09-23 NOTE — Progress Notes (Signed)
[facility] Progress Note   Subjective:   Alert, NAD, denies SOB, CP dizziness and nausea.   Objective Vitals:   09/22/22 0820 09/22/22 1341 09/22/22 2059 09/23/22 0844  BP: 108/64 134/73 135/73 139/73  Pulse: 97 84 85 83  Resp: '20 15 18 18  '$ Temp: 98.4 F (36.9 C) 97.8 F (36.6 C) 97.7 F (36.5 C) 97.8 F (36.6 C)  TempSrc: Oral     SpO2: 100% (!) 89%  100%  Weight:      Height:       Physical Exam General: Alert female in NAD Heart: RRR, no murmurs, rubs or gallops Lungs: CTA bilaterally without wheezing, rhonchi or rales Abdomen: Soft, non-distended, +BS Extremities: No edema b/l lower extremities Dialysis Access:  RUE AVG + bruit  Additional Objective Labs: Basic Metabolic Panel: Recent Labs  Lab 09/20/22 0145 09/20/22 0239 09/21/22 0612 09/22/22 0424  NA 139 139 138 138  K 4.1 4.0 4.5 4.7  CL 96* 101 104 105  CO2 25  --  19* 19*  GLUCOSE 117* 115* 96 82  BUN 19 21 35* 56*  CREATININE 5.95* 6.50* 8.07* 10.22*  CALCIUM 10.7*  --  8.9 8.5*   Liver Function Tests: Recent Labs  Lab 09/20/22 0150 09/21/22 0612  AST 20 13*  ALT 10 9  ALKPHOS 298* 221*  BILITOT 0.6 0.7  PROT 8.9* 6.3*  ALBUMIN 4.2 2.8*   Recent Labs  Lab 09/20/22 0150 09/20/22 1755  LIPASE 43 41   CBC: Recent Labs  Lab 09/20/22 0145 09/20/22 0239 09/20/22 1755 09/21/22 0612  WBC 7.4  --  8.4 6.0  NEUTROABS  --   --  5.1  --   HGB 13.1 15.0 11.5* 10.6*  HCT 41.7 44.0 36.9 33.2*  MCV 93.1  --  93.2 92.2  PLT 228  --  164 185   Blood Culture    Component Value Date/Time   SDES BLOOD LEFT HAND 09/20/2022 1835   SPECREQUEST  09/20/2022 1835    BOTTLES DRAWN AEROBIC ONLY Blood Culture results may not be optimal due to an inadequate volume of blood received in culture bottles   CULT  09/20/2022 1835    NO GROWTH 2 DAYS Performed at [facility] [address] [place], [place] [address]    REPTSTATUS PENDING 09/20/2022 1835    CBG: Recent Labs   Lab 09/20/22 0158  GLUCAP 113*    Medications:  sodium chloride 10 mL/hr at 09/20/22 1900    aspirin EC  81 mg Oral Daily   Chlorhexidine Gluconate Cloth  6 each Topical Daily   Chlorhexidine Gluconate Cloth  6 each Topical Q0600   heparin  5,000 Units Subcutaneous Q8H   simvastatin  20 mg Oral QHS    Dialysis Orders: MWF at [facility] 3:45hr, 350/500, EDW 56kg (recently ^d from 55.5kg), 2K/3Ca, R AVG, no heparin - Calcitriol 46mg PO q HD - no ESA - Hgb prev 12 range  Assessment/Plan: Syncope/hypotension: Head CT negative s/p fall. Negative work-up so far: EKG sinus tachycardia only. CXR clear. CT angio without PE. Abd CT negative. Echo with much improved/normalized EF (60-65%). Blood Cx 12/8 NGTD. TSH normal. No edema on exam. It very well may be that she is just incredibly dry - her dry weight could certainly use some raising. Has already been given over 2 liters of fluid. Not sure if leg cuff is giving accurate BP but she is not having any dizziness. No UF with HD today.  2.  ESRD: MWF schedule - was supposed to have HD yesterday but treatment was rolled over to today, will be going to the unit shortly. 3 Anemia: Hgb 10.6, has come down with fluids not surprisingly but still no ESA needed  4 Metabolic bone disease: Corrected calcium was high but improving. had been lower as outpatient since her PTX surgery earlier this year.    Anice Paganini, PA-C 09/23/2022, 9:00 AM  [facility] Pager: [phone]

## 2022-09-24 ENCOUNTER — Telehealth: Payer: Self-pay

## 2022-09-24 ENCOUNTER — Telehealth: Payer: Self-pay | Admitting: Physician Assistant

## 2022-09-24 NOTE — Telephone Encounter (Signed)
Transition Care Management Unsuccessful Follow-up Telephone Call  Date of discharge and from where:  09/23/2022, [facility]  Attempts:  1st Attempt  Reason for unsuccessful TCM follow-up call:  Left voice message - [phone], call back requested.  Need to discuss scheduling a hospital follow up appointment with PCP

## 2022-09-24 NOTE — Telephone Encounter (Signed)
Transition of care contact from inpatient facility  Date of discharge: 09/22/22 Date of contact: 09/24/22 Method: Phone Spoke to: Patient's son  Patient contacted to discuss transition of care from recent inpatient hospitalization. Pt is currently at HD, doing well per her son.   Medication changes were reviewed: No changes  Patient will follow up with his/her outpatient HD unit on: 09/24/22  Anice Paganini, PA-C 09/24/2022, 2:39 PM  [facility] Pager: [phone]

## 2022-09-25 ENCOUNTER — Telehealth: Payer: Self-pay

## 2022-09-25 LAB — CULTURE, BLOOD (ROUTINE X 2)
Culture: NO GROWTH
Culture: NO GROWTH

## 2022-09-25 NOTE — Telephone Encounter (Signed)
Transition Care Management Unsuccessful Follow-up Telephone Call  Date of discharge and from where:  09/23/2022, [facility]  Attempts:  2nd Attempt  Reason for unsuccessful TCM follow-up call:  Left voice message-- [phone], call back requested.   Need to discuss scheduling a hospital follow up appointment with PCP

## 2022-09-29 ENCOUNTER — Telehealth: Payer: Self-pay

## 2022-09-29 NOTE — Telephone Encounter (Signed)
Transition Care Management Unsuccessful Follow-up Telephone Call  Date of discharge and from where:  09/23/2022, [facility]  Attempts:  3rd Attempt  Reason for unsuccessful TCM follow-up call:  Left voice message [phone] and [phone], call back requested.   Letter sent to patient requesting she contact [facility] to schedule a follow up appointment as  we have not bee able to reach her.

## 2022-09-30 ENCOUNTER — Telehealth: Payer: Self-pay

## 2022-09-30 NOTE — Progress Notes (Signed)
Patient ID: Cynthia Bray, female    DOB: 01-Oct-1961  MRN: [id number]  CC: Hospital Discharge Follow-Up  Subjective: Cynthia Bray is a 61 y.o. female who presents for hospital discharge follow-up. She is accompanied by her son Cynthia Bray.  Her concerns today include:  09/20/2022 - 09/23/2022 [facility] per MD note: Brief/Interim Summary: Patient is a 61 year old female with history of ESRD on dialysis on Monday, Wednesday, Friday, hypertension, chronic diastolic/systolic heart failure with previous EF of 25 to 30%, moderate MR, GERD, hyperthyroidism, who presented to the emergency department after syncopal episode at home.  Patient reported feeling dizzy.  Last hemodialysis was on 12/8.  On presentation, patient was tachycardic with heart rate in the range of 120s, blood pressure was in the range of 70s.  Patient was admitted under PCCM service.  Started on fluids, pressures. Currently hemodynamically stable. Patient transferred to [facility] service on 12/11.  Nephrology cleared for discharge.  She remains comfortable this afternoon.  Blood pressure is stable, ambulating in the room without any problems.  Plan for discharge today after dialysis.   Following problems were addressed during her hospitalization:   Hypotension/syncopal: Thought to be secondary to hypovolemia.  Currently normotensive.  Blood pressure improved with IV fluids.  Was briefly in peripheral pressures.  She was also briefly started on midodrine and she may not need that on discharge.  Off pressures. Orthostatic vitals negative. Cortisol level within normal limit.   Chronic combined systolic/diastolic CHF: Previous echo as per 7/22 was 25 to 30% with grade 2 diastolic dysfunction.  Recent echo shows improvement with EF of 16%, grade 1 diastolic dysfunction.  On aspirin. Taking atenolol at home which will be  discontinued on discharge.  Follows with Dr. Oval Linsey, cardiology   ESRD on dialysis: Dialyzed on Monday,  Wednesday, Friday.  Nephrology planning for dialysis today before discharge. Also has metabolic bone disease with hyperparathyroidism.  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  Recommended to follow-up CT in 3 to 6 months.  Follow-Ups [facility] on 09/22/2022; Please arrive by 12:30 for today's appointment.  Today's visit 10/07/2022: States since hospital discharge she is feeling good. Denies any recent syncope/near-syncope events. Home blood pressure similar to today's office reading. Denies red flag symptoms. Doing well on cholesterol medication. She is aware of her appointment with Cardiology on 10/21/2022. Attending dialysis as scheduled. No further issues/concerns.    Patient Active Problem List   Diagnosis Date Noted   Syncope and collapse 09/23/2022   Hypotension, unspecified 09/23/2022   Hypotension 09/20/2022   Hyperparathyroidism ([place]) 04/03/2022   Angioedema due to angiotensin converting enzyme inhibitor (ACE-I) 02/20/2022   Hyperlipidemia with target LDL less than 70 02/20/2022   Secondary hyperparathyroidism ([place]) 02/20/2022   Pain, unspecified 01/08/2022   Encounter for immunization 07/11/2021   LV dysfunction 06/19/2021   Hypokalemia 06/03/2021   Iron deficiency anemia, unspecified 06/03/2021   Pulmonary edema 05/09/2021   Hypercalcemia 05/07/2021   HTN (hypertension) 05/07/2021   Tachycardia 05/07/2021   Mild protein-calorie malnutrition ([place]) 04/30/2021   Secondary hyperparathyroidism of renal origin ([place]) 04/26/2021   Respiratory failure with hypoxia ([place]) 04/25/2021   Heart failure, unspecified (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   Macrocytic anemia 01/05/2018   Hiatal hernia 12/24/2017   Precordial pain 12/15/2017   Hypertension 12/15/2017   ESRD (end stage renal disease) on dialysis ([place]) 12/08/2017   Coronary artery disease 12/08/2017   Dialysis patient ([place]) 12/08/2017   Dyslipidemia 12/08/2017   Gastroesophageal reflux disease without esophagitis 12/02/2017   Primary cardiomyopathy ([place]) 27/78/2423   Chronic systolic dysfunction of left ventricle 07/02/2016   Heart murmur 07/02/2016     Current Outpatient Medications on File Prior to Visit  Medication Sig Dispense Refill   Methoxy PEG-Epoetin Beta (MIRCERA IJ) Mircera     acetaminophen (TYLENOL) 500 MG tablet Take 1,000 mg by mouth daily as needed for mild pain, moderate pain, fever or headache.     aspirin EC 81 MG tablet Take 81 mg by mouth in the morning.     calcitRIOL (ROCALTROL) 0.5 MCG capsule Take 8 capsules (4 mcg total) by mouth daily. (Patient taking differently: Take 4 mcg by mouth in the morning.) 30 capsule 6   Calcium Acetate 668 (169 Ca) MG TABS Take 2,004 mg by mouth with breakfast, with lunch, and with evening meal. (Patient not taking: Reported on 09/20/2022)     calcium carbonate (TUMS) 500 MG chewable tablet Chew 5 tablets (1,000 mg of elemental calcium total) by mouth 3 (three) times daily. Take 5 tabs three times daily between meals (Patient taking differently: Chew 1,000 mg by mouth 3 (three) times daily.) 450 tablet 3   meclizine (ANTIVERT) 25 MG tablet Take 25 mg by mouth daily as needed for dizziness.     multivitamin (RENA-VIT) TABS tablet Take 1 tablet by mouth at bedtime. (Patient taking differently: Take 1 tablet by mouth in the morning.) 30 tablet 0   simvastatin (ZOCOR) 20 MG tablet Take 20 mg by mouth in the morning.     VELPHORO 500 MG chewable tablet Chew 500 mg by mouth 3 (three) times daily with meals.     No current facility-administered medications on file prior to  visit.    Allergies  Allergen Reactions   Sensipar [Cinacalcet] Nausea And Vomiting   Aplisol [Tuberculin, Ppd] Swelling   Lisinopril Swelling    Angioedema    Penicillin G Swelling    Social History   Socioeconomic History   Marital status: Widowed    Spouse name: Not on file   Number of children: Not on file   Years of education: Not on file   Highest education level: Not on file  Occupational History   Not on file  Tobacco Use   Smoking status: Never    Passive exposure: Never   Smokeless tobacco: Never  Vaping Use   Vaping Use: Never used  Substance and Sexual Activity   Alcohol use: Never   Drug use: Never   Sexual activity: Not on file  Other Topics Concern   Not on file  Social History Narrative   Not on file   Social Determinants of Health   Financial Resource Strain: Not on file  Food Insecurity: No Food Insecurity (09/20/2022)   Hunger Vital Sign    Worried About Running Out of Food in the Last Year: Never true    Ran Out of Food in the Last Year: Never true  Transportation Needs: No Transportation Needs (09/20/2022)   PRAPARE - Hydrologist (Medical): No    Lack of Transportation (Non-Medical): No  Physical Activity: Not on file  Stress: Not on  file  Social Connections: Not on file  Intimate Partner Violence: Not At Risk (09/20/2022)   Humiliation, Afraid, Rape, and Kick questionnaire    Fear of Current or Ex-Partner: No    Emotionally Abused: No    Physically Abused: No    Sexually Abused: No    Family History  Problem Relation Age of Onset   Kidney disease Mother    Heart failure Sister    Heart attack Sister     Past Surgical History:  Procedure Laterality Date   AV FISTULA PLACEMENT     CARDIAC CATHETERIZATION     IR FLUORO GUIDE CV LINE RIGHT  04/10/2022   IR REMOVAL TUN CV CATH W/O FL  04/29/2022   IR US GUIDE VASC ACCESS RIGHT  04/10/2022   PARATHYROIDECTOMY N/A 04/03/2022   Procedure: NECK EXPLORATION,  LEFT SUPERIOR PARATHYROIDECTOMY, LEFT INFERIOR PARATHYROIDECTOMY, RIGHT SUPERIOR PARATHYROIDECTOMY;  Surgeon: Armandina Gemma, MD;  Location: [facility];  Service: General;  Laterality: N/A;   VASCULAR SURGERY     fistula bilaterally. left arm removed    ROS: Review of Systems Negative except as stated above  PHYSICAL EXAM: BP 117/74 (BP Location: Left Arm, Patient Position: Sitting, Cuff Size: Normal)   Pulse 84   Temp 98.3 F (36.8 C)   Resp 16   Ht 5' 6.93" (1.7 m)   Wt 134 lb (60.8 kg)   SpO2 98%   BMI 21.03 kg/m    Physical Exam HENT:     Head: Normocephalic and atraumatic.  Eyes:     Extraocular Movements: Extraocular movements intact.     Conjunctiva/sclera: Conjunctivae normal.     Pupils: Pupils are equal, round, and reactive to light.  Cardiovascular:     Rate and Rhythm: Normal rate and regular rhythm.     Pulses: Normal pulses.     Heart sounds: Normal heart sounds.  Pulmonary:     Effort: Pulmonary effort is normal.     Breath sounds: Normal breath sounds.  Musculoskeletal:     Cervical back: Normal range of motion and neck supple.  Neurological:     General: No focal deficit present.     Mental Status: She is alert and oriented to person, place, and time.  Psychiatric:        Mood and Affect: Mood normal.        Behavior: Behavior normal.      ASSESSMENT AND PLAN: 1. Hospital discharge follow-up - Reviewed hospital course, current medications, ensured proper follow-up in place, and addressed concerns.   2. Hypotension, unspecified hypotension type 3. Syncope, unspecified syncope type 4. Chronic combined systolic and diastolic congestive heart failure ([place]) 5. Hyperlipidemia, unspecified hyperlipidemia type - Continue present management.  - Keep all scheduled appointments with Cardiology.   6. ESRD (end stage renal disease) on dialysis [facility]) - Keep all scheduled appointments with dialysis.   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 - Ambulatory referral to Pulmonology  8. Hyperparathyroidism [facility]) - Referral to Endocrinology for further evaluation/management.  - Ambulatory referral to Endocrinology    Patient was given the opportunity to ask questions.  Patient verbalized understanding of the plan and was able to repeat key elements of the plan. Patient was given clear instructions to go to Emergency Department or return to medical center if symptoms don't improve, worsen, or new problems develop.The patient verbalized understanding.   Orders Placed This Encounter  Procedures   Ambulatory referral to Endocrinology   Ambulatory  referral to Pulmonology    Follow-up with primary provider as scheduled.   Camillia Herter, NP

## 2022-09-30 NOTE — Telephone Encounter (Signed)
Copied from [place] [phone]. Topic: Appointment Scheduling - Scheduling Inquiry for Clinic >> Sep 30, 2022 10:36 AM Ludger Nutting wrote: Patient's son called to schedule a hospital follow up with pcp. Patient was discharged from [facility] on 09/23/22. No appointments available until February. Can patient be working into schedule? Please advise.  Spoke to pt son [name] mother scheduled for 12/26 w/A.Minette Brine NP

## 2022-10-07 ENCOUNTER — Ambulatory Visit (INDEPENDENT_AMBULATORY_CARE_PROVIDER_SITE_OTHER): Payer: Commercial Managed Care - HMO | Admitting: Family

## 2022-10-07 ENCOUNTER — Encounter: Payer: Self-pay | Admitting: Family

## 2022-10-07 VITALS — BP 117/74 | HR 84 | Temp 98.3°F | Resp 16 | Ht 66.93 in | Wt 134.0 lb

## 2022-10-07 DIAGNOSIS — Z992 Dependence on renal dialysis: Secondary | ICD-10-CM

## 2022-10-07 DIAGNOSIS — I959 Hypotension, unspecified: Secondary | ICD-10-CM | POA: Diagnosis not present

## 2022-10-07 DIAGNOSIS — R55 Syncope and collapse: Secondary | ICD-10-CM

## 2022-10-07 DIAGNOSIS — E785 Hyperlipidemia, unspecified: Secondary | ICD-10-CM | POA: Diagnosis not present

## 2022-10-07 DIAGNOSIS — E213 Hyperparathyroidism, unspecified: Secondary | ICD-10-CM

## 2022-10-07 DIAGNOSIS — I5042 Chronic combined systolic (congestive) and diastolic (congestive) heart failure: Secondary | ICD-10-CM | POA: Diagnosis not present

## 2022-10-07 DIAGNOSIS — N186 End stage renal disease: Secondary | ICD-10-CM

## 2022-10-07 DIAGNOSIS — E320 Persistent hyperplasia of thymus: Secondary | ICD-10-CM

## 2022-10-07 DIAGNOSIS — Z09 Encounter for follow-up examination after completed treatment for conditions other than malignant neoplasm: Secondary | ICD-10-CM

## 2022-10-07 NOTE — Patient Instructions (Signed)
Referral to Pulmonology for thymic hyperplasia.  Referral to Endocrinology for hyperparathyroidism. Syncope, Adult  Syncope refers to a condition in which a person temporarily loses consciousness. Syncope may also be called fainting or passing out. It is caused by a sudden decrease in blood flow to the brain. This can happen for a variety of reasons. Most causes of syncope are not dangerous. It can be triggered by things such as needle sticks, seeing blood, pain, or intense emotion. However, syncope can also be a sign of a serious medical problem, such as a heart abnormality. Other causes can include dehydration, migraines, or taking medicines that lower blood pressure. Your health care provider may do tests to find the reason why you are having syncope. If you faint, get medical help right away. Call your local emergency services (911 in the [place] Follow these instructions at home: Pay attention to any changes in your symptoms. Take these actions to stay safe and to help relieve your symptoms: Knowing when you may be about to faint Signs that you may be about to faint include: Feeling dizzy, weak, light-headed, or like the room is spinning. Feeling nauseous. Seeing spots or seeing all white or all black in your field of vision. Having cold, clammy skin or feeling warm and sweaty. Hearing ringing in the ears (tinnitus). If you start to feel like you might faint, sit or lie down right away. If sitting, put your head down between your legs. If lying down, raise (elevate) your feet above the level of your heart. Breathe deeply and steadily. Wait until all the symptoms have passed. Have someone stay with you until you feel stable. Medicines Take over-the-counter and prescription medicines only as told by your health care provider. If you are taking blood pressure or heart medicine, get up slowly and take several minutes to sit and then stand. This can reduce dizziness and decrease the risk of  syncope. Lifestyle Do not drive, use machinery, or play sports until your health care provider says it is okay. Do not drink alcohol. Do not use any products that contain nicotine or tobacco. These products include cigarettes, chewing tobacco, and vaping devices, such as e-cigarettes. If you need help quitting, ask your health care provider. Avoid hot tubs and saunas. General instructions Talk with your health care provider about your symptoms. You may need to have testing to understand the cause of your syncope. Drink enough fluid to keep your urine pale yellow. Avoid prolonged standing. If you must stand for a long time, do movements such as: Moving your legs. Crossing your legs. Flexing and stretching your leg muscles. Squatting. Keep all follow-up visits. This is important. Contact a health care provider if: You have episodes of near fainting. Get help right away if: You faint. You hit your head or are injured after fainting. You have any of these symptoms that may indicate trouble with your heart: Fast or irregular heartbeats (palpitations). Unusual pain in your chest, abdomen, or back. Shortness of breath. You have a seizure. You have a severe headache. You are confused. You have vision problems. You have severe weakness or trouble walking. You are bleeding from your mouth or rectum, or you have black or tarry stool. These symptoms may represent a serious problem that is an emergency. Do not wait to see if your symptoms will go away. Get medical help right away. Call your local emergency services (911 in the [place] Do not drive yourself to the hospital. Summary Syncope refers to a condition  in which a person temporarily loses consciousness. Syncope may also be called fainting or passing out. It is caused by a sudden decrease in blood flow to the brain. Signs that you may be about to faint include dizziness, feeling light-headed, feeling nauseous, sudden vision changes, or cold,  clammy skin. Even though most causes of syncope are not dangerous, syncope can be a sign of a serious medical problem. Get help right away if you faint. If you start to feel like you might faint, sit or lie down right away. If sitting, put your head down between your legs. If lying down, raise (elevate) your feet above the level of your heart. This information is not intended to replace advice given to you by your health care provider. Make sure you discuss any questions you have with your health care provider. Document Revised: 02/07/2021 Document Reviewed: 02/07/2021 Elsevier Patient Education  [place].

## 2022-10-07 NOTE — Progress Notes (Signed)
.  Pt presents for hospital follow-up  

## 2022-10-15 ENCOUNTER — Telehealth: Payer: Self-pay | Admitting: Family

## 2022-10-15 NOTE — Telephone Encounter (Signed)
Left message for patient to call back and schedule Medicare Annual Wellness Visit (AWV) either virtually or phone  Left  my Cynthia Bray number [phone]   awvi 01/12/12 per [facility]  please schedule with Nurse Health Adviser   45 min for awv-i and in office appointments 30 min for awv-s  phone/virtual appointments

## 2022-10-20 NOTE — Progress Notes (Signed)
Office Visit    Patient Name: Cynthia Bray Date of Encounter: 10/20/2022  Primary Care Provider:  Rema Fendt, NP Primary Cardiologist:  None Primary Electrophysiologist: None  Chief Complaint    Jacquella Qiao is a 62 y.o. female with PMH of chronic combined diastolic systolic CHF, GERD, hypothyroidism, moderate MR, and ESRD (Monday, Wednesday, Friday), HLD, who presents today for hospital follow-up of syncope.  Past Medical History    Past Medical History:  Diagnosis Date   CHF (congestive heart failure) (HCC)    EF 25%   ESRD (end stage renal disease) (HCC)    GERD (gastroesophageal reflux disease)    Hypertension    Hyperthyroidism    Renal disorder    Past Surgical History:  Procedure Laterality Date   AV FISTULA PLACEMENT     CARDIAC CATHETERIZATION     IR FLUORO GUIDE CV LINE RIGHT  04/10/2022   IR REMOVAL TUN CV CATH W/O FL  04/29/2022   IR US GUIDE VASC ACCESS RIGHT  04/10/2022   PARATHYROIDECTOMY N/A 04/03/2022   Procedure: NECK EXPLORATION, LEFT SUPERIOR PARATHYROIDECTOMY, LEFT INFERIOR PARATHYROIDECTOMY, RIGHT SUPERIOR PARATHYROIDECTOMY;  Surgeon: Darnell Level, MD;  Location: [facility] OR;  Service: General;  Laterality: N/A;   VASCULAR SURGERY     fistula bilaterally. left arm removed    Allergies  Allergies  Allergen Reactions   Sensipar [Cinacalcet] Nausea And Vomiting   Aplisol [Tuberculin, Ppd] Swelling   Lisinopril Swelling    Angioedema    Penicillin G Swelling    History of Present Illness    Cynthia Bray  is a 62year old female with the above mention past medical history who presents today for hospital follow-up of syncope.  [name] was initially seen in 04/2021 during hospitalization for CHF exacerbation.  She was recently followed at [facility].  She had a previous admission 12/2020 for chest pain with elevated troponins.  She underwent pharmacological stress test that showed small mild reversible defect in anterior  septal wall consistent with ischemia.  She underwent LHC which showed no significant CAD with elevated LVEDP.  She underwent inpatient HD with improvement in symptoms.  She was admitted 04/11/2021 with complaint of chest pain and found to have staph epi bacteremia with 2D echo completed showing newly depressed EF of 20-25%.  She was started on GDMT and treated with IV antibiotics for 2 weeks.  She also received 1 unit of PRBC with improvement in hemoglobin.  She was seen at [facility] long ED on 7/26 with complaint of progressive shortness of breath with chest pain that developed during HD session. BNP greater than 4500, high-sensitivity troponin 91>>98>>127 and CT angio was negative for PE but showed small bilateral pleural effusions along with bilateral pulmonary consolidations consistent with multilobar pneumonia.  She was admitted to hospital service and started on IV antibiotics.  Has a history of angioedema and GDMT is limited to metoprolol and isosorbide, hydralazine.  She was seen 02/2022 for preop examination for parathyroidectomy which she underwent successfully on 04/2022.  She was seen by Dr. Duke Salvia on 07/01/2022 and was euvolemic on exam with stable blood pressure.  She was seen in the ED on 09/20/2022 with complaint of syncope.  She was lying down following dialysis session and when she sat up she passed out and hit her head.  She had difficulty walking following this episode and had complaint of dizziness with ambulation.  On presentation to ED she was tachycardic in the 120s and BP was  in the 70s systolic.  She was started on IV fluids and Levophed.  She underwent HD session on 12/11 and blood pressure was stable.  Syncopal episode felt to be secondary to hypovolemia and patient had negative orthostatic vital signs.  She was briefly started on midodrine which was discontinued prior to discharge.  Echo was completed and EF was 60% with grade 1 DD.  Patient was discharged   Ms. Cynthia Bray presents today with  her son for posthospital follow-up.  Since last being seen in the office patient reports that she has been doing well with no recurrent episodes of presyncope or syncope since discharge.  She has done well following recent HD sessions without hypotension.  Her blood pressure today is well-controlled at 134/62 and was DM.  She reports that her blood pressures at home are in the 120s 130s systolically.  She is compliant with current medications and denies any adverse reactions. Patient denies chest pain, palpitations, dyspnea, PND, orthopnea, nausea, vomiting, dizziness, syncope, edema, weight gain, or early satiety.   Home Medications    Current Outpatient Medications  Medication Sig Dispense Refill   acetaminophen (TYLENOL) 500 MG tablet Take 1,000 mg by mouth daily as needed for mild pain, moderate pain, fever or headache.     aspirin EC 81 MG tablet Take 81 mg by mouth in the morning.     calcitRIOL (ROCALTROL) 0.5 MCG capsule Take 8 capsules (4 mcg total) by mouth daily. (Patient taking differently: Take 4 mcg by mouth in the morning.) 30 capsule 6   Calcium Acetate 668 (169 Ca) MG TABS Take 2,004 mg by mouth with breakfast, with lunch, and with evening meal. (Patient not taking: Reported on 09/20/2022)     calcium carbonate (TUMS) 500 MG chewable tablet Chew 5 tablets (1,000 mg of elemental calcium total) by mouth 3 (three) times daily. Take 5 tabs three times daily between meals (Patient taking differently: Chew 1,000 mg by mouth 3 (three) times daily.) 450 tablet 3   meclizine (ANTIVERT) 25 MG tablet Take 25 mg by mouth daily as needed for dizziness.     Methoxy PEG-Epoetin Beta (MIRCERA IJ) Mircera     multivitamin (RENA-VIT) TABS tablet Take 1 tablet by mouth at bedtime. (Patient taking differently: Take 1 tablet by mouth in the morning.) 30 tablet 0   simvastatin (ZOCOR) 20 MG tablet Take 20 mg by mouth in the morning.     VELPHORO 500 MG chewable tablet Chew 500 mg by mouth 3 (three) times  daily with meals.     No current facility-administered medications for this visit.     Review of Systems  Please see the history of present illness.     All other systems reviewed and are otherwise negative except as noted above.  Physical Exam    Wt Readings from Last 3 Encounters:  10/07/22 134 lb (60.8 kg)  09/23/22 134 lb 4.2 oz (60.9 kg)  07/31/22 118 lb (53.5 kg)   ZO:XWRUE were no vitals filed for this visit.,There is no height or weight on file to calculate BMI.  Constitutional:      Appearance: Healthy appearance. Not in distress.  Neck:     Vascular: JVD normal.  Pulmonary:     Effort: Pulmonary effort is normal.     Breath sounds: No wheezing. No rales. Diminished in the bases Cardiovascular:     Normal rate. Regular rhythm. Normal S1. Normal S2.      Murmurs: There is no murmur.  Edema:  Peripheral edema absent.  Abdominal:     Palpations: Abdomen is soft non tender. There is no hepatomegaly.  Skin:    General: Skin is warm and dry.  Neurological:     General: No focal deficit present.     Mental Status: Alert and oriented to person, place and time.     Cranial Nerves: Cranial nerves are intact.  EKG/LABS/Other Studies Reviewed    ECG personally reviewed by me today -none completed today   Lab Results  Component Value Date   WBC 6.0 09/21/2022   HGB 10.6 (L) 09/21/2022   HCT 33.2 (L) 09/21/2022   MCV 92.2 09/21/2022   PLT 185 09/21/2022   Lab Results  Component Value Date   CREATININE 10.22 (H) 09/22/2022   BUN 56 (H) 09/22/2022   NA 138 09/22/2022   K 4.7 09/22/2022   CL 105 09/22/2022   CO2 19 (L) 09/22/2022   Lab Results  Component Value Date   ALT 9 09/21/2022   AST 13 (L) 09/21/2022   ALKPHOS 221 (H) 09/21/2022   BILITOT 0.7 09/21/2022   No results found for: "CHOL", "HDL", "LDLCALC", "LDLDIRECT", "TRIG", "CHOLHDL"  No results found for: "HGBA1C"  Assessment & Plan    1.  Syncope and collapse: -Patient had recent ED and  hospitalization due to episode of syncope and collapse that occurred following HD session.  She was admitted and given pressors and IV fluids with improvement to BP.  Orthostatic vitals were negative. -Today patient reports no recurrence of syncope or presyncope since hospitalization. -We will order 7-day ZIO monitor to evaluate for possible arrhythmia or nocturnal pauses.    2.  Chronic combined CHF: -2D echo completed 09/20/2022 with RWMA and EF of 60 to 65% degenerative MV and trivial MV regurgitation -Volume is managed by HD and patient is euvolemic today on examination.  3.  Coronary artery disease: -s/p LHC 2022 with no significant CAD noted. -Today patient reports no chest discomfort or shortness of breath. -Continue ASA 81 mg and simvastatin 20 mg  4.  ESRD: -Patient currently Monday, Wednesday, Friday -Reports no presyncope or hypotension with recent HD sessions  5.  Hyperlipidemia: -Patient's last LDL cholesterol was 100 above goal less than 70 -She will need repeat lipids and LFTs -Continue simvastatin 20 mg daily  Disposition: Follow-up with None or APP in 6 months   Medication Adjustments/Labs and Tests Ordered: Current medicines are reviewed at length with the patient today.  Concerns regarding medicines are outlined above.   Signed, Napoleon Form, Leodis Rains, NP 10/20/2022, 7:07 AM [facility]  Note:  This document was prepared using Dragon voice recognition software and may include unintentional dictation errors.

## 2022-10-21 ENCOUNTER — Ambulatory Visit: Payer: 59 | Attending: Nurse Practitioner

## 2022-10-21 ENCOUNTER — Encounter: Payer: Self-pay | Admitting: Nurse Practitioner

## 2022-10-21 ENCOUNTER — Ambulatory Visit: Payer: 59 | Attending: Cardiovascular Disease | Admitting: Nurse Practitioner

## 2022-10-21 VITALS — BP 134/62 | HR 95 | Ht 67.0 in | Wt 133.8 lb

## 2022-10-21 DIAGNOSIS — R55 Syncope and collapse: Secondary | ICD-10-CM | POA: Diagnosis not present

## 2022-10-21 DIAGNOSIS — N186 End stage renal disease: Secondary | ICD-10-CM | POA: Diagnosis not present

## 2022-10-21 DIAGNOSIS — I5022 Chronic systolic (congestive) heart failure: Secondary | ICD-10-CM | POA: Diagnosis not present

## 2022-10-21 DIAGNOSIS — E785 Hyperlipidemia, unspecified: Secondary | ICD-10-CM | POA: Diagnosis not present

## 2022-10-21 DIAGNOSIS — I2510 Atherosclerotic heart disease of native coronary artery without angina pectoris: Secondary | ICD-10-CM

## 2022-10-21 NOTE — Patient Instructions (Addendum)
Medication Instructions:  Your physician recommends that you continue on your current medications as directed. Please refer to the Current Medication list given to you today. *If you need a refill on your cardiac medications before your next appointment, please call your pharmacy*  Lab Work: None Ordered  Testing/Procedures: [place]n [place]- Long Term Monitor Instructions  Your physician has requested you wear a ZIO patch monitor for 14 days.  This is a single patch monitor. [employer] supplies one patch monitor per enrollment. Additional stickers are not available. Please do not apply patch if you will be having a Nuclear Stress Test,  Echocardiogram, Cardiac CT, MRI, or Chest Xray during the period you would be wearing the  monitor. The patch cannot be worn during these tests. You cannot remove and re-apply the  ZIO XT patch monitor.  Your ZIO patch monitor will be mailed 3 day USPS to your address on file. It may take 3-5 days  to receive your monitor after you have been enrolled.  Once you have received your monitor, please review the enclosed instructions. Your monitor  has already been registered assigning a specific monitor serial # to you.  Billing and Patient Assistance Program Information  We have supplied [employer] with any of your insurance information on file for billing purposes. [employer] offers a sliding scale Patient Assistance Program for patients that do not have  insurance, or whose insurance does not completely cover the cost of the ZIO monitor.  You must apply for the Patient Assistance Program to qualify for this discounted rate.  To apply, please call [employer] at [phone], select option 4, select option 2, ask to apply for  Patient Assistance Program. Theodore Demark will ask your household income, and how many people  are in your household. They will quote your out-of-pocket cost based on that information.  [employer] will also be able to set up a 42-month interest-free payment plan  if needed.  Applying the monitor   Shave hair from upper left chest.  Hold abrader disc by orange tab. Rub abrader in 40 strokes over the upper left chest as  indicated in your monitor instructions.  Clean area with 4 enclosed alcohol pads. Let dry.  Apply patch as indicated in monitor instructions. Patch will be placed under collarbone on left  side of chest with arrow pointing upward.  Rub patch adhesive wings for 2 minutes. Remove white label marked "1". Remove the white  label marked "2". Rub patch adhesive wings for 2 additional minutes.  While looking in a mirror, press and release button in center of patch. A small green light will  flash 3-4 times. This will be your only indicator that the monitor has been turned on.  Do not shower for the first 24 hours. You may shower after the first 24 hours.  Press the button if you feel a symptom. You will hear a small click. Record Date, Time and  Symptom in the Patient Logbook.  When you are ready to remove the patch, follow instructions on the last 2 pages of Patient  Logbook. Stick patch monitor onto the last page of Patient Logbook.  Place Patient Logbook in the blue and white box. Use locking tab on box and tape box closed  securely. The blue and white box has prepaid postage on it. Please place it in the mailbox as  soon as possible. Your physician should have your test results approximately 7 days after the  monitor has been [facility]  Call [employer] Customer Care at [phone] if you have questions regarding  your ZIO XT patch monitor. Call them immediately if you see an orange light blinking on your  monitor.  If your monitor falls off in less than 4 days, contact our Monitor department at [phone].  If your monitor becomes loose or falls off after 4 days call [employer] at [phone] for  suggestions on securing your monitor   Follow-Up: At [facility], you and your health needs are  our priority.  As part of our continuing mission to provide you with exceptional heart care, we have created designated Provider Care Teams.  These Care Teams include your primary Cardiologist (physician) and Advanced Practice Providers (APPs -  Physician Assistants and Nurse Practitioners) who all work together to provide you with the care you need, when you need it.  We recommend signing up for the patient portal called "MyChart".  Sign up information is provided on this After Visit Summary.  MyChart is used to connect with patients for Virtual Visits (Telemedicine).  Patients are able to view lab/test results, encounter notes, upcoming appointments, etc.  Non-urgent messages can be sent to your provider as well.   To learn more about what you can do with MyChart, go to [email or web address].    Your next appointment:   6 month(s)  The format for your next appointment:   In Person  Provider:   Skeet Latch, MD    Other Instructions   Important Information About Sugar

## 2022-10-21 NOTE — Progress Notes (Unsigned)
Enrolled patient for a 7 day Zio XT monitor to be mailed to patients home.   Dr [place] to read 

## 2022-10-25 DIAGNOSIS — R55 Syncope and collapse: Secondary | ICD-10-CM

## 2022-11-19 ENCOUNTER — Encounter (HOSPITAL_COMMUNITY): Payer: Self-pay | Admitting: *Deleted

## 2022-11-19 ENCOUNTER — Emergency Department (HOSPITAL_COMMUNITY)
Admission: EM | Admit: 2022-11-19 | Discharge: 2022-11-20 | Disposition: A | Discharge status: Home / Self Care | Payer: 59 | Attending: Emergency Medicine | Admitting: Emergency Medicine

## 2022-11-19 ENCOUNTER — Other Ambulatory Visit: Payer: Self-pay

## 2022-11-19 DIAGNOSIS — N186 End stage renal disease: Secondary | ICD-10-CM | POA: Insufficient documentation

## 2022-11-19 DIAGNOSIS — I132 Hypertensive heart and chronic kidney disease with heart failure and with stage 5 chronic kidney disease, or end stage renal disease: Secondary | ICD-10-CM | POA: Insufficient documentation

## 2022-11-19 DIAGNOSIS — E039 Hypothyroidism, unspecified: Secondary | ICD-10-CM | POA: Insufficient documentation

## 2022-11-19 DIAGNOSIS — Z992 Dependence on renal dialysis: Secondary | ICD-10-CM | POA: Insufficient documentation

## 2022-11-19 DIAGNOSIS — I509 Heart failure, unspecified: Secondary | ICD-10-CM | POA: Diagnosis not present

## 2022-11-19 DIAGNOSIS — Z7982 Long term (current) use of aspirin: Secondary | ICD-10-CM | POA: Insufficient documentation

## 2022-11-19 DIAGNOSIS — R42 Dizziness and giddiness: Secondary | ICD-10-CM | POA: Insufficient documentation

## 2022-11-19 LAB — CBC WITH DIFFERENTIAL/PLATELET
Abs Immature Granulocytes: 0.02 10*3/uL (ref 0.00–0.07)
Basophils Absolute: 0.1 10*3/uL (ref 0.0–0.1)
Basophils Relative: 1 %
Eosinophils Absolute: 0.1 10*3/uL (ref 0.0–0.5)
Eosinophils Relative: 1 %
HCT: 38.9 % (ref 36.0–46.0)
Hemoglobin: 12 g/dL (ref 12.0–15.0)
Immature Granulocytes: 0 %
Lymphocytes Relative: 21 %
Lymphs Abs: 1.7 10*3/uL (ref 0.7–4.0)
MCH: 29.5 pg (ref 26.0–34.0)
MCHC: 30.8 g/dL (ref 30.0–36.0)
MCV: 95.6 fL (ref 80.0–100.0)
Monocytes Absolute: 0.5 10*3/uL (ref 0.1–1.0)
Monocytes Relative: 7 %
Neutro Abs: 5.5 10*3/uL (ref 1.7–7.7)
Neutrophils Relative %: 70 %
Platelets: 225 10*3/uL (ref 150–400)
RBC: 4.07 MIL/uL (ref 3.87–5.11)
RDW: 15.1 % (ref 11.5–15.5)
WBC: 7.8 10*3/uL (ref 4.0–10.5)
nRBC: 0 % (ref 0.0–0.2)

## 2022-11-19 LAB — CBG MONITORING, ED: Glucose-Capillary: 111 mg/dL — ABNORMAL HIGH (ref 70–99)

## 2022-11-19 NOTE — ED Triage Notes (Signed)
The pt is a dialysis pt and while she was on dialysis she started getting dizzy  and she has had this since  hx of hypotension    rt arm fistula    vomiting also

## 2022-11-19 NOTE — ED Provider Triage Note (Signed)
  Emergency Medicine Provider Triage Evaluation Note  MRN:  [id number]  Arrival date & time: 11/19/22    Medically screening exam initiated at 11:31 PM.   CC:   Dizziness   HPI:  Cynthia Bray is a 62 y.o. year-old female presents to the ED with chief complaint of lightheadedness after dialysis today.  States she thinks they took off too much fluid.  Better with rest.  Unaffected with head movement.  History provided by patient. ROS:  -As included in HPI PE:   Vitals:   11/19/22 2311  BP: (!) 96/52  Pulse: (!) 104  Resp: 18  Temp: 98.6 F (37 C)  SpO2: 100%    Non-toxic appearing No respiratory distress  MDM:   I've ordered labs and EKG in triage to expedite lab/diagnostic workup.  Patient was informed that the remainder of the evaluation will be completed by another provider, this initial triage assessment does not replace that evaluation, and the importance of remaining in the ED until their evaluation is complete.    Montine Circle, PA-C 11/19/22 2331

## 2022-11-19 NOTE — ED Notes (Signed)
Rt upper arm dialysis fistula  she may void twice a day or less

## 2022-11-20 DIAGNOSIS — R42 Dizziness and giddiness: Secondary | ICD-10-CM | POA: Diagnosis not present

## 2022-11-20 LAB — BASIC METABOLIC PANEL
Anion gap: 15 (ref 5–15)
Anion gap: 16 — ABNORMAL HIGH (ref 5–15)
BUN: 25 mg/dL — ABNORMAL HIGH (ref 8–23)
BUN: 28 mg/dL — ABNORMAL HIGH (ref 8–23)
CO2: 27 mmol/L (ref 22–32)
CO2: 29 mmol/L (ref 22–32)
Calcium: 11.4 mg/dL — ABNORMAL HIGH (ref 8.9–10.3)
Calcium: 12.1 mg/dL — ABNORMAL HIGH (ref 8.9–10.3)
Chloride: 93 mmol/L — ABNORMAL LOW (ref 98–111)
Chloride: 94 mmol/L — ABNORMAL LOW (ref 98–111)
Creatinine, Ser: 5.52 mg/dL — ABNORMAL HIGH (ref 0.44–1.00)
Creatinine, Ser: 5.57 mg/dL — ABNORMAL HIGH (ref 0.44–1.00)
GFR, Estimated: 8 mL/min — ABNORMAL LOW (ref >60)
GFR, Estimated: 8 mL/min — ABNORMAL LOW (ref >60)
Glucose, Bld: 105 mg/dL — ABNORMAL HIGH (ref 70–99)
Glucose, Bld: 117 mg/dL — ABNORMAL HIGH (ref 70–99)
Potassium: 4.3 mmol/L (ref 3.5–5.1)
Potassium: 4.9 mmol/L (ref 3.5–5.1)
Sodium: 136 mmol/L (ref 135–145)
Sodium: 138 mmol/L (ref 135–145)

## 2022-11-20 NOTE — ED Provider Notes (Signed)
[place] Provider Note   CSN: [id number] Arrival date & time: 11/19/22  2255     History  Chief Complaint  Patient presents with   Dizziness    Cynthia Bray is a 62 y.o. female.  The history is provided by the patient.  Dizziness She has history of hypertension, hypothyroidism, heart failure, end-stage renal disease on hemodialysis and noted an episode of dizziness after she got home from dialysis today.  She describes a spinning sensation which lasted about 20 minutes before resolving.  She did not have any nausea or vomiting while she had a spinning sensation, but she had some nausea and vomiting while at dialysis.  She states her blood pressure was on the low side when she left, but she was able to ambulate to the [place].  When at home, dizziness was not any worse when she stood up.  She denies chest pain, heaviness, tightness, pressure.   Home Medications Prior to Admission medications   Medication Sig Start Date End Date Taking? Authorizing Provider  acetaminophen (TYLENOL) 500 MG tablet Take 1,000 mg by mouth daily as needed for mild pain, moderate pain, fever or headache.    [provider]  aspirin EC 81 MG tablet Take 81 mg by mouth in the morning.    [provider]  calcitRIOL (ROCALTROL) 0.5 MCG capsule Take 8 capsules (4 mcg total) by mouth daily. 04/16/22   Loren Racer, PA-C  Calcium Acetate 668 (169 Ca) MG TABS Take 2,004 mg by mouth with breakfast, with lunch, and with evening meal.    [provider]  calcium carbonate (TUMS) 500 MG chewable tablet Chew 5 tablets (1,000 mg of elemental calcium total) by mouth 3 (three) times daily. Take 5 tabs three times daily between meals Patient taking differently: Chew 1,000 mg by mouth 3 (three) times daily. 04/15/22 04/15/23  Loren Racer, PA-C  meclizine (ANTIVERT) 25 MG tablet Take 25 mg by mouth daily as needed for dizziness. 07/30/22   [provider]  Methoxy PEG-Epoetin Beta (MIRCERA IJ) Mircera 09/29/22 09/28/23  [provider]  multivitamin (RENA-VIT) TABS tablet Take 1 tablet by mouth at bedtime. Patient taking differently: Take 1 tablet by mouth in the morning. 05/10/21   Aline August, MD  simvastatin (ZOCOR) 20 MG tablet Take 20 mg by mouth in the morning.    [provider]  VELPHORO 500 MG chewable tablet Chew 500 mg by mouth 3 (three) times daily with meals. 07/14/22   [provider]      Allergies    Sensipar [cinacalcet]; Aplisol [tuberculin, ppd]; Lisinopril; and Penicillin g    Review of Systems   Review of Systems  Neurological:  Positive for dizziness.  All other systems reviewed and are negative.   Physical Exam Updated Vital Signs BP 92/64   Pulse 95   Temp 98.6 F (37 C)   Resp 16   Ht '5\' 7"'$  (1.702 m)   Wt 60.7 kg   SpO2 100%   BMI 20.96 kg/m  Physical Exam Vitals and nursing note reviewed.   62 year old female, resting comfortably and in no acute distress. Vital signs are normal1. Oxygen saturation is100%, which is normal. Head is normocephalic and atraumatic. PERRLA, EOMI. Oropharynx is clear. Neck is nontender and supple without adenopathy or JVD. Back is nontender and there is no CVA tenderness. Lungs are clear without rales, wheezes, or rhonchi. Chest is nontender. Heart has regular rate  and rhythm without murmur. Abdomen is soft, flat, nontender. Extremities have no cyanosis or edema, full range of motion is present.  AV fistula is present in the right upper arm with thrill present. Skin is warm and dry without rash. Neurologic: Mental status is normal, cranial nerves are intact, moves all extremities equally.  There is no nystagmus.  Dizziness is not reproduced by passive head movement.  ED Results / Procedures / Treatments   Labs (all labs ordered are listed, but only abnormal results are displayed) Labs Reviewed  BASIC METABOLIC PANEL -  Abnormal; Notable for the following components:      Result Value   Chloride 93 (*)    Glucose, Bld 117 (*)    BUN 25 (*)    Creatinine, Ser 5.52 (*)    Calcium 12.1 (*)    GFR, Estimated 8 (*)    Anion gap 16 (*)    All other components within normal limits  CBG MONITORING, ED - Abnormal; Notable for the following components:   Glucose-Capillary 111 (*)    All other components within normal limits  CBC WITH DIFFERENTIAL/PLATELET    EKG EKG Interpretation  Date/Time:  Thursday November 20 2022 00:41:29 EST Ventricular Rate:  90 PR Interval:  141 QRS Duration: 83 QT Interval:  349 QTC Calculation: 427 R Axis:   93 Text Interpretation: Sinus rhythm Right axis deviation Borderline T wave abnormalities When compared with ECG of 12/9/20223, No significant change was found Confirmed by Delora Fuel ([id number]) on 11/20/2022 1:01:22 AM  Radiology No results found.  Procedures Procedures  Cardiac monitor shows normal sinus rhythm, per my interpretation.  Medications Ordered in ED Medications - No data to display  ED Course/ Medical Decision Making/ A&P                             Medical Decision Making Amount and/or Complexity of Data Reviewed Labs: ordered.   Transient episode of dizziness which sounds like it was likely peripheral vertigo, consider hypovolemia from the excess fluid draw at dialysis.  I have reviewed and interpreted her electrocardiogram, my interpretation is nonspecific T wave changes not significantly changed from prior.  I have reviewed and interpreted her laboratory test, my interpretation is elevated BUN and creatinine consistent with end-stage renal disease, elevated calcium which is new.  I have reviewed her past records, and she last had a significant elevated calcium in July 2022.  I am concerned this may be a lab error and I have ordered a repeat basic metabolic panel.  I have also ordered orthostatic vital signs to look for evidence of  hypovolemia.  Orthostatic vital signs did show a borderline significant rise in heart rate with no change in blood pressure.  She was not symptomatic with this.  Repeat metabolic panel shows calcium level has dropped to 11.4.  I suspect that this is some recalibration following dialysis.  This can be followed safely as an outpatient.  I am discharging her with instructions to have heard dialysis team recheck her calcium level in 1 week.  Final Clinical Impression(s) / ED Diagnoses Final diagnoses:  Dizziness  Hypercalcemia  End-stage renal disease on hemodialysis [facility])    Rx / DC Orders ED Discharge Orders     None         Delora Fuel, MD 09/32/67 [phone]

## 2022-11-20 NOTE — Discharge Instructions (Signed)
Have your kidney doctor recheck your calcium level in about 1 week.

## 2022-12-30 ENCOUNTER — Ambulatory Visit (HOSPITAL_BASED_OUTPATIENT_CLINIC_OR_DEPARTMENT_OTHER): Payer: Medicare Other | Admitting: Cardiovascular Disease

## 2023-01-06 ENCOUNTER — Ambulatory Visit (INDEPENDENT_AMBULATORY_CARE_PROVIDER_SITE_OTHER): Payer: Medicare PPO | Admitting: Family

## 2023-01-06 ENCOUNTER — Other Ambulatory Visit (HOSPITAL_COMMUNITY)
Admission: RE | Admit: 2023-01-06 | Discharge: 2023-01-06 | Disposition: A | Discharge status: Home / Self Care | Payer: Medicare PPO | Source: Ambulatory Visit | Attending: Family | Admitting: Family

## 2023-01-06 ENCOUNTER — Encounter: Payer: Self-pay | Admitting: Family

## 2023-01-06 VITALS — BP 117/72 | HR 85 | Temp 98.3°F | Resp 16 | Ht 67.01 in | Wt 134.0 lb

## 2023-01-06 DIAGNOSIS — Z0120 Encounter for dental examination and cleaning without abnormal findings: Secondary | ICD-10-CM

## 2023-01-06 DIAGNOSIS — Z113 Encounter for screening for infections with a predominantly sexual mode of transmission: Secondary | ICD-10-CM | POA: Diagnosis present

## 2023-01-06 DIAGNOSIS — Z124 Encounter for screening for malignant neoplasm of cervix: Secondary | ICD-10-CM | POA: Insufficient documentation

## 2023-01-06 DIAGNOSIS — Z0000 Encounter for general adult medical examination without abnormal findings: Secondary | ICD-10-CM

## 2023-01-06 DIAGNOSIS — Z23 Encounter for immunization: Secondary | ICD-10-CM

## 2023-01-06 DIAGNOSIS — H81399 Other peripheral vertigo, unspecified ear: Secondary | ICD-10-CM | POA: Diagnosis not present

## 2023-01-06 DIAGNOSIS — Z0100 Encounter for examination of eyes and vision without abnormal findings: Secondary | ICD-10-CM

## 2023-01-06 DIAGNOSIS — Z0001 Encounter for general adult medical examination with abnormal findings: Secondary | ICD-10-CM | POA: Diagnosis not present

## 2023-01-06 DIAGNOSIS — Z1151 Encounter for screening for human papillomavirus (HPV): Secondary | ICD-10-CM | POA: Diagnosis not present

## 2023-01-06 DIAGNOSIS — Z1231 Encounter for screening mammogram for malignant neoplasm of breast: Secondary | ICD-10-CM

## 2023-01-06 DIAGNOSIS — Z01419 Encounter for gynecological examination (general) (routine) without abnormal findings: Secondary | ICD-10-CM | POA: Insufficient documentation

## 2023-01-06 DIAGNOSIS — N186 End stage renal disease: Secondary | ICD-10-CM

## 2023-01-06 DIAGNOSIS — H538 Other visual disturbances: Secondary | ICD-10-CM

## 2023-01-06 DIAGNOSIS — Z992 Dependence on renal dialysis: Secondary | ICD-10-CM

## 2023-01-06 DIAGNOSIS — Z1211 Encounter for screening for malignant neoplasm of colon: Secondary | ICD-10-CM

## 2023-01-06 DIAGNOSIS — R42 Dizziness and giddiness: Secondary | ICD-10-CM

## 2023-01-06 NOTE — Progress Notes (Signed)
Subjective:   Cynthia Bray is a 62 y.o. female who presents for Medicare Annual (Subsequent) preventive examination.  Review of Systems    - Requests referral to eye doctor for routine exam. Endorses blurry vision without additional symptoms.  - Requests referral to dentist for routine exam.  - She was seen at the [facility] Emergency Department on 11/19/2022 - 11/20/2022 for chief complaint of dizziness. Today patient states intermittent dizziness persisting . Meclizine is helping. - Established with Nephrology renal team for chronic kidney disease management.  - No further issues/concerns for discussion today.    Cardiac Risk Factors include: hypertension     Objective:    Today's Vitals   01/06/23 0908  BP: 117/72  Pulse: 85  Resp: 16  Temp: 98.3 F (36.8 C)  SpO2: 97%  Weight: 134 lb (60.8 kg)  Height: 5' 7.01" (1.702 m)  PainSc: 0-No pain   Body mass index is 20.98 kg/m.  Physical Exam HENT:     Head: Normocephalic and atraumatic.  Eyes:     Extraocular Movements: Extraocular movements intact.     Conjunctiva/sclera: Conjunctivae normal.     Pupils: Pupils are equal, round, and reactive to light.  Cardiovascular:     Rate and Rhythm: Normal rate and regular rhythm.     Pulses: Normal pulses.     Heart sounds: Normal heart sounds.  Pulmonary:     Effort: Pulmonary effort is normal.     Breath sounds: Normal breath sounds.  Genitourinary:    General: Normal vulva.     Vagina: Normal.     Cervix: Normal.     Uterus: Normal.      Adnexa: Right adnexa normal and left adnexa normal.     Comments: Elmon Else, CMA present.  Musculoskeletal:     Cervical back: Normal range of motion and neck supple.  Neurological:     General: No focal deficit present.     Mental Status: She is alert and oriented to person, place, and time.  Psychiatric:        Mood and Affect: Mood normal.        Behavior: Behavior normal.        01/06/2023    9:09 AM 11/19/2022    11:17 PM 09/20/2022    2:00 PM 09/20/2022    1:38 AM 07/31/2022   11:26 AM 04/05/2022   11:27 PM 03/27/2022   10:22 AM  Advanced Directives  Does Patient Have a Medical Advance Directive? No No No No No No No  Would patient like information on creating a medical advance directive? Yes (Inpatient - patient defers creating a medical advance directive at this time - Information given)  Yes (Inpatient - patient requests chaplain consult to create a medical advance directive)  No - Patient declined No - Patient declined No - Patient declined    Current Medications (verified) Outpatient Encounter Medications as of 01/06/2023  Medication Sig   acetaminophen (TYLENOL) 500 MG tablet Take 1,000 mg by mouth daily as needed for mild pain, moderate pain, fever or headache.   aspirin EC 81 MG tablet Take 81 mg by mouth in the morning.   calcitRIOL (ROCALTROL) 0.5 MCG capsule Take 8 capsules (4 mcg total) by mouth daily.   Calcium Acetate 668 (169 Ca) MG TABS Take 2,004 mg by mouth with breakfast, with lunch, and with evening meal.   calcium carbonate (TUMS) 500 MG chewable tablet Chew 5 tablets (1,000 mg of elemental calcium total) by mouth 3 (  three) times daily. Take 5 tabs three times daily between meals (Patient taking differently: Chew 1,000 mg by mouth 3 (three) times daily.)   meclizine (ANTIVERT) 25 MG tablet Take 25 mg by mouth daily as needed for dizziness.   Methoxy PEG-Epoetin Beta (MIRCERA IJ) Mircera   multivitamin (RENA-VIT) TABS tablet Take 1 tablet by mouth at bedtime. (Patient taking differently: Take 1 tablet by mouth in the morning.)   simvastatin (ZOCOR) 20 MG tablet Take 20 mg by mouth in the morning.   VELPHORO 500 MG chewable tablet Chew 500 mg by mouth 3 (three) times daily with meals.   No facility-administered encounter medications on file as of 01/06/2023.    Allergies (verified) Sensipar [cinacalcet]; Aplisol [tuberculin, ppd]; Lisinopril; and Penicillin g   History: Past  Medical History:  Diagnosis Date   CHF (congestive heart failure) (HCC)    EF 25%   ESRD (end stage renal disease) (HCC)    GERD (gastroesophageal reflux disease)    Hypertension    Hyperthyroidism    Renal disorder    Past Surgical History:  Procedure Laterality Date   AV FISTULA PLACEMENT     CARDIAC CATHETERIZATION     IR FLUORO GUIDE CV LINE RIGHT  04/10/2022   IR REMOVAL TUN CV CATH W/O FL  04/29/2022   IR US GUIDE VASC ACCESS RIGHT  04/10/2022   PARATHYROIDECTOMY N/A 04/03/2022   Procedure: NECK EXPLORATION, LEFT SUPERIOR PARATHYROIDECTOMY, LEFT INFERIOR PARATHYROIDECTOMY, RIGHT SUPERIOR PARATHYROIDECTOMY;  Surgeon: Armandina Gemma, MD;  Location: [facility] OR;  Service: General;  Laterality: N/A;   VASCULAR SURGERY     fistula bilaterally. left arm removed   Family History  Problem Relation Age of Onset   Kidney disease Mother    Heart failure Sister    Heart attack Sister    Social History   Socioeconomic History   Marital status: Widowed    Spouse name: Not on file   Number of children: Not on file   Years of education: Not on file   Highest education level: Not on file  Occupational History   Not on file  Tobacco Use   Smoking status: Never    Passive exposure: Never   Smokeless tobacco: Never  Vaping Use   Vaping Use: Never used  Substance and Sexual Activity   Alcohol use: Never   Drug use: Never   Sexual activity: Not on file  Other Topics Concern   Not on file  Social History Narrative   Not on file   Social Determinants of Health   Financial Resource Strain: Not on file  Food Insecurity: No Food Insecurity (09/20/2022)   Hunger Vital Sign    Worried About Running Out of Food in the Last Year: Never true    Ran Out of Food in the Last Year: Never true  Transportation Needs: No Transportation Needs (09/20/2022)   PRAPARE - Hydrologist (Medical): No    Lack of Transportation (Non-Medical): No  Physical Activity: Not on file   Stress: Not on file  Social Connections: Not on file    Tobacco Counseling Patient states no past/present history of smoking.   Clinical Intake:  Pre-visit preparation completed: Yes  Pain : 0-10 Pain Score: 0-No pain  Diabetes: No  How often do you need to have someone help you when you read instructions, pamphlets, or other written materials from your doctor or pharmacy?: 1 - Never  Diabetic? No   Interpreter Needed?: No  Activities of Daily Living    01/06/2023    9:09 AM 09/20/2022    2:00 PM  In your present state of health, do you have any difficulty performing the following activities:  Hearing? 0 0  Vision? 0 0  Difficulty concentrating or making decisions? 0 0  Walking or climbing stairs? 0 0  Dressing or bathing? 0 0  Doing errands, shopping? 0 1  Preparing Food and eating ? N   Using the Toilet? N   In the past six months, have you accidently leaked urine? N   Do you have problems with loss of bowel control? N   Managing your Medications? N   Managing your Finances? N   Housekeeping or managing your Housekeeping? N     Patient Care Team: Camillia Herter, NP as PCP - General (Family Medicine) Skeet Latch, MD as Consulting Physician (Cardiology)  Indicate any recent Medical Services you may have received from other than [facility] providers in the past year (date may be approximate).     Assessment:   This is a routine wellness examination for Tylia.  Hearing/Vision screen - Requests referral to eye doctor.  - Hearing normal.   Dietary issues and exercise activities discussed: Current Exercise Habits: The patient does not participate in regular exercise at present, Exercise limited by: cardiac condition(s)  Goals Addressed: "To get better."     01/06/2023    9:09 AM 10/07/2022   10:11 AM 07/31/2022    8:48 AM  PHQ 2/9 Scores  PHQ - 2 Score 0 0 0    Fall Risk    01/06/2023    9:09 AM 07/31/2022    8:48 AM  Fall Risk   Falls in the  past year? 0 0  Number falls in past yr: 0 0  Injury with Fall? 0 0  Risk for fall due to : No Fall Risks No Fall Risks  Follow up Falls evaluation completed Falls evaluation completed    [place]: Any stairs in or around the home? Yes  If so, are there any without handrails?  N/A Home free of loose throw rugs in walkways, pet beds, electrical cords, etc? Yes  Adequate lighting in your home to reduce risk of falls? Yes   ASSISTIVE DEVICES UTILIZED TO PREVENT FALLS: Life alert? No  Use of a cane, walker or w/c? No  Grab bars in the bathroom? No  Shower chair or bench in shower? No  Elevated toilet seat or a handicapped toilet? No   TIMED UP AND GO: Was the test performed? Yes .  Length of time to ambulate 10 feet: 8 sec.   Gait slow and steady without use of assistive device  Cognitive Function:    01/06/2023    9:10 AM  6CIT Screen  What Year? 0 points  What month? 0 points  What time? 0 points  Count back from 20 0 points  Months in reverse 0 points  Repeat phrase 0 points  Total Score 0 points    Immunizations Immunization History  Administered Date(s) Administered   Influenza Split 08/19/2018   Influenza Whole 08/19/2018   Influenza, Quadrivalent, Recombinant, Inj, Pf 08/08/2022   Influenza,inj,Quad PF,6+ Mos 07/13/2020, 07/12/2021   Influenza-Unspecified 07/15/2019, 07/13/2020   Moderna Sars-Covid-2 Vaccination 11/25/2019, 12/23/2019   Pneumococcal Conjugate-13 12/29/2018   Pneumococcal Polysaccharide-23 03/07/2019   Tdap 01/06/2023   Zoster Recombinat (Shingrix) 01/06/2023    TDAP status: Completed at today's visit  Flu Vaccine  status: Up to date  Pneumococcal vaccine status: Up to date  Covid-19 vaccine status: Completed vaccines  Qualifies for Shingles Vaccine? Yes   Zostavax completed No   Shingrix Completed?: Yes  Screening Tests Health Maintenance  Topic Date Due   PAP SMEAR-Modifier  Never done    COLONOSCOPY (Pts 45-40yrs Insurance coverage will need to be confirmed)  Never done   MAMMOGRAM  Never done   COVID-19 Vaccine (3 - 2023-24 season) 06/13/2022   Zoster Vaccines- Shingrix (2 of 2) 03/03/2023   Medicare Annual Wellness (AWV)  01/06/2024   DTaP/Tdap/Td (2 - Td or Tdap) 01/05/2033   INFLUENZA VACCINE  Completed   Hepatitis C Screening  Completed   HIV Screening  Completed   HPV VACCINES  Aged Out    Health Maintenance  Health Maintenance Due  Topic Date Due   PAP SMEAR-Modifier  Never done   COLONOSCOPY (Pts 45-68yrs Insurance coverage will need to be confirmed)  Never done   MAMMOGRAM  Never done   COVID-19 Vaccine (3 - 2023-24 season) 06/13/2022    Colorectal cancer screening: Referral to GI placed 01/06/2023. Pt aware the office will call re: appt.  Mammogram status: Ordered 01/06/23. Pt provided with contact info and advised to call to schedule appt.   Lung Cancer Screening: (Low Dose CT Chest recommended if Age 53-80 years, 30 pack-year currently smoking OR have quit w/in 15years.) does not qualify.   Lung Cancer Screening Referral: N/A  Additional Screening:  Hepatitis C Screening: does qualify; Completed 04/04/2021  Vision Screening: Recommended annual ophthalmology exams for early detection of glaucoma and other disorders of the eye. Is the patient up to date with their annual eye exam?  No Who is the provider or what is the name of the office in which the patient attends annual eye exams? N/A If pt is not established with a provider, would they like to be referred to a provider to establish care? Yes .   Dental Screening: Recommended annual dental exams for proper oral hygiene  Community Resource Referral / Chronic Care Management: CRR required this visit?  No   CCM required this visit?  Yes   Plan:  1. Medicare annual wellness visit, subsequent - Counseled on 150 minutes of exercise per week as tolerated, healthy eating (including decreased  daily intake of saturated fats, cholesterol, added sugars, sodium), STI prevention, and routine healthcare maintenance.  2. Encounter for screening mammogram for malignant neoplasm of breast - Routine screening.  - MM Digital Screening; Future  3. Cervical cancer screening - Routine screening.  - Cytology - PAP([place])  4. Routine screening for STI (sexually transmitted infection) - Routine screening.  - Cervicovaginal ancillary only  5. Encounter for routine eye and vision examination 6. Blurry vision, bilateral - Referral to Ophthalmology for further evaluation/management.  - Ambulatory referral to Ophthalmology  7. Encounter for routine dental examination - Referral to Dentistry for further evaluation/management.  - Ambulatory referral to Dentistry  8. Colon cancer screening - Referral to Gastroenterology for colon cancer screening by colonoscopy. - Ambulatory referral to Gastroenterology  9. Peripheral vertigo, unspecified laterality 10. Dizziness - Continue present management.  - Referral to ENT for further evaluation/management.  - Ambulatory referral to ENT  11. ESRD (end stage renal disease) on dialysis ([place]) 12. Hypercalcemia - Keep all scheduled appointments with Nephrology.   13. Need for Tdap vaccination - Administered.  - Tdap vaccine greater than or equal to 7yo IM  14. Need for shingles vaccine -  Administered.  - Varicella-zoster vaccine IM   I have personally reviewed and noted the following in the patient's chart:   Medical and social history Use of alcohol, tobacco or illicit drugs  Current medications and supplements including opioid prescriptions. Patient is not currently taking opioid prescriptions. Functional ability and status Nutritional status Physical activity Advanced directives List of other physicians Hospitalizations, surgeries, and ER visits in previous 12 months Vitals Screenings to include cognitive, depression, and  falls Referrals and appointments  In addition, I have reviewed and discussed with patient certain preventive protocols, quality metrics, and best practice recommendations. A written personalized care plan for preventive services as well as general preventive health recommendations were provided to patient.   Camillia Herter, NP   01/06/2023

## 2023-01-06 NOTE — Progress Notes (Signed)
Subjective:   Cynthia Bray is a 62 y.o. female who presents for Medicare Annual (Subsequent) preventive examination. -pt request referral to Opthalmology and Dentistry  Review of Systems    Defer to Pcp  Cardiac Risk Factors include: hypertension     Objective:    Today's Vitals   01/06/23 0908  BP: 117/72  Pulse: 85  Resp: 16  Temp: 98.3 F (36.8 C)  SpO2: 97%  Weight: 134 lb (60.8 kg)  PainSc: 0-No pain   Body mass index is 20.99 kg/m.     01/06/2023    9:09 AM 11/19/2022   11:17 PM 09/20/2022    2:00 PM 09/20/2022    1:38 AM 07/31/2022   11:26 AM 04/05/2022   11:27 PM 03/27/2022   10:22 AM  Advanced Directives  Does Patient Have a Medical Advance Directive? No No No No No No No  Would patient like information on creating a medical advance directive? Yes (Inpatient - patient defers creating a medical advance directive at this time - Information given)  Yes (Inpatient - patient requests chaplain consult to create a medical advance directive)  No - Patient declined No - Patient declined No - Patient declined    Current Medications (verified) Outpatient Encounter Medications as of 01/06/2023  Medication Sig   acetaminophen (TYLENOL) 500 MG tablet Take 1,000 mg by mouth daily as needed for mild pain, moderate pain, fever or headache.   aspirin EC 81 MG tablet Take 81 mg by mouth in the morning.   calcitRIOL (ROCALTROL) 0.5 MCG capsule Take 8 capsules (4 mcg total) by mouth daily.   Calcium Acetate 668 (169 Ca) MG TABS Take 2,004 mg by mouth with breakfast, with lunch, and with evening meal.   calcium carbonate (TUMS) 500 MG chewable tablet Chew 5 tablets (1,000 mg of elemental calcium total) by mouth 3 (three) times daily. Take 5 tabs three times daily between meals (Patient taking differently: Chew 1,000 mg by mouth 3 (three) times daily.)   meclizine (ANTIVERT) 25 MG tablet Take 25 mg by mouth daily as needed for dizziness.   Methoxy PEG-Epoetin Beta (MIRCERA IJ) Mircera    multivitamin (RENA-VIT) TABS tablet Take 1 tablet by mouth at bedtime. (Patient taking differently: Take 1 tablet by mouth in the morning.)   simvastatin (ZOCOR) 20 MG tablet Take 20 mg by mouth in the morning.   VELPHORO 500 MG chewable tablet Chew 500 mg by mouth 3 (three) times daily with meals.   No facility-administered encounter medications on file as of 01/06/2023.    Allergies (verified) Sensipar [cinacalcet]; Aplisol [tuberculin, ppd]; Lisinopril; and Penicillin g   History: Past Medical History:  Diagnosis Date   CHF (congestive heart failure) (HCC)    EF 25%   ESRD (end stage renal disease) (HCC)    GERD (gastroesophageal reflux disease)    Hypertension    Hyperthyroidism    Renal disorder    Past Surgical History:  Procedure Laterality Date   AV FISTULA PLACEMENT     CARDIAC CATHETERIZATION     IR FLUORO GUIDE CV LINE RIGHT  04/10/2022   IR REMOVAL TUN CV CATH W/O FL  04/29/2022   IR US GUIDE VASC ACCESS RIGHT  04/10/2022   PARATHYROIDECTOMY N/A 04/03/2022   Procedure: NECK EXPLORATION, LEFT SUPERIOR PARATHYROIDECTOMY, LEFT INFERIOR PARATHYROIDECTOMY, RIGHT SUPERIOR PARATHYROIDECTOMY;  Surgeon: Armandina Gemma, MD;  Location: [facility];  Service: General;  Laterality: N/A;   VASCULAR SURGERY     fistula bilaterally. left arm removed  Family History  Problem Relation Age of Onset   Kidney disease Mother    Heart failure Sister    Heart attack Sister    Social History   Socioeconomic History   Marital status: Widowed    Spouse name: Not on file   Number of children: Not on file   Years of education: Not on file   Highest education level: Not on file  Occupational History   Not on file  Tobacco Use   Smoking status: Never    Passive exposure: Never   Smokeless tobacco: Never  Vaping Use   Vaping Use: Never used  Substance and Sexual Activity   Alcohol use: Never   Drug use: Never   Sexual activity: Not on file  Other Topics Concern   Not on file  Social  History Narrative   Not on file   Social Determinants of Health   Financial Resource Strain: Not on file  Food Insecurity: No Food Insecurity (09/20/2022)   Hunger Vital Sign    Worried About Running Out of Food in the Last Year: Never true    Ran Out of Food in the Last Year: Never true  Transportation Needs: No Transportation Needs (09/20/2022)   PRAPARE - Hydrologist (Medical): No    Lack of Transportation (Non-Medical): No  Physical Activity: Not on file  Stress: Not on file  Social Connections: Not on file    Tobacco Counseling Counseling given: Not Answered   Clinical Intake:  Pre-visit preparation completed: Yes  Pain : 0-10 Pain Score: 0-No pain     Diabetes: No  How often do you need to have someone help you when you read instructions, pamphlets, or other written materials from your doctor or pharmacy?: 1 - Never  Diabetic?No   Interpreter Needed?: No      Activities of Daily Living    01/06/2023    9:09 AM 09/20/2022    2:00 PM  In your present state of health, do you have any difficulty performing the following activities:  Hearing? 0 0  Vision? 0 0  Difficulty concentrating or making decisions? 0 0  Walking or climbing stairs? 0 0  Dressing or bathing? 0 0  Doing errands, shopping? 0 1  Preparing Food and eating ? N   Using the Toilet? N   In the past six months, have you accidently leaked urine? N   Do you have problems with loss of bowel control? N   Managing your Medications? N   Managing your Finances? N   Housekeeping or managing your Housekeeping? N     Patient Care Team: Camillia Herter, NP as PCP - General (Nurse Practitioner) Skeet Latch, MD as Attending Physician (Cardiology)  Indicate any recent Medical Services you may have received from other than [facility] providers in the past year (date may be approximate).     Assessment:   This is a routine wellness examination for  Cynthia Bray.  Hearing/Vision screen No results found.  Dietary issues and exercise activities discussed: Current Exercise Habits: The patient does not participate in regular exercise at present, Exercise limited by: cardiac condition(s)   Goals Addressed   None   Depression Screen    01/06/2023    9:09 AM 10/07/2022   10:11 AM 07/31/2022    8:48 AM  PHQ 2/9 Scores  PHQ - 2 Score 0 0 0    Fall Risk    01/06/2023    9:09 AM 07/31/2022  8:48 AM  Fall Risk   Falls in the past year? 0 0  Number falls in past yr: 0 0  Injury with Fall? 0 0  Risk for fall due to : No Fall Risks No Fall Risks  Follow up Falls evaluation completed Falls evaluation completed    [place]:  Any stairs in or around the home? Yes  If so, are there any without handrails?  N/A Home free of loose throw rugs in walkways, pet beds, electrical cords, etc? Yes  Adequate lighting in your home to reduce risk of falls? Yes   ASSISTIVE DEVICES UTILIZED TO PREVENT FALLS:  Life alert? No  Use of a cane, walker or w/c? No  Grab bars in the bathroom? No  Shower chair or bench in shower? No  Elevated toilet seat or a handicapped toilet? No   TIMED UP AND GO:  Was the test performed? .  Length of time to ambulate 10 feet:  sec.     Cognitive Function:        01/06/2023    9:10 AM  6CIT Screen  What Year? 0 points  What month? 0 points  What time? 0 points  Count back from 20 0 points  Months in reverse 0 points  Repeat phrase 0 points  Total Score 0 points    Immunizations Immunization History  Administered Date(s) Administered   Influenza Split 08/19/2018   Influenza Whole 08/19/2018   Influenza, Quadrivalent, Recombinant, Inj, Pf 08/08/2022   Influenza,inj,Quad PF,6+ Mos 07/13/2020, 07/12/2021   Influenza-Unspecified 07/15/2019, 07/13/2020   Moderna Sars-Covid-2 Vaccination 11/25/2019, 12/23/2019   Pneumococcal Conjugate-13 12/29/2018   Pneumococcal  Polysaccharide-23 03/07/2019   Tdap 01/06/2023   Zoster Recombinat (Shingrix) 01/06/2023    TDAP status: Completed at today's visit  Flu Vaccine status: Up to date  Pneumococcal vaccine status: Up to date  Covid-19 vaccine status: Completed vaccines  Qualifies for Shingles Vaccine? Yes   Zostavax completed No   Shingrix Completed?: Yes  Screening Tests Health Maintenance  Topic Date Due   PAP SMEAR-Modifier  Never done   COLONOSCOPY (Pts 45-10yrs Insurance coverage will need to be confirmed)  Never done   MAMMOGRAM  Never done   COVID-19 Vaccine (3 - 2023-24 season) 06/13/2022   Zoster Vaccines- Shingrix (2 of 2) 03/03/2023   Medicare Annual Wellness (AWV)  01/06/2024   DTaP/Tdap/Td (2 - Td or Tdap) 01/05/2033   INFLUENZA VACCINE  Completed   Hepatitis C Screening  Completed   HIV Screening  Completed   HPV VACCINES  Aged Out    Health Maintenance  Health Maintenance Due  Topic Date Due   PAP SMEAR-Modifier  Never done   COLONOSCOPY (Pts 45-27yrs Insurance coverage will need to be confirmed)  Never done   MAMMOGRAM  Never done   COVID-19 Vaccine (3 - 2023-24 season) 06/13/2022    Colorectal cancer screening: Referral to GI placed 01/06/2023. Pt aware the office will call re: appt.  Mammogram status: Ordered 01/06/23. Pt provided with contact info and advised to call to schedule appt.    Lung Cancer Screening: (Low Dose CT Chest recommended if Age 3-80 years, 30 pack-year currently smoking OR have quit w/in 15years.) does qualify.   Lung Cancer Screening Referral: N/A  Additional Screening:  Hepatitis C Screening: does qualify; Completed 04/04/2021  Vision Screening: Recommended annual ophthalmology exams for early detection of glaucoma and other disorders of the eye. Is the patient up to date with their annual  eye exam?  Yes  Who is the provider or what is the name of the office in which the patient attends annual eye exams? N/A If pt is not established  with a provider, would they like to be referred to a provider to establish care? Yes .   Dental Screening: Recommended annual dental exams for proper oral hygiene  Community Resource Referral / Chronic Care Management: CRR required this visit?  No   CCM required this visit?  Yes     Plan:     I have personally reviewed and noted the following in the patient's chart:   Medical and social history Use of alcohol, tobacco or illicit drugs  Current medications and supplements including opioid prescriptions. Patient is not currently taking opioid prescriptions. Functional ability and status Nutritional status Physical activity Advanced directives List of other physicians Hospitalizations, surgeries, and ER visits in previous 12 months Vitals Screenings to include cognitive, depression, and falls Referrals and appointments  In addition, I have reviewed and discussed with patient certain preventive protocols, quality metrics, and best practice recommendations. A written personalized care plan for preventive services as well as general preventive health recommendations were provided to patient.     Elmon Else, [facility]   01/06/2023

## 2023-01-07 LAB — CERVICOVAGINAL ANCILLARY ONLY
Bacterial Vaginitis (gardnerella): NEGATIVE
Candida Glabrata: NEGATIVE
Candida Vaginitis: NEGATIVE
Chlamydia: NEGATIVE
Comment: NEGATIVE
Comment: NEGATIVE
Comment: NEGATIVE
Comment: NEGATIVE
Comment: NEGATIVE
Comment: NORMAL
Neisseria Gonorrhea: NEGATIVE
Trichomonas: NEGATIVE

## 2023-01-13 LAB — CYTOLOGY - PAP
Comment: NEGATIVE
Comment: NEGATIVE
Comment: NEGATIVE
Diagnosis: NEGATIVE
HPV 16: NEGATIVE
HPV 18 / 45: POSITIVE — AB
High risk HPV: POSITIVE — AB

## 2023-01-14 ENCOUNTER — Other Ambulatory Visit: Payer: Self-pay | Admitting: Family

## 2023-01-14 DIAGNOSIS — R87810 Cervical high risk human papillomavirus (HPV) DNA test positive: Secondary | ICD-10-CM

## 2023-01-21 ENCOUNTER — Telehealth: Payer: Self-pay | Admitting: Family

## 2023-01-21 NOTE — Telephone Encounter (Signed)
Copied from CRM [id number]. Topic: General - Inquiry >> Jan 20, 2023  4:47 PM Haroldine Laws wrote: Reason for CRM: pt's son Leonette Most called asking if his mom's test results were back from about 2 weeks ago,  [phone]

## 2023-01-23 ENCOUNTER — Telehealth: Payer: Self-pay | Admitting: Gastroenterology

## 2023-01-23 ENCOUNTER — Telehealth: Payer: Self-pay

## 2023-01-23 NOTE — Telephone Encounter (Signed)
Received referral from PCP to get patient scheduled for a New GYN appointment. Left voicemail for patient with our contact information to call back and schedule the appointment.

## 2023-01-23 NOTE — Telephone Encounter (Signed)
Good Morning Dr Myrtie Neither,  Supervising MD 4/12 AM  We have received a referral from Ricky Stabs, NP for this patient to have a colonoscopy.  I spoke with patient's son who states she is supposed to have one every 5 years. Patient had a procedure with [facility] back in 2019. They wish to transfer her care to [place] due to [place] being closer given that they now live in [facility]. Records are available to view in Epic via Care Everywhere, please review them at your earliest convenience and advise on scheduling.  Thank You!

## 2023-01-23 NOTE — Telephone Encounter (Signed)
Thank you for the note.  I will review the records when I am able to do so and will reply with recommendations in a follow-up note.  - H. Danis, MD  

## 2023-01-26 NOTE — Telephone Encounter (Signed)
(  For documentation purposes: From March 2019 GI office visit [place], [place]:  "Cynthia Bray is a 62 y.o., year old female, whom presents with recurrent epigastric pain not related to eating without radiation, it can last for 20 minutes it sometimes occurs during dialysis but does not sound ischemic. She also has occasional dysphagia with difficulty swallowing large pills and sometimes throws up her food. No hematemesis or melena or hematochezia. Her last colonoscopy was in 2012 and she was noted to have polyps. No family history of colon cancer. No recent significant weight loss. She is on dialysis. He has a history of pancreatitis in the past but not recent .  COLONOSCOPY EGD revealed hiatal hernia Biopsy was taken for H. pylori Colonoscopy was normal Surgeon(s): Jacklynn Lewis, MD ")  _____________________________  Based on the 2019 records and outside GI practice, it is not clear that this patient is due for colonoscopy.  She is also on dialysis  Please make a new patient appointment with me  - H. Danis, MDS

## 2023-01-27 ENCOUNTER — Encounter: Payer: Self-pay | Admitting: Gastroenterology

## 2023-01-27 NOTE — Telephone Encounter (Signed)
LVM for son to call back and discuss Dr Myrtie Neither' note

## 2023-02-23 ENCOUNTER — Emergency Department (HOSPITAL_COMMUNITY)
Admission: EM | Admit: 2023-02-23 | Discharge: 2023-02-23 | Disposition: A | Discharge status: Home / Self Care | Payer: Medicare PPO | Attending: Emergency Medicine | Admitting: Emergency Medicine

## 2023-02-23 ENCOUNTER — Other Ambulatory Visit: Payer: Self-pay

## 2023-02-23 ENCOUNTER — Encounter (HOSPITAL_COMMUNITY): Payer: Self-pay | Admitting: *Deleted

## 2023-02-23 DIAGNOSIS — I132 Hypertensive heart and chronic kidney disease with heart failure and with stage 5 chronic kidney disease, or end stage renal disease: Secondary | ICD-10-CM | POA: Insufficient documentation

## 2023-02-23 DIAGNOSIS — I509 Heart failure, unspecified: Secondary | ICD-10-CM | POA: Diagnosis not present

## 2023-02-23 DIAGNOSIS — R112 Nausea with vomiting, unspecified: Secondary | ICD-10-CM | POA: Insufficient documentation

## 2023-02-23 DIAGNOSIS — E039 Hypothyroidism, unspecified: Secondary | ICD-10-CM | POA: Insufficient documentation

## 2023-02-23 DIAGNOSIS — Z992 Dependence on renal dialysis: Secondary | ICD-10-CM | POA: Insufficient documentation

## 2023-02-23 DIAGNOSIS — R531 Weakness: Secondary | ICD-10-CM | POA: Insufficient documentation

## 2023-02-23 DIAGNOSIS — Z7982 Long term (current) use of aspirin: Secondary | ICD-10-CM | POA: Diagnosis not present

## 2023-02-23 DIAGNOSIS — N186 End stage renal disease: Secondary | ICD-10-CM | POA: Diagnosis not present

## 2023-02-23 LAB — COMPREHENSIVE METABOLIC PANEL
ALT: 9 U/L (ref 0–44)
AST: 12 U/L — ABNORMAL LOW (ref 15–41)
Albumin: 3.4 g/dL — ABNORMAL LOW (ref 3.5–5.0)
Alkaline Phosphatase: 70 U/L (ref 38–126)
Anion gap: 18 — ABNORMAL HIGH (ref 5–15)
BUN: 53 mg/dL — ABNORMAL HIGH (ref 8–23)
CO2: 23 mmol/L (ref 22–32)
Calcium: 9.7 mg/dL (ref 8.9–10.3)
Chloride: 94 mmol/L — ABNORMAL LOW (ref 98–111)
Creatinine, Ser: 11.71 mg/dL — ABNORMAL HIGH (ref 0.44–1.00)
GFR, Estimated: 3 mL/min — ABNORMAL LOW (ref >60)
Glucose, Bld: 75 mg/dL (ref 70–99)
Potassium: 4.4 mmol/L (ref 3.5–5.1)
Sodium: 135 mmol/L (ref 135–145)
Total Bilirubin: 0.4 mg/dL (ref 0.3–1.2)
Total Protein: 7.3 g/dL (ref 6.5–8.1)

## 2023-02-23 LAB — CBC WITH DIFFERENTIAL/PLATELET
Abs Immature Granulocytes: 0.03 10*3/uL (ref 0.00–0.07)
Basophils Absolute: 0 10*3/uL (ref 0.0–0.1)
Basophils Relative: 1 %
Eosinophils Absolute: 0.1 10*3/uL (ref 0.0–0.5)
Eosinophils Relative: 1 %
HCT: 39.2 % (ref 36.0–46.0)
Hemoglobin: 12.1 g/dL (ref 12.0–15.0)
Immature Granulocytes: 1 %
Lymphocytes Relative: 22 %
Lymphs Abs: 1.3 10*3/uL (ref 0.7–4.0)
MCH: 30.1 pg (ref 26.0–34.0)
MCHC: 30.9 g/dL (ref 30.0–36.0)
MCV: 97.5 fL (ref 80.0–100.0)
Monocytes Absolute: 0.4 10*3/uL (ref 0.1–1.0)
Monocytes Relative: 6 %
Neutro Abs: 4.2 10*3/uL (ref 1.7–7.7)
Neutrophils Relative %: 69 %
Platelets: 176 10*3/uL (ref 150–400)
RBC: 4.02 MIL/uL (ref 3.87–5.11)
RDW: 15.9 % — ABNORMAL HIGH (ref 11.5–15.5)
WBC: 6 10*3/uL (ref 4.0–10.5)
nRBC: 0 % (ref 0.0–0.2)

## 2023-02-23 LAB — LIPASE, BLOOD: Lipase: 42 U/L (ref 11–51)

## 2023-02-23 LAB — CBG MONITORING, ED: Glucose-Capillary: 89 mg/dL (ref 70–99)

## 2023-02-23 MED ORDER — ONDANSETRON 4 MG PO TBDP: 4.0000 mg | ORAL_TABLET | Freq: Three times a day (TID) | ORAL | 0 refills | Status: DC | PRN

## 2023-02-23 MED ORDER — ONDANSETRON HCL 4 MG/2ML IJ SOLN
4.0000 mg | Freq: Once | INTRAMUSCULAR | Status: AC
  Administered 2023-02-23: 4 mg via INTRAVENOUS
  Filled 2023-02-23: qty 2

## 2023-02-23 NOTE — ED Provider Notes (Signed)
[place] EMERGENCY DEPARTMENT AT [facility] Provider Note   CSN: [id number] Arrival date & time: 02/23/23  1036     History  Chief Complaint  Patient presents with   Emesis    Cynthia Bray is a 62 y.o. female with ESRD on HD MWF, hypertension, hypothyroidism, heart failure who  presents with N/V, generalized weakness that started this morning as she was getting ready to go to dialysis. She denies any chest pain, shortness of breath, abdominal pain, just feels nauseous and has vomited several times.  Did not have any new or unusual foods yesterday, no one else is sick.  Denies any other flulike symptoms such as congestion, sore throat, cough.  Emesis is nonbloody nonbilious.  Denies any diarrhea or constipation, fever/chills.  Has felt like this sometimes and states that normally they just give her nausea medicine and she feels better.  Otherwise has been in her normal state of health.   Emesis      Home Medications Prior to Admission medications   Medication Sig Start Date End Date Taking? Authorizing Provider  ondansetron (ZOFRAN-ODT) 4 MG disintegrating tablet Take 1 tablet (4 mg total) by mouth every 8 (eight) hours as needed for nausea or vomiting. 02/23/23  Yes Loetta Rough, MD  acetaminophen (TYLENOL) 500 MG tablet Take 1,000 mg by mouth daily as needed for mild pain, moderate pain, fever or headache.    [provider]  aspirin EC 81 MG tablet Take 81 mg by mouth in the morning.    [provider]  calcitRIOL (ROCALTROL) 0.5 MCG capsule Take 8 capsules (4 mcg total) by mouth daily. 04/16/22   Julien Nordmann, PA-C  Calcium Acetate 668 (169 Ca) MG TABS Take 2,004 mg by mouth with breakfast, with lunch, and with evening meal.    [provider]  calcium carbonate (TUMS) 500 MG chewable tablet Chew 5 tablets (1,000 mg of elemental calcium total) by mouth 3 (three) times daily. Take 5 tabs three times daily between meals Patient taking  differently: Chew 1,000 mg by mouth 3 (three) times daily. 04/15/22 04/15/23  Julien Nordmann, PA-C  meclizine (ANTIVERT) 25 MG tablet Take 25 mg by mouth daily as needed for dizziness. 07/30/22   [provider]  Methoxy PEG-Epoetin Beta (MIRCERA IJ) Mircera 09/29/22 09/28/23  [provider]  multivitamin (RENA-VIT) TABS tablet Take 1 tablet by mouth at bedtime. Patient taking differently: Take 1 tablet by mouth in the morning. 05/10/21   Glade Lloyd, MD  simvastatin (ZOCOR) 20 MG tablet Take 20 mg by mouth in the morning.    [provider]  VELPHORO 500 MG chewable tablet Chew 500 mg by mouth 3 (three) times daily with meals. 07/14/22   [provider]      Allergies    Sensipar [cinacalcet]; Aplisol [tuberculin, ppd]; Lisinopril; and Penicillin g    Review of Systems   Review of Systems  Gastrointestinal:  Positive for vomiting.   Review of systems Negative for fevers, abdominal pain.  A 10 point review of systems was performed and is negative unless otherwise reported in HPI.  Physical Exam Updated Vital Signs BP 109/68 (BP Location: Right Arm)   Pulse 83   Temp 98.1 F (36.7 C) (Oral)   Resp 14   Ht 5\' 7"  (1.702 m)   Wt 60.8 kg   SpO2 96%   BMI 20.99 kg/m  Physical Exam General: Normal appearing female, lying in bed.  HEENT: PERRLA, EOMI, no  nystagmus, Sclera anicteric, MMM, trachea midline.  Cardiology: RRR, no murmurs/rubs/gallops. BL radial and DP pulses equal bilaterally.  Resp: Normal respiratory rate and effort. CTAB, no wheezes, rhonchi, crackles.  Abd: Soft, non-tender, non-distended. No rebound tenderness or guarding.  GU: Deferred. MSK: No peripheral edema or signs of trauma. Extremities without deformity or TTP. No cyanosis or clubbing. Skin: warm, dry.  Neuro: A&Ox4, CNs II-XII grossly intact. MAEs. Sensation grossly intact.  Psych: Normal mood and affect.   ED Results / Procedures / Treatments   Labs (all labs ordered  are listed, but only abnormal results are displayed) Labs Reviewed  CBC WITH DIFFERENTIAL/PLATELET - Abnormal; Notable for the following components:      Result Value   RDW 15.9 (*)    All other components within normal limits  COMPREHENSIVE METABOLIC PANEL - Abnormal; Notable for the following components:   Chloride 94 (*)    BUN 53 (*)    Creatinine, Ser 11.71 (*)    Albumin 3.4 (*)    AST 12 (*)    GFR, Estimated 3 (*)    Anion gap 18 (*)    All other components within normal limits  LIPASE, BLOOD  CBG MONITORING, ED    EKG EKG Interpretation  Date/Time:  Monday Feb 23 2023 11:19:31 EDT Ventricular Rate:  80 PR Interval:  155 QRS Duration: 97 QT Interval:  373 QTC Calculation: 431 R Axis:   58 Text Interpretation: Sinus rhythm Low voltage, precordial leads Confirmed by Vivi Barrack [phone]) on 02/23/2023 11:20:09 AM  Radiology No results found.  Procedures Procedures    Medications Ordered in ED Medications  ondansetron (ZOFRAN) injection 4 mg (4 mg Intravenous Given 02/23/23 1248)    ED Course/ Medical Decision Making/ A&P                          Medical Decision Making Amount and/or Complexity of Data Reviewed Labs: ordered. Decision-making details documented in ED Course.  Risk Prescription drug management.    This patient presents to the ED for concern of nausea vomiting generalized weakness, this involves an extensive number of treatment options, and is a complaint that carries with it a high risk of complications and morbidity.  I considered the following differential and admission for this acute, potentially life threatening condition.   MDM:    For this patient's emesis/generalized weakness consider electrolyte derangements, GERD/gastritis, gastroenteritis, pancreatitis, anemia, hypo/hyperglycemia, ACS or arrhythmia which is lower likelihood with no chest pain or shortness of breath.  She has no abdominal pain to palpation, no report of abdominal  pain and no fevers or chills, I have overall low concern for intra-abdominal infection such as cholecystitis, cholelithiasis, pyelonephritis, appendicitis, diverticulitis.  She has no abdominal pain/bloating or constipation to indicate small bowel obstruction.  Patient is very well-appearing and hemodynamically stable, afebrile.  She states this has happened before dialysis sometimes. She requested nausea medicine and we will give her IV Zofran and reassess.  Patient states that she was able to call her dialysis center and get rescheduled for tomorrow.  Clinical Course as of 03/07/23 1805  Mon Feb 23, 2023  1119 Glucose-Capillary: 89 [HN]  1403 Lipase: 42 [HN]  1403 Creatinine(!): 11.71 HD patient [HN]  1403 Potassium: 4.4 [HN]  1403 Sodium: 135 [HN]    Clinical Course User Index [HN] Loetta Rough, MD    Labs: I Ordered, and personally interpreted labs.  The pertinent results include: Those listed above  Additional history obtained from chart review, son at bedside.    Cardiac Monitoring: The patient was maintained on a cardiac monitor.  I personally viewed and interpreted the cardiac monitored which showed an underlying rhythm of: Normal sinus rhythm  Reevaluation: After the interventions noted above, I reevaluated the patient and found that they have :resolved  Social Determinants of Health:  patient lives independently  Disposition: Patient's labs are unremarkable in the context of her ESRD.  She feels improved after IV Zofran and was able to take p.o. without difficulty.  Patient already has dialysis scheduled for tomorrow and her labs do not indicate that she needs any emergent dialysis. I have low suspicion for emergent pathology and believe she is stable for discharge w/ o/p f/u.  Patient will be discharged with ODT Zofran for home and encouraged to stay well-hydrated and eat.  DC w/ discharge instructions/return precautions. All questions answered to patient's satisfaction.      Co morbidities that complicate the patient evaluation  Past Medical History:  Diagnosis Date   CHF (congestive heart failure) (HCC)    EF 25%   ESRD (end stage renal disease) (HCC)    GERD (gastroesophageal reflux disease)    Hypertension    Hyperthyroidism    Renal disorder      Medicines Meds ordered this encounter  Medications   ondansetron (ZOFRAN) injection 4 mg   ondansetron (ZOFRAN-ODT) 4 MG disintegrating tablet    Sig: Take 1 tablet (4 mg total) by mouth every 8 (eight) hours as needed for nausea or vomiting.    Dispense:  20 tablet    Refill:  0    I have reviewed the patients home medicines and have made adjustments as needed  Problem List / ED Course: Problem List Items Addressed This Visit   None Visit Diagnoses     Nausea and vomiting, unspecified vomiting type    -  Primary   Generalized weakness                       This note was created using dictation software, which may contain spelling or grammatical errors.    Loetta Rough, MD 03/07/23 [phone]

## 2023-02-23 NOTE — ED Triage Notes (Signed)
Patient presents to ed c/o waking up this am with vomiting states she is weak was suppose to go to dialysis today. Denies pain

## 2023-02-23 NOTE — Discharge Instructions (Addendum)
Thank you for coming to [facility] Emergency Department. You were seen for generalized weakness, nausea and vomiting. We did an exam, labs, and imaging, and these showed no acute findings.  Please follow up with your primary care provider within 1 week.  We have prescribed Zofran under the tongue 4 mg every 6-8 hours as needed for nausea vomiting.  Please go to your dialysis tomorrow.  Do not hesitate to return to the ED or call 911 if you experience: -Worsening symptoms -Abdominal pain -Nausea/vomiting so severe you cannot eat/drink anything -Lightheadedness, passing out -Fevers/chills -Anything else that concerns you

## 2023-04-13 DIAGNOSIS — N186 End stage renal disease: Secondary | ICD-10-CM | POA: Diagnosis not present

## 2023-04-13 DIAGNOSIS — N2581 Secondary hyperparathyroidism of renal origin: Secondary | ICD-10-CM | POA: Diagnosis not present

## 2023-04-13 DIAGNOSIS — Z992 Dependence on renal dialysis: Secondary | ICD-10-CM | POA: Diagnosis not present

## 2023-04-15 DIAGNOSIS — Z992 Dependence on renal dialysis: Secondary | ICD-10-CM | POA: Diagnosis not present

## 2023-04-15 DIAGNOSIS — N186 End stage renal disease: Secondary | ICD-10-CM | POA: Diagnosis not present

## 2023-04-15 DIAGNOSIS — N2581 Secondary hyperparathyroidism of renal origin: Secondary | ICD-10-CM | POA: Diagnosis not present

## 2023-04-17 DIAGNOSIS — N2581 Secondary hyperparathyroidism of renal origin: Secondary | ICD-10-CM | POA: Diagnosis not present

## 2023-04-17 DIAGNOSIS — Z992 Dependence on renal dialysis: Secondary | ICD-10-CM | POA: Diagnosis not present

## 2023-04-17 DIAGNOSIS — N186 End stage renal disease: Secondary | ICD-10-CM | POA: Diagnosis not present

## 2023-04-20 DIAGNOSIS — N2581 Secondary hyperparathyroidism of renal origin: Secondary | ICD-10-CM | POA: Diagnosis not present

## 2023-04-20 DIAGNOSIS — N186 End stage renal disease: Secondary | ICD-10-CM | POA: Diagnosis not present

## 2023-04-20 DIAGNOSIS — Z992 Dependence on renal dialysis: Secondary | ICD-10-CM | POA: Diagnosis not present

## 2023-04-22 DIAGNOSIS — Z992 Dependence on renal dialysis: Secondary | ICD-10-CM | POA: Diagnosis not present

## 2023-04-22 DIAGNOSIS — N186 End stage renal disease: Secondary | ICD-10-CM | POA: Diagnosis not present

## 2023-04-22 DIAGNOSIS — N2581 Secondary hyperparathyroidism of renal origin: Secondary | ICD-10-CM | POA: Diagnosis not present

## 2023-04-23 ENCOUNTER — Ambulatory Visit: Payer: Medicare PPO | Admitting: Gastroenterology

## 2023-04-23 NOTE — Progress Notes (Deleted)
[facility] Consult Note:  History: Reika Callanan 04/23/2023  Referring provider: Rema Fendt, NP  Reason for consult/chief complaint: No chief complaint on file.   Subjective  HPI: Referral from primary care received 2 months ago and this patient requesting routine colonoscopy, as patient had indicated her belief that one was needed every 5 years.  My record review and subsequent note were as follows: " (For documentation purposes: From March 2019 GI office visit [place], [place]:   "DORISANN SCHWANKE is a 62 y.o., year old female, whom presents with recurrent epigastric pain not related to eating without radiation, it can last for 20 minutes it sometimes occurs during dialysis but does not sound ischemic. She also has occasional dysphagia with difficulty swallowing large pills and sometimes throws up her food. No hematemesis or melena or hematochezia. Her last colonoscopy was in 2012 and she was noted to have polyps. No family history of colon cancer. No recent significant weight loss. She is on dialysis. He has a history of pancreatitis in the past but not recent .   COLONOSCOPY EGD revealed hiatal hernia Biopsy was taken for H. pylori Colonoscopy was normal Surgeon(s): Jacklynn Lewis, MD ")   _____________________________   Based on the 2019 records and outside GI practice, it is not clear that this patient is due for colonoscopy.   She is also on dialysis   Please make a new patient appointment with me    This patient had an ED visit on 02/23/2023 for epigastric pain with nausea and vomiting on the morning prior to attending dialysis.  Patient reportedly stable, labs stable, no abdominal imaging done that visit. __________________   ***   ROS:  Review of Systems   Past Medical History: Past Medical History:  Diagnosis Date   CHF (congestive heart failure) (HCC)    EF 25%   ESRD (end stage renal disease) (HCC)    GERD (gastroesophageal  reflux disease)    Hypertension    Hyperthyroidism    Renal disorder      Past Surgical History: Past Surgical History:  Procedure Laterality Date   AV FISTULA PLACEMENT     CARDIAC CATHETERIZATION     IR FLUORO GUIDE CV LINE RIGHT  04/10/2022   IR REMOVAL TUN CV CATH W/O FL  04/29/2022   IR US GUIDE VASC ACCESS RIGHT  04/10/2022   PARATHYROIDECTOMY N/A 04/03/2022   Procedure: NECK EXPLORATION, LEFT SUPERIOR PARATHYROIDECTOMY, LEFT INFERIOR PARATHYROIDECTOMY, RIGHT SUPERIOR PARATHYROIDECTOMY;  Surgeon: Darnell Level, MD;  Location: [facility] OR;  Service: General;  Laterality: N/A;   VASCULAR SURGERY     fistula bilaterally. left arm removed     Family History: Family History  Problem Relation Age of Onset   Kidney disease Mother    Heart failure Sister    Heart attack Sister     Social History: Social History   Socioeconomic History   Marital status: Widowed    Spouse name: Not on file   Number of children: Not on file   Years of education: Not on file   Highest education level: Not on file  Occupational History   Not on file  Tobacco Use   Smoking status: Never    Passive exposure: Never   Smokeless tobacco: Never  Vaping Use   Vaping status: Never Used  Substance and Sexual Activity   Alcohol use: Never   Drug use: Never   Sexual activity: Not on file  Other Topics Concern   Not  on file  Social History Narrative   Not on file   Social Determinants of Health   Financial Resource Strain: Not on file  Food Insecurity: No Food Insecurity (09/20/2022)   Hunger Vital Sign    Worried About Running Out of Food in the Last Year: Never true    Ran Out of Food in the Last Year: Never true  Transportation Needs: No Transportation Needs (09/20/2022)   PRAPARE - Administrator, Civil Service (Medical): No    Lack of Transportation (Non-Medical): No  Physical Activity: Not on file  Stress: Not on file  Social Connections: Not on file    Allergies: Allergies   Allergen Reactions   Sensipar [Cinacalcet] Nausea And Vomiting   Aplisol [Tuberculin, Ppd] Swelling   Lisinopril Swelling    Angioedema    Penicillin G Swelling    Outpatient Meds: Current Outpatient Medications  Medication Sig Dispense Refill   acetaminophen (TYLENOL) 500 MG tablet Take 1,000 mg by mouth daily as needed for mild pain, moderate pain, fever or headache.     aspirin EC 81 MG tablet Take 81 mg by mouth in the morning.     calcitRIOL (ROCALTROL) 0.5 MCG capsule Take 8 capsules (4 mcg total) by mouth daily. 30 capsule 6   Calcium Acetate 668 (169 Ca) MG TABS Take 2,004 mg by mouth with breakfast, with lunch, and with evening meal.     meclizine (ANTIVERT) 25 MG tablet Take 25 mg by mouth daily as needed for dizziness.     Methoxy PEG-Epoetin Beta (MIRCERA IJ) Mircera     multivitamin (RENA-VIT) TABS tablet Take 1 tablet by mouth at bedtime. (Patient taking differently: Take 1 tablet by mouth in the morning.) 30 tablet 0   ondansetron (ZOFRAN-ODT) 4 MG disintegrating tablet Take 1 tablet (4 mg total) by mouth every 8 (eight) hours as needed for nausea or vomiting. 20 tablet 0   simvastatin (ZOCOR) 20 MG tablet Take 20 mg by mouth in the morning.     VELPHORO 500 MG chewable tablet Chew 500 mg by mouth 3 (three) times daily with meals.     No current facility-administered medications for this visit.      ___________________________________________________________________ Objective   Exam:  There were no vitals taken for this visit. Wt Readings from Last 3 Encounters:  02/23/23 134 lb 0.6 oz (60.8 kg)  01/06/23 134 lb (60.8 kg)  11/19/22 133 lb 13.1 oz (60.7 kg)    General: ***  Eyes: sclera anicteric, no redness ENT: oral mucosa moist without lesions, no cervical or supraclavicular lymphadenopathy CV: ***, no JVD, no peripheral edema Resp: clear to auscultation bilaterally, normal RR and effort noted GI: soft, *** tenderness, with active bowel sounds. No  guarding or palpable organomegaly noted. Skin; warm and dry, no rash or jaundice noted Neuro: awake, alert and oriented x 3. Normal gross motor function and fluent speech  Labs:     Latest Ref Rng & Units 02/23/2023   12:30 PM 11/19/2022   11:41 PM 09/21/2022    6:12 AM  CBC  WBC 4.0 - 10.5 K/uL 6.0  7.8  6.0   Hemoglobin 12.0 - 15.0 g/dL 16.1  09.6  04.5   Hematocrit 36.0 - 46.0 % 39.2  38.9  33.2   Platelets 150 - 400 K/uL 176  225  185       Latest Ref Rng & Units 02/23/2023   12:30 PM 11/20/2022    1:13 AM 11/19/2022  11:41 PM  CMP  Glucose 70 - 99 mg/dL 75  409  811   BUN 8 - 23 mg/dL 53  28  25   Creatinine 0.44 - 1.00 mg/dL 91.47  8.29  5.62   Sodium 135 - 145 mmol/L 135  136  138   Potassium 3.5 - 5.1 mmol/L 4.4  4.9  4.3   Chloride 98 - 111 mmol/L 94  94  93   CO2 22 - 32 mmol/L 23  27  29    Calcium 8.9 - 10.3 mg/dL 9.7  13.0  86.5   Total Protein 6.5 - 8.1 g/dL 7.3     Total Bilirubin 0.3 - 1.2 mg/dL 0.4     Alkaline Phos 38 - 126 U/L 70     AST 15 - 41 U/L 12     ALT 0 - 44 U/L 9        Radiologic Studies:   CLINICAL DATA:  62 year old female with left lower quadrant pain. Chronic diarrhea.   EXAM: CT ABDOMEN AND PELVIS WITHOUT CONTRAST   TECHNIQUE: Multidetector CT imaging of the abdomen and pelvis was performed following the standard protocol without IV contrast.   RADIATION DOSE REDUCTION: This exam was performed according to the departmental dose-optimization program which includes automated exposure control, adjustment of the mA and/or kV according to patient size and/or use of iterative reconstruction technique.   COMPARISON:  CTA chest 05/07/2021.   FINDINGS: Lower chest: Substantially more normal lung bases compared to the CTA last year, mild lung base atelectasis or scarring now. Small to moderate gastric hiatal hernia. No pericardial or pleural effusion.   Hepatobiliary: Noncontrast liver appears stable with unchanged small right hepatic  lobe circumscribed low-density benign cyst or hemangioma series 3, image 20 (no follow-up imaging recommended). Negative gallbladder.   Pancreas: Pancreatic body and tail appear relatively normal. Pancreatic head and uncinate demonstrate multiple dystrophic calcifications. No obvious pancreatic mass or ductal dilatation. No inflammation.   Spleen: Negative.   Adrenals/Urinary Tract: Normal adrenal glands. Bilateral renal atrophy. Multiple small frequently exophytic and benign appearing renal cysts (no follow-up imaging recommended). Mild bilateral nephrocalcinosis. No hydronephrosis, hydroureter, pararenal inflammation. Completely decompressed bladder. Incidental pelvic phleboliths.   Stomach/Bowel: Redundant but nondilated large bowel. There is fluid in the it right colon and transverse colon, but formed stool from the splenic flexure distally. No large bowel inflammation is identified, the appendix is normal on coronal image 30. Terminal ileum is fairly decompressed and appears negative.   No dilated small bowel. Gastric hiatal hernia. Otherwise negative stomach and duodenum. No free air or free fluid identified.   Vascular/Lymphatic: Normal caliber abdominal aorta. Aortoiliac calcified atherosclerosis. Vascular patency is not evaluated in the absence of IV contrast. No lymphadenopathy identified.   Reproductive: Very bulky fibroid uterus with multiple subserosal calcified uterine fibroids measuring between 5 and 7 cm diameter. Negative noncontrast adnexa.   Other: No pelvic free fluid. Numerous pelvic vascular calcifications.   Musculoskeletal: Diffusely abnormal bone mineralization, similar to the CTA last year. Favor renal osteodystrophy. Widespread chronic appearing vertebral endplate deformities. No destructive osseous lesion identified.   IMPRESSION: 1. Fluid in the right and transverse colon which may indicate enteritis/diarrhea. Retained stool in the distal  colon. Normal appendix.   2. No other acute or inflammatory process identified in the non-contrast abdomen or pelvis. But other notable findings include: Bilateral renal atrophy, renal osteodystrophy. Chronic calcific pancreatitis. Very bulky fibroid uterus. Small to moderate gastric hiatal hernia. Aortic Atherosclerosis (ICD10-I70.0).  Electronically Signed   By: Odessa Fleming M.D.   On: 09/20/2022 05:12  ________________________________  IMPRESSIONS   December 2023 echocardiogram report:   1. Left ventricular ejection fraction, by estimation, is 60 to 65%. The  left ventricle has normal function. The left ventricle has no regional  wall motion abnormalities. Left ventricular diastolic parameters are  consistent with Grade I diastolic  dysfunction (impaired relaxation).   2. Right ventricular systolic function is normal. The right ventricular  size is normal. There is moderately elevated pulmonary artery systolic  pressure.   3. The mitral valve is degenerative. Trivial mitral valve regurgitation.   4. The aortic valve is tricuspid. There is mild thickening of the aortic  valve. Aortic valve regurgitation is trivial. Aortic valve sclerosis is  present, with no evidence of aortic valve stenosis.   5. The inferior vena cava is normal in size with greater than 50%  respiratory variability, suggesting right atrial pressure of 3 mmHg.   Comparison(s): Prior images reviewed side by side. Compared to prior TTE  on 04/2021, the LVEF has improved significantly from 25-30% to 60-65%.   Assessment: No diagnosis found.  *** Sequelae of chronic pancreatitis seen on CTAP December 2023 Plan:  ***  Thank you for the courtesy of this consult.  Please call me with any questions or concerns.  Charlie Pitter III  CC: Referring provider noted above

## 2023-04-24 DIAGNOSIS — N2581 Secondary hyperparathyroidism of renal origin: Secondary | ICD-10-CM | POA: Diagnosis not present

## 2023-04-24 DIAGNOSIS — Z992 Dependence on renal dialysis: Secondary | ICD-10-CM | POA: Diagnosis not present

## 2023-04-24 DIAGNOSIS — N186 End stage renal disease: Secondary | ICD-10-CM | POA: Diagnosis not present

## 2023-04-27 DIAGNOSIS — N2581 Secondary hyperparathyroidism of renal origin: Secondary | ICD-10-CM | POA: Diagnosis not present

## 2023-04-27 DIAGNOSIS — N186 End stage renal disease: Secondary | ICD-10-CM | POA: Diagnosis not present

## 2023-04-27 DIAGNOSIS — Z992 Dependence on renal dialysis: Secondary | ICD-10-CM | POA: Diagnosis not present

## 2023-04-29 DIAGNOSIS — N186 End stage renal disease: Secondary | ICD-10-CM | POA: Diagnosis not present

## 2023-04-29 DIAGNOSIS — Z992 Dependence on renal dialysis: Secondary | ICD-10-CM | POA: Diagnosis not present

## 2023-04-29 DIAGNOSIS — N2581 Secondary hyperparathyroidism of renal origin: Secondary | ICD-10-CM | POA: Diagnosis not present

## 2023-05-01 DIAGNOSIS — N186 End stage renal disease: Secondary | ICD-10-CM | POA: Diagnosis not present

## 2023-05-01 DIAGNOSIS — Z992 Dependence on renal dialysis: Secondary | ICD-10-CM | POA: Diagnosis not present

## 2023-05-01 DIAGNOSIS — N2581 Secondary hyperparathyroidism of renal origin: Secondary | ICD-10-CM | POA: Diagnosis not present

## 2023-05-04 DIAGNOSIS — N2581 Secondary hyperparathyroidism of renal origin: Secondary | ICD-10-CM | POA: Diagnosis not present

## 2023-05-04 DIAGNOSIS — N186 End stage renal disease: Secondary | ICD-10-CM | POA: Diagnosis not present

## 2023-05-04 DIAGNOSIS — Z992 Dependence on renal dialysis: Secondary | ICD-10-CM | POA: Diagnosis not present

## 2023-05-06 DIAGNOSIS — N2581 Secondary hyperparathyroidism of renal origin: Secondary | ICD-10-CM | POA: Diagnosis not present

## 2023-05-06 DIAGNOSIS — Z992 Dependence on renal dialysis: Secondary | ICD-10-CM | POA: Diagnosis not present

## 2023-05-06 DIAGNOSIS — N186 End stage renal disease: Secondary | ICD-10-CM | POA: Diagnosis not present

## 2023-05-08 DIAGNOSIS — N186 End stage renal disease: Secondary | ICD-10-CM | POA: Diagnosis not present

## 2023-05-08 DIAGNOSIS — N2581 Secondary hyperparathyroidism of renal origin: Secondary | ICD-10-CM | POA: Diagnosis not present

## 2023-05-08 DIAGNOSIS — Z992 Dependence on renal dialysis: Secondary | ICD-10-CM | POA: Diagnosis not present

## 2023-05-11 DIAGNOSIS — N186 End stage renal disease: Secondary | ICD-10-CM | POA: Diagnosis not present

## 2023-05-11 DIAGNOSIS — Z992 Dependence on renal dialysis: Secondary | ICD-10-CM | POA: Diagnosis not present

## 2023-05-11 DIAGNOSIS — N2581 Secondary hyperparathyroidism of renal origin: Secondary | ICD-10-CM | POA: Diagnosis not present

## 2023-05-13 DIAGNOSIS — N186 End stage renal disease: Secondary | ICD-10-CM | POA: Diagnosis not present

## 2023-05-13 DIAGNOSIS — I129 Hypertensive chronic kidney disease with stage 1 through stage 4 chronic kidney disease, or unspecified chronic kidney disease: Secondary | ICD-10-CM | POA: Diagnosis not present

## 2023-05-13 DIAGNOSIS — Z992 Dependence on renal dialysis: Secondary | ICD-10-CM | POA: Diagnosis not present

## 2023-05-13 DIAGNOSIS — N2581 Secondary hyperparathyroidism of renal origin: Secondary | ICD-10-CM | POA: Diagnosis not present

## 2023-05-15 DIAGNOSIS — N2581 Secondary hyperparathyroidism of renal origin: Secondary | ICD-10-CM | POA: Diagnosis not present

## 2023-05-15 DIAGNOSIS — N186 End stage renal disease: Secondary | ICD-10-CM | POA: Diagnosis not present

## 2023-05-15 DIAGNOSIS — Z992 Dependence on renal dialysis: Secondary | ICD-10-CM | POA: Diagnosis not present

## 2023-05-18 DIAGNOSIS — N186 End stage renal disease: Secondary | ICD-10-CM | POA: Diagnosis not present

## 2023-05-18 DIAGNOSIS — N2581 Secondary hyperparathyroidism of renal origin: Secondary | ICD-10-CM | POA: Diagnosis not present

## 2023-05-18 DIAGNOSIS — Z992 Dependence on renal dialysis: Secondary | ICD-10-CM | POA: Diagnosis not present

## 2023-05-19 DIAGNOSIS — T82858A Stenosis of vascular prosthetic devices, implants and grafts, initial encounter: Secondary | ICD-10-CM | POA: Diagnosis not present

## 2023-05-19 DIAGNOSIS — I871 Compression of vein: Secondary | ICD-10-CM | POA: Diagnosis not present

## 2023-05-19 DIAGNOSIS — N186 End stage renal disease: Secondary | ICD-10-CM | POA: Diagnosis not present

## 2023-05-19 DIAGNOSIS — Z992 Dependence on renal dialysis: Secondary | ICD-10-CM | POA: Diagnosis not present

## 2023-05-20 DIAGNOSIS — N186 End stage renal disease: Secondary | ICD-10-CM | POA: Diagnosis not present

## 2023-05-20 DIAGNOSIS — N2581 Secondary hyperparathyroidism of renal origin: Secondary | ICD-10-CM | POA: Diagnosis not present

## 2023-05-20 DIAGNOSIS — Z992 Dependence on renal dialysis: Secondary | ICD-10-CM | POA: Diagnosis not present

## 2023-05-22 DIAGNOSIS — N2581 Secondary hyperparathyroidism of renal origin: Secondary | ICD-10-CM | POA: Diagnosis not present

## 2023-05-22 DIAGNOSIS — Z992 Dependence on renal dialysis: Secondary | ICD-10-CM | POA: Diagnosis not present

## 2023-05-22 DIAGNOSIS — N186 End stage renal disease: Secondary | ICD-10-CM | POA: Diagnosis not present

## 2023-05-25 DIAGNOSIS — N186 End stage renal disease: Secondary | ICD-10-CM | POA: Diagnosis not present

## 2023-05-25 DIAGNOSIS — Z992 Dependence on renal dialysis: Secondary | ICD-10-CM | POA: Diagnosis not present

## 2023-05-25 DIAGNOSIS — N2581 Secondary hyperparathyroidism of renal origin: Secondary | ICD-10-CM | POA: Diagnosis not present

## 2023-05-27 DIAGNOSIS — N186 End stage renal disease: Secondary | ICD-10-CM | POA: Diagnosis not present

## 2023-05-27 DIAGNOSIS — N2581 Secondary hyperparathyroidism of renal origin: Secondary | ICD-10-CM | POA: Diagnosis not present

## 2023-05-27 DIAGNOSIS — Z992 Dependence on renal dialysis: Secondary | ICD-10-CM | POA: Diagnosis not present

## 2023-05-29 DIAGNOSIS — N2581 Secondary hyperparathyroidism of renal origin: Secondary | ICD-10-CM | POA: Diagnosis not present

## 2023-05-29 DIAGNOSIS — N186 End stage renal disease: Secondary | ICD-10-CM | POA: Diagnosis not present

## 2023-05-29 DIAGNOSIS — Z992 Dependence on renal dialysis: Secondary | ICD-10-CM | POA: Diagnosis not present

## 2023-06-01 DIAGNOSIS — N2581 Secondary hyperparathyroidism of renal origin: Secondary | ICD-10-CM | POA: Diagnosis not present

## 2023-06-01 DIAGNOSIS — N186 End stage renal disease: Secondary | ICD-10-CM | POA: Diagnosis not present

## 2023-06-01 DIAGNOSIS — Z992 Dependence on renal dialysis: Secondary | ICD-10-CM | POA: Diagnosis not present

## 2023-06-03 DIAGNOSIS — N2581 Secondary hyperparathyroidism of renal origin: Secondary | ICD-10-CM | POA: Diagnosis not present

## 2023-06-03 DIAGNOSIS — Z992 Dependence on renal dialysis: Secondary | ICD-10-CM | POA: Diagnosis not present

## 2023-06-03 DIAGNOSIS — N186 End stage renal disease: Secondary | ICD-10-CM | POA: Diagnosis not present

## 2023-06-05 DIAGNOSIS — Z992 Dependence on renal dialysis: Secondary | ICD-10-CM | POA: Diagnosis not present

## 2023-06-05 DIAGNOSIS — N186 End stage renal disease: Secondary | ICD-10-CM | POA: Diagnosis not present

## 2023-06-05 DIAGNOSIS — N2581 Secondary hyperparathyroidism of renal origin: Secondary | ICD-10-CM | POA: Diagnosis not present

## 2023-06-08 DIAGNOSIS — Z992 Dependence on renal dialysis: Secondary | ICD-10-CM | POA: Diagnosis not present

## 2023-06-08 DIAGNOSIS — N186 End stage renal disease: Secondary | ICD-10-CM | POA: Diagnosis not present

## 2023-06-08 DIAGNOSIS — N2581 Secondary hyperparathyroidism of renal origin: Secondary | ICD-10-CM | POA: Diagnosis not present

## 2023-06-08 IMAGING — CR DG CHEST 2V
2 series · 2 of 2 positions shown · non-contrast
Comparison: Single-view of the chest earlier today and 04/25/2021.

CLINICAL DATA: Chest pain since yesterday.

EXAM:
CHEST - 2 VIEW

[w chest lat]
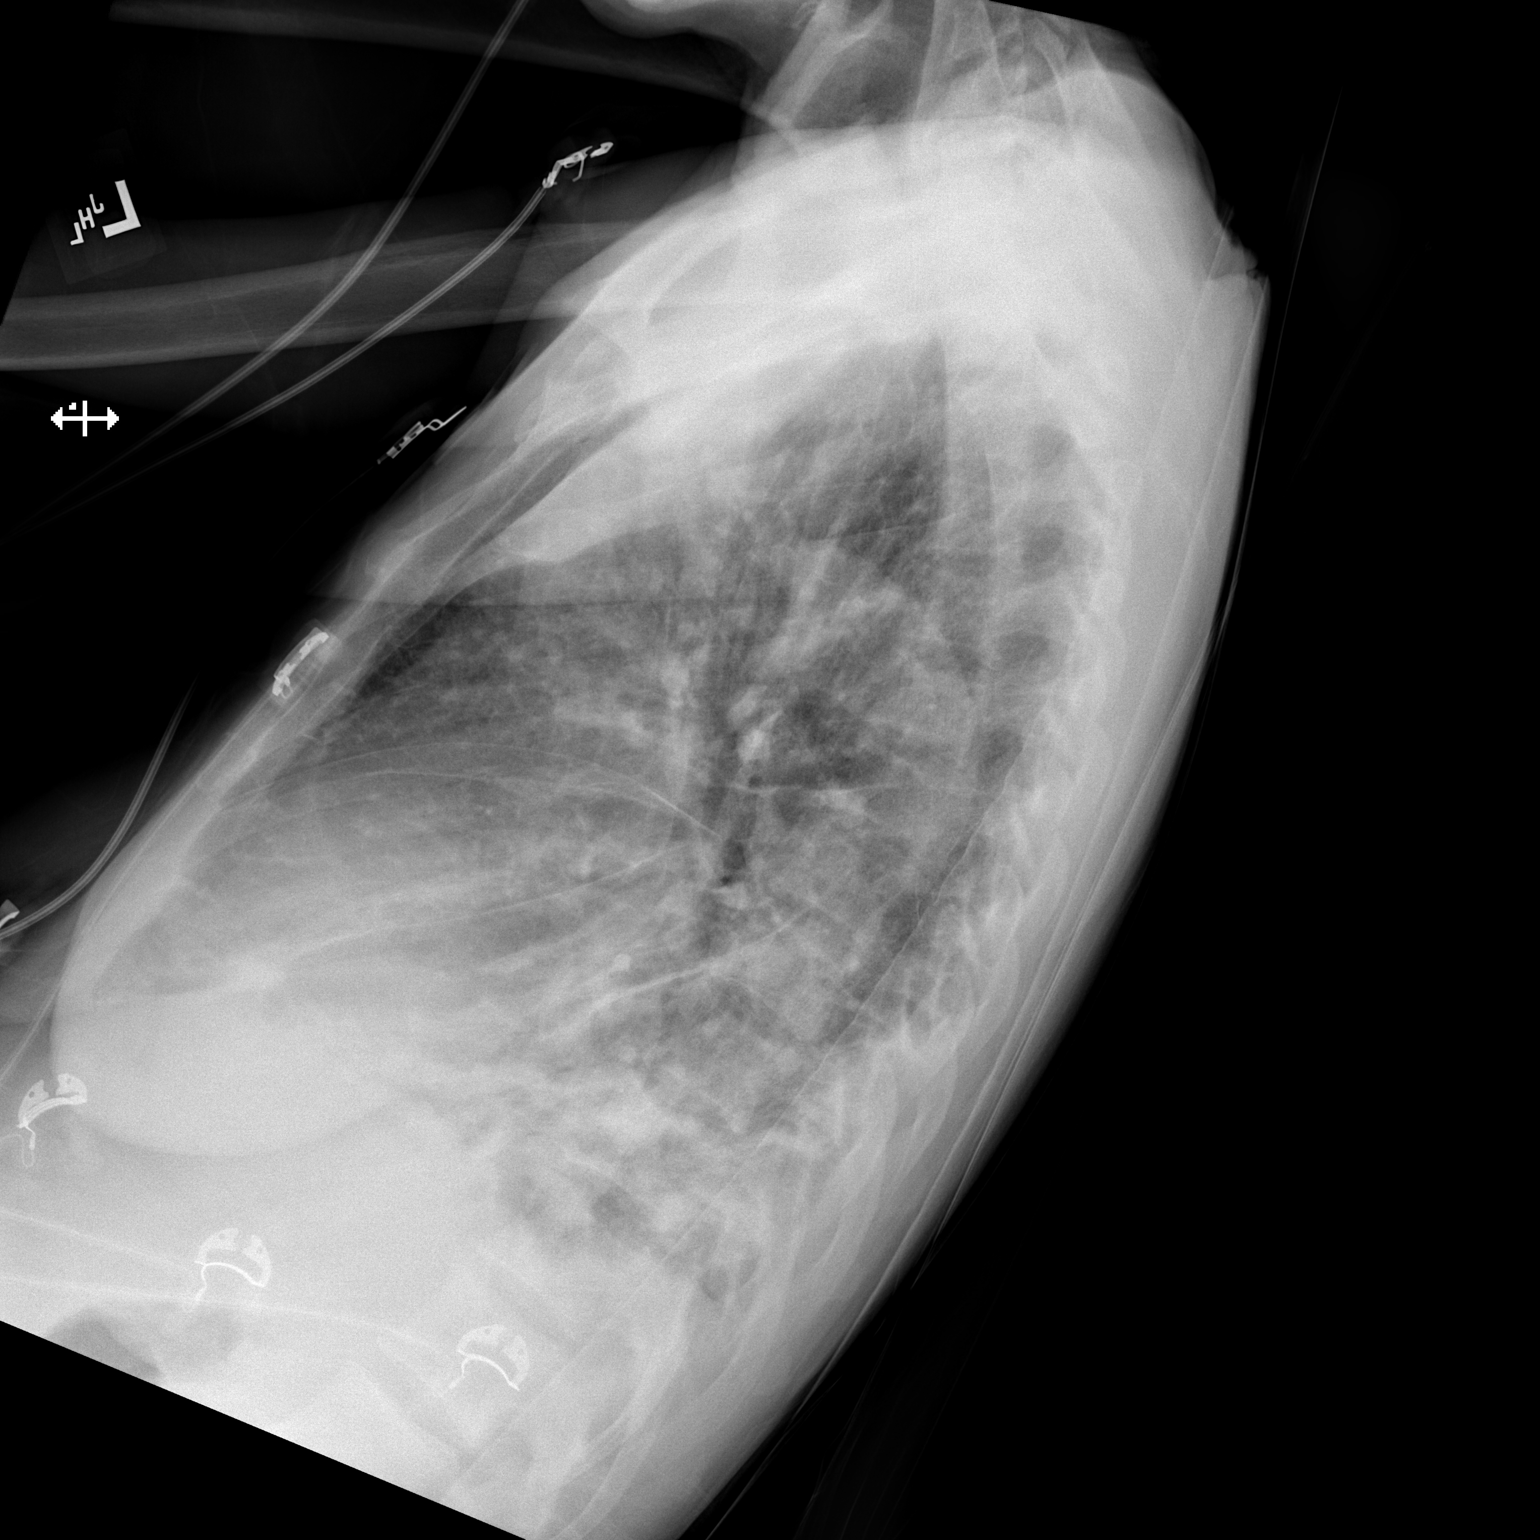

[x chest ap]
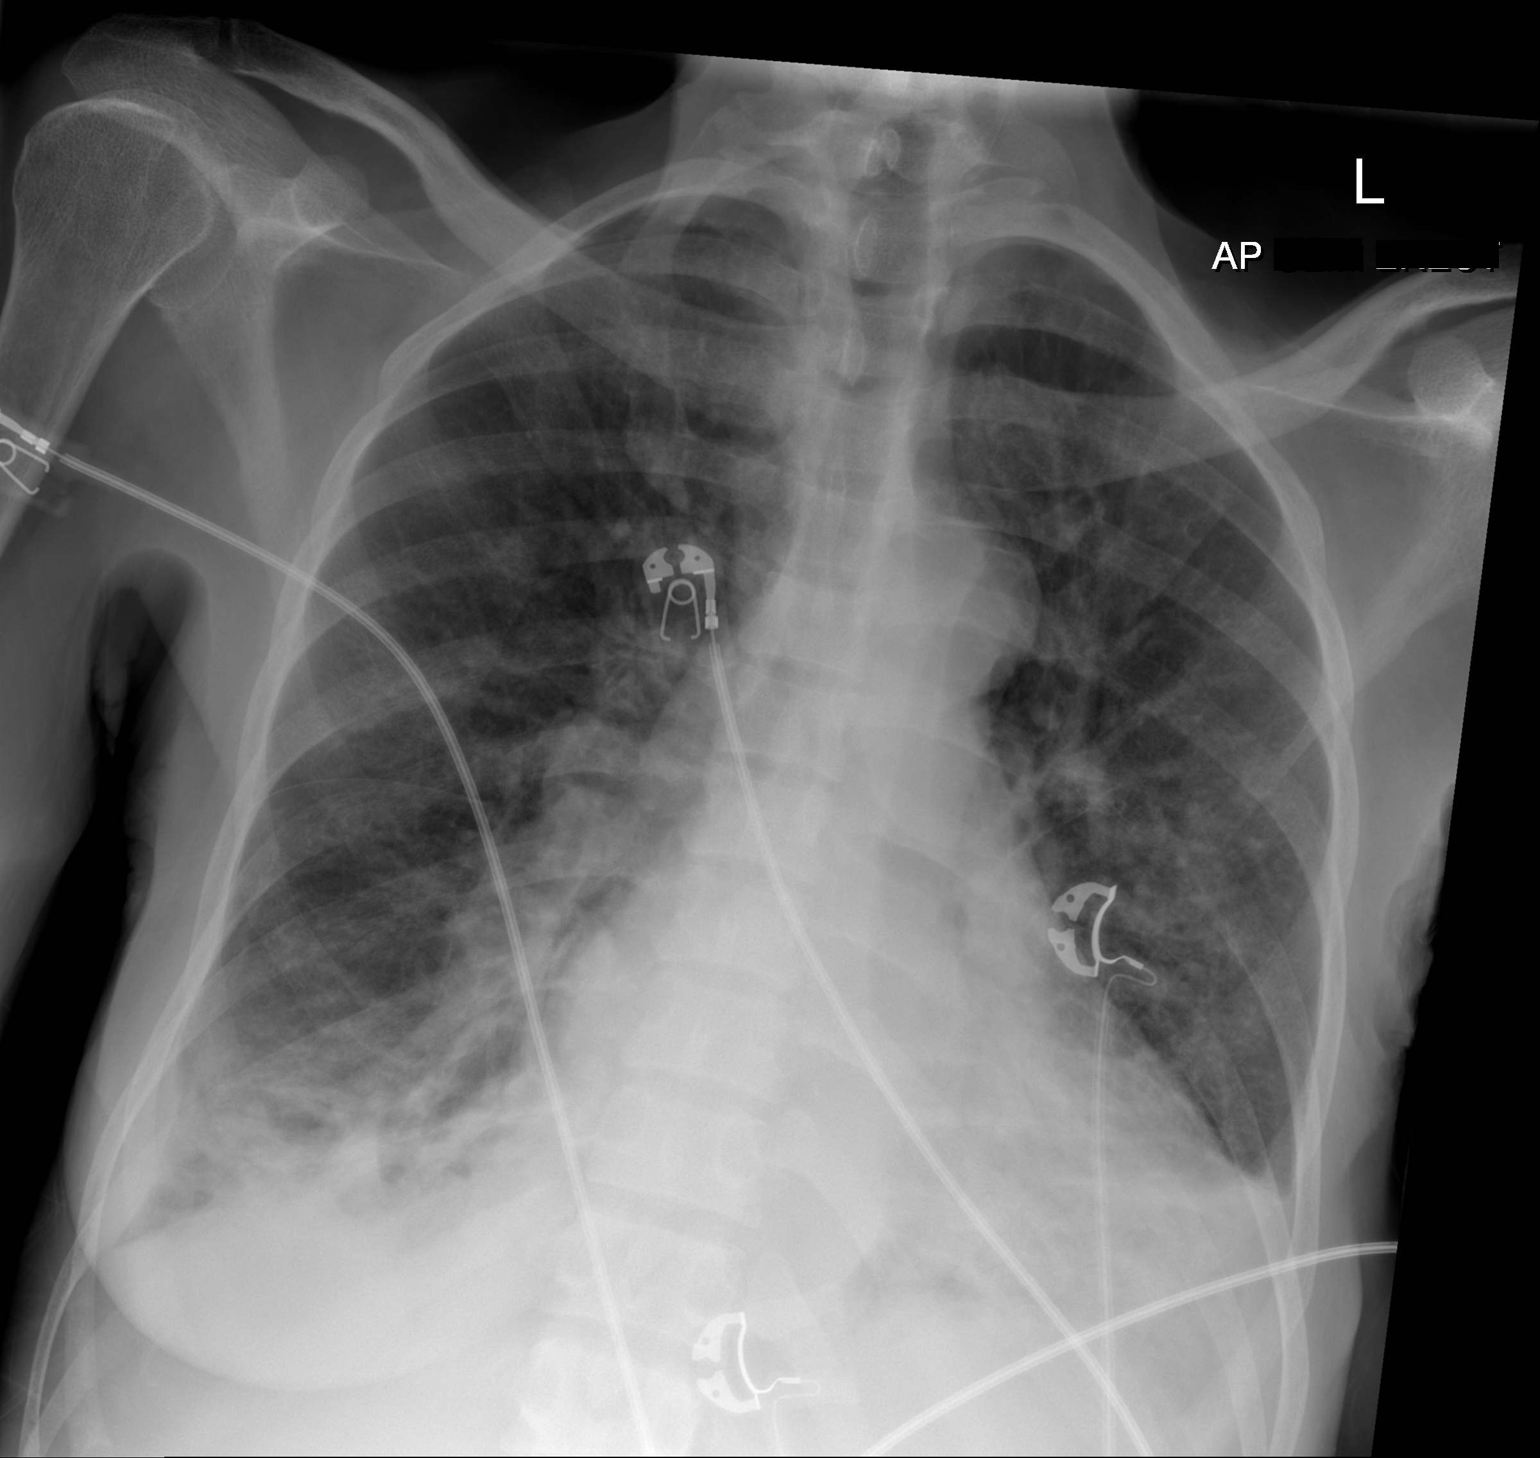

[2 of 2 positions shown; findings below may reference images not displayed]

FINDINGS: Right basilar airspace disease is worse than on the study earlier
today. Left basilar airspace disease appears unchanged. There small
bilateral pleural effusions. No pneumothorax. Cardiomegaly.
IMPRESSION: Right basilar airspace disease is worse on the study earlier today
could be due to increased atelectasis or pneumonia. Left basilar
airspace disease is unchanged.

Small bilateral pleural effusions are unchanged.

Cardiomegaly.

## 2023-06-10 DIAGNOSIS — N186 End stage renal disease: Secondary | ICD-10-CM | POA: Diagnosis not present

## 2023-06-10 DIAGNOSIS — N2581 Secondary hyperparathyroidism of renal origin: Secondary | ICD-10-CM | POA: Diagnosis not present

## 2023-06-10 DIAGNOSIS — Z992 Dependence on renal dialysis: Secondary | ICD-10-CM | POA: Diagnosis not present

## 2023-06-12 DIAGNOSIS — N2581 Secondary hyperparathyroidism of renal origin: Secondary | ICD-10-CM | POA: Diagnosis not present

## 2023-06-12 DIAGNOSIS — Z992 Dependence on renal dialysis: Secondary | ICD-10-CM | POA: Diagnosis not present

## 2023-06-12 DIAGNOSIS — N186 End stage renal disease: Secondary | ICD-10-CM | POA: Diagnosis not present

## 2023-06-13 DIAGNOSIS — I129 Hypertensive chronic kidney disease with stage 1 through stage 4 chronic kidney disease, or unspecified chronic kidney disease: Secondary | ICD-10-CM | POA: Diagnosis not present

## 2023-06-13 DIAGNOSIS — N186 End stage renal disease: Secondary | ICD-10-CM | POA: Diagnosis not present

## 2023-06-13 DIAGNOSIS — Z992 Dependence on renal dialysis: Secondary | ICD-10-CM | POA: Diagnosis not present
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
## 2023-08-04 ENCOUNTER — Ambulatory Visit (HOSPITAL_BASED_OUTPATIENT_CLINIC_OR_DEPARTMENT_OTHER): Payer: 59 | Admitting: Family

## 2023-08-14 DIAGNOSIS — N186 End stage renal disease: Secondary | ICD-10-CM | POA: Diagnosis not present

## 2023-08-14 DIAGNOSIS — Z992 Dependence on renal dialysis: Secondary | ICD-10-CM | POA: Diagnosis not present

## 2023-08-14 DIAGNOSIS — N2581 Secondary hyperparathyroidism of renal origin: Secondary | ICD-10-CM | POA: Diagnosis not present

## 2023-08-17 DIAGNOSIS — N186 End stage renal disease: Secondary | ICD-10-CM | POA: Diagnosis not present

## 2023-08-17 DIAGNOSIS — N2581 Secondary hyperparathyroidism of renal origin: Secondary | ICD-10-CM | POA: Diagnosis not present

## 2023-08-17 DIAGNOSIS — Z992 Dependence on renal dialysis: Secondary | ICD-10-CM | POA: Diagnosis not present

## 2023-08-19 DIAGNOSIS — N2581 Secondary hyperparathyroidism of renal origin: Secondary | ICD-10-CM | POA: Diagnosis not present

## 2023-08-19 DIAGNOSIS — N186 End stage renal disease: Secondary | ICD-10-CM | POA: Diagnosis not present

## 2023-08-19 DIAGNOSIS — Z992 Dependence on renal dialysis: Secondary | ICD-10-CM | POA: Diagnosis not present

## 2023-08-21 DIAGNOSIS — N2581 Secondary hyperparathyroidism of renal origin: Secondary | ICD-10-CM | POA: Diagnosis not present

## 2023-08-21 DIAGNOSIS — Z992 Dependence on renal dialysis: Secondary | ICD-10-CM | POA: Diagnosis not present

## 2023-08-21 DIAGNOSIS — N186 End stage renal disease: Secondary | ICD-10-CM | POA: Diagnosis not present

## 2023-08-24 DIAGNOSIS — Z992 Dependence on renal dialysis: Secondary | ICD-10-CM | POA: Diagnosis not present

## 2023-08-24 DIAGNOSIS — N186 End stage renal disease: Secondary | ICD-10-CM | POA: Diagnosis not present

## 2023-08-24 DIAGNOSIS — N2581 Secondary hyperparathyroidism of renal origin: Secondary | ICD-10-CM | POA: Diagnosis not present

## 2023-08-26 DIAGNOSIS — N186 End stage renal disease: Secondary | ICD-10-CM | POA: Diagnosis not present

## 2023-08-26 DIAGNOSIS — N2581 Secondary hyperparathyroidism of renal origin: Secondary | ICD-10-CM | POA: Diagnosis not present

## 2023-08-26 DIAGNOSIS — Z992 Dependence on renal dialysis: Secondary | ICD-10-CM | POA: Diagnosis not present

## 2023-08-27 DIAGNOSIS — R6889 Other general symptoms and signs: Secondary | ICD-10-CM | POA: Diagnosis not present

## 2023-08-28 DIAGNOSIS — Z992 Dependence on renal dialysis: Secondary | ICD-10-CM | POA: Diagnosis not present

## 2023-08-28 DIAGNOSIS — N186 End stage renal disease: Secondary | ICD-10-CM | POA: Diagnosis not present

## 2023-08-28 DIAGNOSIS — N2581 Secondary hyperparathyroidism of renal origin: Secondary | ICD-10-CM | POA: Diagnosis not present

## 2023-08-31 DIAGNOSIS — Z992 Dependence on renal dialysis: Secondary | ICD-10-CM | POA: Diagnosis not present

## 2023-08-31 DIAGNOSIS — N186 End stage renal disease: Secondary | ICD-10-CM | POA: Diagnosis not present

## 2023-08-31 DIAGNOSIS — N2581 Secondary hyperparathyroidism of renal origin: Secondary | ICD-10-CM | POA: Diagnosis not present

## 2023-09-02 DIAGNOSIS — N2581 Secondary hyperparathyroidism of renal origin: Secondary | ICD-10-CM | POA: Diagnosis not present

## 2023-09-02 DIAGNOSIS — Z992 Dependence on renal dialysis: Secondary | ICD-10-CM | POA: Diagnosis not present

## 2023-09-02 DIAGNOSIS — N186 End stage renal disease: Secondary | ICD-10-CM | POA: Diagnosis not present

## 2023-09-04 DIAGNOSIS — Z992 Dependence on renal dialysis: Secondary | ICD-10-CM | POA: Diagnosis not present

## 2023-09-04 DIAGNOSIS — N2581 Secondary hyperparathyroidism of renal origin: Secondary | ICD-10-CM | POA: Diagnosis not present

## 2023-09-04 DIAGNOSIS — N186 End stage renal disease: Secondary | ICD-10-CM | POA: Diagnosis not present

## 2023-09-07 DIAGNOSIS — N186 End stage renal disease: Secondary | ICD-10-CM | POA: Diagnosis not present

## 2023-09-07 DIAGNOSIS — N2581 Secondary hyperparathyroidism of renal origin: Secondary | ICD-10-CM | POA: Diagnosis not present

## 2023-09-07 DIAGNOSIS — Z992 Dependence on renal dialysis: Secondary | ICD-10-CM | POA: Diagnosis not present

## 2023-09-09 DIAGNOSIS — N2581 Secondary hyperparathyroidism of renal origin: Secondary | ICD-10-CM | POA: Diagnosis not present

## 2023-09-09 DIAGNOSIS — Z992 Dependence on renal dialysis: Secondary | ICD-10-CM | POA: Diagnosis not present

## 2023-09-09 DIAGNOSIS — N186 End stage renal disease: Secondary | ICD-10-CM | POA: Diagnosis not present

## 2023-09-11 DIAGNOSIS — N2581 Secondary hyperparathyroidism of renal origin: Secondary | ICD-10-CM | POA: Diagnosis not present

## 2023-09-11 DIAGNOSIS — Z992 Dependence on renal dialysis: Secondary | ICD-10-CM | POA: Diagnosis not present

## 2023-09-11 DIAGNOSIS — N186 End stage renal disease: Secondary | ICD-10-CM | POA: Diagnosis not present

## 2023-09-12 DIAGNOSIS — I129 Hypertensive chronic kidney disease with stage 1 through stage 4 chronic kidney disease, or unspecified chronic kidney disease: Secondary | ICD-10-CM | POA: Diagnosis not present

## 2023-09-12 DIAGNOSIS — Z992 Dependence on renal dialysis: Secondary | ICD-10-CM | POA: Diagnosis not present

## 2023-09-12 DIAGNOSIS — N186 End stage renal disease: Secondary | ICD-10-CM | POA: Diagnosis not present

## 2023-09-14 DIAGNOSIS — Z992 Dependence on renal dialysis: Secondary | ICD-10-CM | POA: Diagnosis not present

## 2023-09-14 DIAGNOSIS — N2581 Secondary hyperparathyroidism of renal origin: Secondary | ICD-10-CM | POA: Diagnosis not present

## 2023-09-14 DIAGNOSIS — N186 End stage renal disease: Secondary | ICD-10-CM | POA: Diagnosis not present

## 2023-09-16 DIAGNOSIS — Z992 Dependence on renal dialysis: Secondary | ICD-10-CM | POA: Diagnosis not present

## 2023-09-16 DIAGNOSIS — N2581 Secondary hyperparathyroidism of renal origin: Secondary | ICD-10-CM | POA: Diagnosis not present

## 2023-09-16 DIAGNOSIS — N186 End stage renal disease: Secondary | ICD-10-CM | POA: Diagnosis not present

## 2023-09-18 DIAGNOSIS — Z992 Dependence on renal dialysis: Secondary | ICD-10-CM | POA: Diagnosis not present

## 2023-09-18 DIAGNOSIS — N2581 Secondary hyperparathyroidism of renal origin: Secondary | ICD-10-CM | POA: Diagnosis not present

## 2023-09-18 DIAGNOSIS — N186 End stage renal disease: Secondary | ICD-10-CM | POA: Diagnosis not present

## 2023-09-21 DIAGNOSIS — N2581 Secondary hyperparathyroidism of renal origin: Secondary | ICD-10-CM | POA: Diagnosis not present

## 2023-09-21 DIAGNOSIS — N186 End stage renal disease: Secondary | ICD-10-CM | POA: Diagnosis not present

## 2023-09-21 DIAGNOSIS — Z992 Dependence on renal dialysis: Secondary | ICD-10-CM | POA: Diagnosis not present

## 2023-09-22 ENCOUNTER — Ambulatory Visit (INDEPENDENT_AMBULATORY_CARE_PROVIDER_SITE_OTHER): Payer: Medicare HMO | Admitting: Family

## 2023-09-22 ENCOUNTER — Encounter: Payer: Self-pay | Admitting: Family

## 2023-09-22 VITALS — BP 102/66 | HR 85 | Temp 98.3°F | Ht 67.5 in | Wt 138.8 lb

## 2023-09-22 DIAGNOSIS — Z1231 Encounter for screening mammogram for malignant neoplasm of breast: Secondary | ICD-10-CM

## 2023-09-22 DIAGNOSIS — Z130 Encounter for screening for diseases of the blood and blood-forming organs and certain disorders involving the immune mechanism: Secondary | ICD-10-CM | POA: Diagnosis not present

## 2023-09-22 DIAGNOSIS — Z13228 Encounter for screening for other metabolic disorders: Secondary | ICD-10-CM | POA: Diagnosis not present

## 2023-09-22 DIAGNOSIS — Z13220 Encounter for screening for lipoid disorders: Secondary | ICD-10-CM | POA: Diagnosis not present

## 2023-09-22 DIAGNOSIS — Z1329 Encounter for screening for other suspected endocrine disorder: Secondary | ICD-10-CM

## 2023-09-22 DIAGNOSIS — R87619 Unspecified abnormal cytological findings in specimens from cervix uteri: Secondary | ICD-10-CM

## 2023-09-22 DIAGNOSIS — Z0001 Encounter for general adult medical examination with abnormal findings: Secondary | ICD-10-CM

## 2023-09-22 DIAGNOSIS — Z131 Encounter for screening for diabetes mellitus: Secondary | ICD-10-CM

## 2023-09-22 DIAGNOSIS — Z0000 Encounter for general adult medical examination without abnormal findings: Secondary | ICD-10-CM

## 2023-09-22 NOTE — Progress Notes (Signed)
Patient asking for mammogram, Pap.   Wants shingles shot.

## 2023-09-22 NOTE — Progress Notes (Signed)
Patient ID: Cynthia Bray, female    DOB: 04/15/1961  MRN: [id number]  CC: Annual Exam  Subjective: Cynthia Bray is a 62 y.o. female who presents for annual exam. She is accompanied by her son.  Her concerns today include:  - Established with Cardiology.  - Established with Nephrology. - Established with ENT.  - Next colonoscopy due 12/24/2027.  - Patient reports since last office visit she did not follow-up with Gynecology regarding abnormal pap smear.   Patient Active Problem List   Diagnosis Date Noted   Syncope and collapse 09/23/2022   Hypotension, unspecified 09/23/2022   Hypotension 09/20/2022   Hyperparathyroidism (HCC) 04/03/2022   Angioedema due to angiotensin converting enzyme inhibitor (ACE-I) 02/20/2022   Hyperlipidemia with target LDL less than 70 02/20/2022   Secondary hyperparathyroidism (HCC) 02/20/2022   Pain, unspecified 01/08/2022   Encounter for immunization 07/11/2021   LV dysfunction 06/19/2021   Hypokalemia 06/03/2021   Iron deficiency anemia, unspecified 06/03/2021   Pulmonary edema 05/09/2021   Hypercalcemia 05/07/2021   HTN (hypertension) 05/07/2021   Tachycardia 05/07/2021   Mild protein-calorie malnutrition (HCC) 04/30/2021   Secondary hyperparathyroidism of renal origin (HCC) 04/26/2021   Respiratory failure with hypoxia (HCC) 04/25/2021   Heart failure, unspecified (HCC) 01/14/2021   Fluid overload 12/28/2020   Hypomagnesemia 10/31/2020   Acute on chronic systolic heart failure (HCC) 10/30/2020   Community acquired pneumonia of right upper lobe of lung 10/30/2020   Hypoxia 10/19/2020   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   Macrocytic anemia 01/05/2018   Hiatal hernia 12/24/2017   Precordial pain 12/15/2017   Hypertension 12/15/2017   ESRD (end stage renal disease) on dialysis (HCC) 12/08/2017   Coronary artery disease 12/08/2017   Dialysis patient (HCC) 12/08/2017   Dyslipidemia 12/08/2017   Gastroesophageal reflux disease without  esophagitis 12/02/2017   Primary cardiomyopathy (HCC) 07/02/2016   Chronic systolic dysfunction of left ventricle 07/02/2016   Heart murmur 07/02/2016     Current Outpatient Medications on File Prior to Visit  Medication Sig Dispense Refill   acetaminophen (TYLENOL) 500 MG tablet Take 1,000 mg by mouth daily as needed for mild pain, moderate pain, fever or headache.     aspirin EC 81 MG tablet Take 81 mg by mouth in the morning.     calcitRIOL (ROCALTROL) 0.5 MCG capsule Take 8 capsules (4 mcg total) by mouth daily. 30 capsule 6   Calcium Acetate 668 (169 Ca) MG TABS Take 2,004 mg by mouth with breakfast, with lunch, and with evening meal.     meclizine (ANTIVERT) 25 MG tablet Take 25 mg by mouth daily as needed for dizziness.     Methoxy PEG-Epoetin Beta (MIRCERA IJ) Mircera     multivitamin (RENA-VIT) TABS tablet Take 1 tablet by mouth at bedtime. (Patient taking differently: Take 1 tablet by mouth in the morning.) 30 tablet 0   VELPHORO 500 MG chewable tablet Chew 500 mg by mouth 3 (three) times daily with meals.     ondansetron (ZOFRAN-ODT) 4 MG disintegrating tablet Take 1 tablet (4 mg total) by mouth every 8 (eight) hours as needed for nausea or vomiting. (Patient not taking: Reported on 09/22/2023) 20 tablet 0   simvastatin (ZOCOR) 20 MG tablet Take 20 mg by mouth in the morning. (Patient not taking: Reported on 09/22/2023)     No current facility-administered medications on file prior to visit.    Allergies  Allergen Reactions   Sensipar [Cinacalcet] Nausea And Vomiting   Aplisol [Tuberculin, Ppd] Swelling  Lisinopril Swelling    Angioedema    Penicillin G Swelling    Social History   Socioeconomic History   Marital status: Widowed    Spouse name: Not on file   Number of children: Not on file   Years of education: Not on file   Highest education level: Not on file  Occupational History   Not on file  Tobacco Use   Smoking status: Never    Passive exposure: Never    Smokeless tobacco: Never  Vaping Use   Vaping status: Never Used  Substance and Sexual Activity   Alcohol use: Never   Drug use: Never   Sexual activity: Not on file  Other Topics Concern   Not on file  Social History Narrative   Not on file   Social Determinants of Health   Financial Resource Strain: Not on file  Food Insecurity: No Food Insecurity (09/20/2022)   Hunger Vital Sign    Worried About Running Out of Food in the Last Year: Never true    Ran Out of Food in the Last Year: Never true  Transportation Needs: No Transportation Needs (09/20/2022)   PRAPARE - Administrator, Civil Service (Medical): No    Lack of Transportation (Non-Medical): No  Physical Activity: Not on file  Stress: Not on file  Social Connections: Not on file  Intimate Partner Violence: Not At Risk (09/20/2022)   Humiliation, Afraid, Rape, and Kick questionnaire    Fear of Current or Ex-Partner: No    Emotionally Abused: No    Physically Abused: No    Sexually Abused: No    Family History  Problem Relation Age of Onset   Kidney disease Mother    Heart failure Sister    Heart attack Sister     Past Surgical History:  Procedure Laterality Date   AV FISTULA PLACEMENT     CARDIAC CATHETERIZATION     IR FLUORO GUIDE CV LINE RIGHT  04/10/2022   IR REMOVAL TUN CV CATH W/O FL  04/29/2022   IR US GUIDE VASC ACCESS RIGHT  04/10/2022   PARATHYROIDECTOMY N/A 04/03/2022   Procedure: NECK EXPLORATION, LEFT SUPERIOR PARATHYROIDECTOMY, LEFT INFERIOR PARATHYROIDECTOMY, RIGHT SUPERIOR PARATHYROIDECTOMY;  Surgeon: Darnell Level, MD;  Location: [facility] OR;  Service: General;  Laterality: N/A;   VASCULAR SURGERY     fistula bilaterally. left arm removed    ROS: Review of Systems Negative except as stated above  PHYSICAL EXAM: BP 102/66   Pulse 85   Temp 98.3 F (36.8 C) (Oral)   Ht 5' 7.5" (1.715 m)   Wt 138 lb 12.8 oz (63 kg)   SpO2 95%   BMI 21.42 kg/m   Physical Exam HENT:     Head:  Normocephalic and atraumatic.     Right Ear: Tympanic membrane, ear canal and external ear normal.     Left Ear: Tympanic membrane, ear canal and external ear normal.     Nose: Nose normal.     Mouth/Throat:     Mouth: Mucous membranes are moist.     Pharynx: Oropharynx is clear.  Eyes:     Extraocular Movements: Extraocular movements intact.     Conjunctiva/sclera: Conjunctivae normal.     Pupils: Pupils are equal, round, and reactive to light.  Neck:     Thyroid: No thyroid mass, thyromegaly or thyroid tenderness.  Cardiovascular:     Rate and Rhythm: Normal rate and regular rhythm.     Pulses: Normal pulses.  Heart sounds: Normal heart sounds.  Pulmonary:     Effort: Pulmonary effort is normal.     Breath sounds: Normal breath sounds.  Chest:     Comments: Patient declined.  Abdominal:     General: Bowel sounds are normal.     Palpations: Abdomen is soft.  Genitourinary:    Comments: Patient declined.  Musculoskeletal:        General: Normal range of motion.     Right shoulder: Normal.     Left shoulder: Normal.     Right upper arm: Normal.     Left upper arm: Normal.     Right elbow: Normal.     Left elbow: Normal.     Right forearm: Normal.     Left forearm: Normal.     Right wrist: Normal.     Left wrist: Normal.     Right hand: Normal.     Left hand: Normal.     Cervical back: Normal, normal range of motion and neck supple.     Thoracic back: Normal.     Lumbar back: Normal.     Right hip: Normal.     Left hip: Normal.     Right upper leg: Normal.     Left upper leg: Normal.     Right knee: Normal.     Left knee: Normal.     Right lower leg: Normal.     Left lower leg: Normal.     Right ankle: Normal.     Left ankle: Normal.     Right foot: Normal.     Left foot: Normal.  Skin:    General: Skin is warm and dry.     Capillary Refill: Capillary refill takes less than 2 seconds.  Neurological:     General: No focal deficit present.     Mental  Status: She is alert and oriented to person, place, and time.  Psychiatric:        Mood and Affect: Mood normal.        Behavior: Behavior normal.     ASSESSMENT AND PLAN: 1. Annual physical exam - Counseled on 150 minutes of exercise per week as tolerated, healthy eating (including decreased daily intake of saturated fats, cholesterol, added sugars, sodium), STI prevention, and routine healthcare maintenance.  2. Screening for metabolic disorder - Routine screening.  - Hepatic Function Panel  3. Screening for deficiency anemia - Routine screening.  - CBC  4. Diabetes mellitus screening - Routine screening.  - Hemoglobin A1c  5. Screening cholesterol level - Routine screening.  - Lipid panel  6. Thyroid disorder screen - Routine screening.  - TSH  7. Encounter for screening mammogram for malignant neoplasm of breast - Routine screening.  - MM Digital Screening; Future  8. Abnormal cervical Papanicolaou smear, unspecified abnormal pap finding - Referral to Gynecology for evaluation/management. - Ambulatory referral to Gynecology   Patient was given the opportunity to ask questions.  Patient verbalized understanding of the plan and was able to repeat key elements of the plan. Patient was given clear instructions to go to Emergency Department or return to medical center if symptoms don't improve, worsen, or new problems develop.The patient verbalized understanding.   Orders Placed This Encounter  Procedures   MM Digital Screening   TSH   CBC   Hepatic Function Panel   Hemoglobin A1c   Lipid panel   Ambulatory referral to Gynecology    Return in about 1 year (around 09/21/2024) for Physical  per patient preference.  Rema Fendt, NP

## 2023-09-23 DIAGNOSIS — Z992 Dependence on renal dialysis: Secondary | ICD-10-CM | POA: Diagnosis not present

## 2023-09-23 DIAGNOSIS — N2581 Secondary hyperparathyroidism of renal origin: Secondary | ICD-10-CM | POA: Diagnosis not present

## 2023-09-23 DIAGNOSIS — N186 End stage renal disease: Secondary | ICD-10-CM | POA: Diagnosis not present

## 2023-09-23 LAB — LIPID PANEL
Chol/HDL Ratio: 3.5 {ratio} (ref 0.0–4.4)
Cholesterol, Total: 226 mg/dL — ABNORMAL HIGH (ref 100–199)
HDL: 64 mg/dL (ref >39)
LDL Chol Calc (NIH): 138 mg/dL — ABNORMAL HIGH (ref 0–99)
Triglycerides: 136 mg/dL (ref 0–149)
VLDL Cholesterol Cal: 24 mg/dL (ref 5–40)

## 2023-09-23 LAB — CBC
Hematocrit: 33.3 % — ABNORMAL LOW (ref 34.0–46.6)
Hemoglobin: 10.9 g/dL — ABNORMAL LOW (ref 11.1–15.9)
MCH: 31 pg (ref 26.6–33.0)
MCHC: 32.7 g/dL (ref 31.5–35.7)
MCV: 95 fL (ref 79–97)
Platelets: 205 10*3/uL (ref 150–450)
RBC: 3.52 x10E6/uL — ABNORMAL LOW (ref 3.77–5.28)
RDW: 14.1 % (ref 11.7–15.4)
WBC: 4.8 10*3/uL (ref 3.4–10.8)

## 2023-09-23 LAB — HEMOGLOBIN A1C
Est. average glucose Bld gHb Est-mCnc: 108 mg/dL
Hgb A1c MFr Bld: 5.4 % (ref 4.8–5.6)

## 2023-09-23 LAB — TSH: TSH: 1.91 u[IU]/mL (ref 0.450–4.500)

## 2023-09-23 LAB — HEPATIC FUNCTION PANEL
ALT: 6 [IU]/L (ref 0–32)
AST: 9 [IU]/L (ref 0–40)
Albumin: 4.6 g/dL (ref 3.9–4.9)
Alkaline Phosphatase: 93 [IU]/L (ref 44–121)
Bilirubin Total: 0.9 mg/dL (ref 0.0–1.2)
Bilirubin, Direct: 0.31 mg/dL (ref 0.00–0.40)
Total Protein: 8 g/dL (ref 6.0–8.5)

## 2023-09-25 DIAGNOSIS — Z992 Dependence on renal dialysis: Secondary | ICD-10-CM | POA: Diagnosis not present

## 2023-09-25 DIAGNOSIS — N2581 Secondary hyperparathyroidism of renal origin: Secondary | ICD-10-CM | POA: Diagnosis not present

## 2023-09-25 DIAGNOSIS — N186 End stage renal disease: Secondary | ICD-10-CM | POA: Diagnosis not present

## 2023-09-28 DIAGNOSIS — Z992 Dependence on renal dialysis: Secondary | ICD-10-CM | POA: Diagnosis not present

## 2023-09-28 DIAGNOSIS — N186 End stage renal disease: Secondary | ICD-10-CM | POA: Diagnosis not present

## 2023-09-28 DIAGNOSIS — N2581 Secondary hyperparathyroidism of renal origin: Secondary | ICD-10-CM | POA: Diagnosis not present

## 2023-09-30 DIAGNOSIS — Z992 Dependence on renal dialysis: Secondary | ICD-10-CM | POA: Diagnosis not present

## 2023-09-30 DIAGNOSIS — N2581 Secondary hyperparathyroidism of renal origin: Secondary | ICD-10-CM | POA: Diagnosis not present

## 2023-09-30 DIAGNOSIS — N186 End stage renal disease: Secondary | ICD-10-CM | POA: Diagnosis not present

## 2023-10-02 DIAGNOSIS — N2581 Secondary hyperparathyroidism of renal origin: Secondary | ICD-10-CM | POA: Diagnosis not present

## 2023-10-02 DIAGNOSIS — Z992 Dependence on renal dialysis: Secondary | ICD-10-CM | POA: Diagnosis not present

## 2023-10-02 DIAGNOSIS — N186 End stage renal disease: Secondary | ICD-10-CM | POA: Diagnosis not present

## 2023-10-04 DIAGNOSIS — N2581 Secondary hyperparathyroidism of renal origin: Secondary | ICD-10-CM | POA: Diagnosis not present

## 2023-10-04 DIAGNOSIS — R6889 Other general symptoms and signs: Secondary | ICD-10-CM | POA: Diagnosis not present

## 2023-10-04 DIAGNOSIS — N186 End stage renal disease: Secondary | ICD-10-CM | POA: Diagnosis not present

## 2023-10-04 DIAGNOSIS — Z992 Dependence on renal dialysis: Secondary | ICD-10-CM | POA: Diagnosis not present

## 2023-10-06 DIAGNOSIS — Z992 Dependence on renal dialysis: Secondary | ICD-10-CM | POA: Diagnosis not present

## 2023-10-06 DIAGNOSIS — N186 End stage renal disease: Secondary | ICD-10-CM | POA: Diagnosis not present

## 2023-10-06 DIAGNOSIS — N2581 Secondary hyperparathyroidism of renal origin: Secondary | ICD-10-CM | POA: Diagnosis not present

## 2023-10-09 DIAGNOSIS — Z992 Dependence on renal dialysis: Secondary | ICD-10-CM | POA: Diagnosis not present

## 2023-10-09 DIAGNOSIS — N2581 Secondary hyperparathyroidism of renal origin: Secondary | ICD-10-CM | POA: Diagnosis not present

## 2023-10-09 DIAGNOSIS — N186 End stage renal disease: Secondary | ICD-10-CM | POA: Diagnosis not present

## 2023-10-11 DIAGNOSIS — R6889 Other general symptoms and signs: Secondary | ICD-10-CM | POA: Diagnosis not present

## 2023-10-11 DIAGNOSIS — Z992 Dependence on renal dialysis: Secondary | ICD-10-CM | POA: Diagnosis not present

## 2023-10-11 DIAGNOSIS — N186 End stage renal disease: Secondary | ICD-10-CM | POA: Diagnosis not present

## 2023-10-11 DIAGNOSIS — N2581 Secondary hyperparathyroidism of renal origin: Secondary | ICD-10-CM | POA: Diagnosis not present

## 2023-10-13 ENCOUNTER — Other Ambulatory Visit: Payer: Self-pay

## 2023-10-13 ENCOUNTER — Encounter (HOSPITAL_COMMUNITY): Payer: Self-pay

## 2023-10-13 ENCOUNTER — Emergency Department (HOSPITAL_COMMUNITY)
Admission: EM | Admit: 2023-10-13 | Discharge: 2023-10-13 | Disposition: A | Discharge status: Home / Self Care | Payer: Medicare HMO | Attending: Emergency Medicine | Admitting: Emergency Medicine

## 2023-10-13 DIAGNOSIS — R112 Nausea with vomiting, unspecified: Secondary | ICD-10-CM | POA: Diagnosis not present

## 2023-10-13 DIAGNOSIS — N186 End stage renal disease: Secondary | ICD-10-CM | POA: Diagnosis not present

## 2023-10-13 DIAGNOSIS — I120 Hypertensive chronic kidney disease with stage 5 chronic kidney disease or end stage renal disease: Secondary | ICD-10-CM | POA: Insufficient documentation

## 2023-10-13 DIAGNOSIS — Z992 Dependence on renal dialysis: Secondary | ICD-10-CM | POA: Diagnosis not present

## 2023-10-13 DIAGNOSIS — Z7982 Long term (current) use of aspirin: Secondary | ICD-10-CM | POA: Insufficient documentation

## 2023-10-13 DIAGNOSIS — U071 COVID-19: Secondary | ICD-10-CM | POA: Diagnosis not present

## 2023-10-13 DIAGNOSIS — R7989 Other specified abnormal findings of blood chemistry: Secondary | ICD-10-CM | POA: Insufficient documentation

## 2023-10-13 DIAGNOSIS — I129 Hypertensive chronic kidney disease with stage 1 through stage 4 chronic kidney disease, or unspecified chronic kidney disease: Secondary | ICD-10-CM | POA: Diagnosis not present

## 2023-10-13 LAB — I-STAT CHEM 8, ED
BUN: 18 mg/dL (ref 8–23)
Calcium, Ion: 0.95 mmol/L — ABNORMAL LOW (ref 1.15–1.40)
Chloride: 98 mmol/L (ref 98–111)
Creatinine, Ser: 11.9 mg/dL — ABNORMAL HIGH (ref 0.44–1.00)
Glucose, Bld: 98 mg/dL (ref 70–99)
HCT: 35 % — ABNORMAL LOW (ref 36.0–46.0)
Hemoglobin: 11.9 g/dL — ABNORMAL LOW (ref 12.0–15.0)
Potassium: 4 mmol/L (ref 3.5–5.1)
Sodium: 140 mmol/L (ref 135–145)
TCO2: 30 mmol/L (ref 22–32)

## 2023-10-13 LAB — RESP PANEL BY RT-PCR (RSV, FLU A&B, COVID)  RVPGX2
Influenza A by PCR: NEGATIVE
Influenza B by PCR: NEGATIVE
Resp Syncytial Virus by PCR: NEGATIVE
SARS Coronavirus 2 by RT PCR: POSITIVE — AB

## 2023-10-13 MED ORDER — MOLNUPIRAVIR EUA 200MG CAPSULE: 4.0000 | ORAL_CAPSULE | Freq: Two times a day (BID) | ORAL | 0 refills | Status: AC

## 2023-10-13 MED ORDER — ACETAMINOPHEN 500 MG PO TABS: 1000.0000 mg | ORAL_TABLET | Freq: Once | ORAL | Status: DC

## 2023-10-13 MED ORDER — ONDANSETRON 4 MG PO TBDP
4.0000 mg | ORAL_TABLET | Freq: Once | ORAL | Status: AC
  Administered 2023-10-13: 4 mg via ORAL
  Filled 2023-10-13: qty 1

## 2023-10-13 MED ORDER — ONDANSETRON HCL 4 MG PO TABS: 4.0000 mg | ORAL_TABLET | Freq: Four times a day (QID) | ORAL | 0 refills | Status: DC

## 2023-10-13 MED ORDER — ACETAMINOPHEN 500 MG PO TABS
1000.0000 mg | ORAL_TABLET | Freq: Once | ORAL | Status: AC
  Administered 2023-10-13: 1000 mg via ORAL
  Filled 2023-10-13: qty 2

## 2023-10-13 NOTE — ED Triage Notes (Signed)
Pt c/o headache, bodyaches, and vomiting since last night.

## 2023-10-13 NOTE — ED Provider Triage Note (Signed)
 Emergency Medicine Provider Triage Evaluation Note  Cynthia Bray , a 62 y.o. female  was evaluated in triage.  Pt complains of bodyaches, chills, cough, nausea and vomiting that started last night.  Denies any shortness of breath.  Review of Systems  Positive: As above Negative: As above  Physical Exam  BP 111/67 (BP Location: Left Arm)   Pulse (!) 111   Temp 100.2 F (37.9 C)   Resp 17   Ht 5' 8 (1.727 m)   Wt 61.2 kg   SpO2 98%   BMI 20.53 kg/m  Gen:   Awake, no distress   Resp:  Normal effort  MSK:   Moves extremities without difficulty  Other:  Lungs clear to auscultation bilaterally  Medical Decision Making  Medically screening exam initiated at 10:11 AM.  Appropriate orders placed.  Debbie Sharps was informed that the remainder of the evaluation will be completed by another provider, this initial triage assessment does not replace that evaluation, and the importance of remaining in the ED until their evaluation is complete.  Patient given Tylenol  for fever in triage   Veta Palma, PA-C 10/13/23 1011

## 2023-10-13 NOTE — Discharge Instructions (Addendum)
 You were seen in the ER for evaluation of your nausea and cold symptoms.  I am glad that you are feeling better.  For your fever you can take Tylenol  at 1000 mg every 6 hours.  I am sending you home with 2 medications.  A medication for nausea called Zofran  that you can take as needed.  It is a medication called molnupiravir  to take for your COVID symptoms.  On your dialysis days, you will take your morning dose after dialysis.  Please make sure you are staying well-hydrated.  Make sure that you are staying quarantine isolating to avoid the spread.  Please make sure you call your dialysis center to make up for your missed dialysis appointment.  Make sure you follow with your primary care doctor.  If any concerns, new or worsening symptoms, please return to your nearest emerged department for evaluation.  Get help right away if: You have trouble breathing or get short of breath. You have pain or pressure in your chest. You cannot speak or move any part of your body. You are confused. Your symptoms get worse. These symptoms may be an emergency. Get help right away. Call 911. Do not wait to see if the symptoms will go away. Do not drive yourself to the hospital.

## 2023-10-13 NOTE — ED Provider Notes (Signed)
 Portal EMERGENCY DEPARTMENT AT [facility] Provider Note   CSN: [id number] Arrival date & time: 10/13/23  9076     History Chief Complaint  Patient presents with   Emesis    Cynthia Bray is a 62 y.o. female with history of ESRD MWF, hypertension presents to the emergency department today for evaluation of fatigue with runny nose and nasal congestion starting yesterday.  Patient reports that she has had 2 episodes of vomiting over the past 2 days.  She denies any abdominal pain.  Reports some nausea but has been generally improved after she received Zofran  in triage.  She denies any chest pain or shortness of breath.  She reports an occasional cough.  Reports fatigue and chills.  She has not taken a temperature at home.  Has not tried any medications for symptoms prior to arrival.  She missed dialysis today because she was not feeling well.   Emesis Associated symptoms: chills and cough   Associated symptoms: no abdominal pain, no diarrhea, no fever, no headaches and no sore throat        Home Medications Prior to Admission medications   Medication Sig Start Date End Date Taking? Authorizing Provider  acetaminophen  (TYLENOL ) 500 MG tablet Take 1,000 mg by mouth daily as needed for mild pain, moderate pain, fever or headache.    [provider]  aspirin  EC 81 MG tablet Take 81 mg by mouth in the morning.    [provider]  calcitRIOL  (ROCALTROL ) 0.5 MCG capsule Take 8 capsules (4 mcg total) by mouth daily. 04/16/22   Stovall, Kathryn R, PA-C  Calcium  Acetate 668 (169 Ca) MG TABS Take 2,004 mg by mouth with breakfast, with lunch, and with evening meal.    [provider]  meclizine (ANTIVERT) 25 MG tablet Take 25 mg by mouth daily as needed for dizziness. 07/30/22   [provider]  multivitamin (RENA-VIT) TABS tablet Take 1 tablet by mouth at bedtime. Patient taking differently: Take 1 tablet by mouth in the morning. 05/10/21   Cheryle Page, MD  ondansetron  (ZOFRAN -ODT) 4 MG disintegrating tablet Take 1 tablet (4 mg total) by mouth every 8 (eight) hours as needed for nausea or vomiting. Patient not taking: Reported on 09/22/2023 02/23/23   Franklyn Sid SAILOR, MD  simvastatin  (ZOCOR ) 20 MG tablet Take 20 mg by mouth in the morning. Patient not taking: Reported on 09/22/2023    [provider]  VELPHORO 500 MG chewable tablet Chew 500 mg by mouth 3 (three) times daily with meals. 07/14/22   [provider]      Allergies    Sensipar  [cinacalcet ]; Aplisol [tuberculin, ppd]; Lisinopril; and Penicillin g    Review of Systems   Review of Systems  Constitutional:  Positive for chills and fatigue. Negative for fever.  HENT:  Positive for congestion and rhinorrhea. Negative for sore throat.   Respiratory:  Positive for cough. Negative for shortness of breath.   Cardiovascular:  Negative for chest pain.  Gastrointestinal:  Positive for nausea and vomiting. Negative for abdominal pain, blood in stool, constipation and diarrhea.  Musculoskeletal:  Negative for neck pain and neck stiffness.  Neurological:  Negative for headaches.    Physical Exam Updated Vital Signs BP 123/62 (BP Location: Left Arm)   Pulse (!) 109   Temp 99.6 F (37.6 C) (Oral)   Resp 18   Ht 5' 8 (1.727 m)   Wt 61.2 kg   SpO2 97%   BMI 20.53  kg/m  Physical Exam Vitals and nursing note reviewed.  Constitutional:      General: She is not in acute distress.    Appearance: She is not ill-appearing or toxic-appearing.  HENT:     Right Ear: Tympanic membrane, ear canal and external ear normal.     Left Ear: Tympanic membrane, ear canal and external ear normal.     Nose: Congestion present.     Mouth/Throat:     Mouth: Mucous membranes are moist.     Pharynx: No oropharyngeal exudate or posterior oropharyngeal erythema.     Comments: No pharyngeal erythema, edema, or exudate noted.  Moist mucous membranes.  Uvula midline.  Airway  patent. Eyes:     General: No scleral icterus. Pulmonary:     Effort: Pulmonary effort is normal. No respiratory distress.     Breath sounds: Normal breath sounds.  Abdominal:     Palpations: Abdomen is soft.     Tenderness: There is no abdominal tenderness. There is no guarding or rebound.  Musculoskeletal:     Right lower leg: No edema.     Left lower leg: No edema.  Skin:    General: Skin is warm and dry.  Neurological:     Mental Status: She is alert.     ED Results / Procedures / Treatments   Labs (all labs ordered are listed, but only abnormal results are displayed) Labs Reviewed  RESP PANEL BY RT-PCR (RSV, FLU A&B, COVID)  RVPGX2 - Abnormal; Notable for the following components:      Result Value   SARS Coronavirus 2 by RT PCR POSITIVE (*)    All other components within normal limits  I-STAT CHEM 8, ED - Abnormal; Notable for the following components:   Creatinine, Ser 11.90 (*)    Calcium , Ion 0.95 (*)    Hemoglobin 11.9 (*)    HCT 35.0 (*)    All other components within normal limits    EKG None  Radiology No results found.  Procedures Procedures   Medications Ordered in ED Medications  acetaminophen  (TYLENOL ) tablet 1,000 mg (has no administration in time range)  acetaminophen  (TYLENOL ) tablet 1,000 mg (1,000 mg Oral Given 10/13/23 1020)  ondansetron  (ZOFRAN -ODT) disintegrating tablet 4 mg (4 mg Oral Given 10/13/23 1020)    ED Course/ Medical Decision Making/ A&P Clinical Course as of 10/13/23 1851  Tue Oct 13, 2023  1820 Temp 99.47F HR 100.  [RR]    Clinical Course User Index [RR] Bernis Ernst, PA-C   Medical Decision Making Risk OTC drugs. Prescription drug management.   62 y.o. female presents to the ER for evaluation of nausea and cough and cold symptoms. Differential diagnosis includes but is not limited to viral illness, nausea, GI illness, electrolyte abnormality. Vital signs show borderline tachycardia and borderline fever at 100.75F.  Physical exam as noted above.   I independently reviewed and interpreted the patient's labs.  COVID-positive.  Given the patient's having some mild tachycardia however likely it is from her fever, will order i-STAT to check her potassium given that she missed her session today.  I-STAT Chem-8 shows creatinine elevated 11.9.  Her potassium is at 4.0.  She denies any abdominal pain.  Her abdomen is soft and nontender to palpation.  Do not think any CT imaging is needed at this time.  She denies any chest pain or shortness of breath.  She speaking in full sentences and satting well on room air without increased work of breathing.  It  was more the nasal congestion and rhinorrhea with the vomiting that she was concerned of.  She reports he is only coughed a few times.  Patient was given Tylenol  for fever and Zofran  for nausea in the front.  She is tolerated p.o. without issue.  Will send her home with some Zofran .  Discussed with pharmacy, will give her some molnupiravir .  Discussed with patient to take her morning dose after dialysis.  I will send her home with a few Zofran  pills.  Discussed that she will need to follow-up with dialysis to make up for missed session today.  She does not appear volume overloaded.  She denies any shortness of breath or any chest pain.  She is stable for discharge home with close outpatient follow-up  We discussed the results of the labs/imaging. The plan is follow-up with dialysis, take medications as prescribed, follow-up PCP. We discussed strict return precautions and red flag symptoms. The patient verbalized their understanding and agrees to the plan. The patient is stable and being discharged home in good condition.  Portions of this report may have been transcribed using voice recognition software. Every effort was made to ensure accuracy; however, inadvertent computerized transcription errors may be present.   I discussed this case with my attending physician who  cosigned this note including patient's presenting symptoms, physical exam, and planned diagnostics and interventions. Attending physician stated agreement with plan or made changes to plan which were implemented.   Final Clinical Impression(s) / ED Diagnoses Final diagnoses:  COVID  Nausea and vomiting, unspecified vomiting type    Rx / DC Orders ED Discharge Orders          Ordered    ondansetron  (ZOFRAN ) 4 MG tablet  Every 6 hours        10/13/23 1901    molnupiravir  EUA (LAGEVRIO ) 200 mg CAPS capsule  2 times daily        10/13/23 1901              Bernis Ernst, DEVONNA 10/15/23 0035    Dean Clarity, MD 10/17/23 [phone]

## 2023-10-17 DIAGNOSIS — N186 End stage renal disease: Secondary | ICD-10-CM | POA: Diagnosis not present

## 2023-10-17 DIAGNOSIS — Z992 Dependence on renal dialysis: Secondary | ICD-10-CM | POA: Diagnosis not present

## 2023-10-17 DIAGNOSIS — N2581 Secondary hyperparathyroidism of renal origin: Secondary | ICD-10-CM | POA: Diagnosis not present

## 2023-10-19 DIAGNOSIS — N2581 Secondary hyperparathyroidism of renal origin: Secondary | ICD-10-CM | POA: Diagnosis not present

## 2023-10-19 DIAGNOSIS — Z992 Dependence on renal dialysis: Secondary | ICD-10-CM | POA: Diagnosis not present

## 2023-10-19 DIAGNOSIS — N186 End stage renal disease: Secondary | ICD-10-CM | POA: Diagnosis not present

## 2023-10-20 ENCOUNTER — Ambulatory Visit: Payer: Medicare HMO

## 2023-10-21 DIAGNOSIS — N2581 Secondary hyperparathyroidism of renal origin: Secondary | ICD-10-CM | POA: Diagnosis not present

## 2023-10-21 DIAGNOSIS — Z992 Dependence on renal dialysis: Secondary | ICD-10-CM | POA: Diagnosis not present

## 2023-10-21 DIAGNOSIS — N186 End stage renal disease: Secondary | ICD-10-CM | POA: Diagnosis not present

## 2023-10-23 DIAGNOSIS — N2581 Secondary hyperparathyroidism of renal origin: Secondary | ICD-10-CM | POA: Diagnosis not present

## 2023-10-23 DIAGNOSIS — N186 End stage renal disease: Secondary | ICD-10-CM | POA: Diagnosis not present

## 2023-10-23 DIAGNOSIS — Z992 Dependence on renal dialysis: Secondary | ICD-10-CM | POA: Diagnosis not present

## 2023-10-26 DIAGNOSIS — N2581 Secondary hyperparathyroidism of renal origin: Secondary | ICD-10-CM | POA: Diagnosis not present

## 2023-10-26 DIAGNOSIS — Z992 Dependence on renal dialysis: Secondary | ICD-10-CM | POA: Diagnosis not present

## 2023-10-26 DIAGNOSIS — N186 End stage renal disease: Secondary | ICD-10-CM | POA: Diagnosis not present

## 2023-10-27 ENCOUNTER — Ambulatory Visit (HOSPITAL_BASED_OUTPATIENT_CLINIC_OR_DEPARTMENT_OTHER): Payer: Medicare HMO | Admitting: Family

## 2023-10-27 ENCOUNTER — Encounter (HOSPITAL_BASED_OUTPATIENT_CLINIC_OR_DEPARTMENT_OTHER): Payer: Self-pay | Admitting: Family

## 2023-10-27 VITALS — BP 110/68 | HR 92 | Ht 68.0 in | Wt 139.8 lb

## 2023-10-27 DIAGNOSIS — I5022 Chronic systolic (congestive) heart failure: Secondary | ICD-10-CM | POA: Diagnosis not present

## 2023-10-27 DIAGNOSIS — E785 Hyperlipidemia, unspecified: Secondary | ICD-10-CM

## 2023-10-27 DIAGNOSIS — I700 Atherosclerosis of aorta: Secondary | ICD-10-CM

## 2023-10-27 DIAGNOSIS — R052 Subacute cough: Secondary | ICD-10-CM

## 2023-10-27 DIAGNOSIS — N186 End stage renal disease: Secondary | ICD-10-CM

## 2023-10-27 MED ORDER — ROSUVASTATIN CALCIUM 10 MG PO TABS: 10.0000 mg | ORAL_TABLET | Freq: Every day | ORAL | 3 refills | Status: DC

## 2023-10-27 MED ORDER — BENZONATATE 100 MG PO CAPS: 100.0000 mg | ORAL_CAPSULE | Freq: Three times a day (TID) | ORAL | 0 refills | Status: DC | PRN

## 2023-10-27 NOTE — Patient Instructions (Signed)
 Medication Instructions:   STOP Simvastatin   START Rosuvastatin  10mg  daily  START Tessalon  100-200mg  (1-2 tablets) three times per day as needed for cough.   *If you need a refill on your cardiac medications before your next appointment, please call your pharmacy*   Lab Work: Your physician recommends that you return for lab work in 3 months for fasting lipid panel, LFT  If you have labs (blood work) drawn today and your tests are completely normal, you will receive your results only by: MyChart Message (if you have MyChart) OR A paper copy in the mail If you have any lab test that is abnormal or we need to change your treatment, we will call you to review the results.   Follow-Up: At [facility], you and your health needs are our priority.  As part of our continuing mission to provide you with exceptional heart care, we have created designated Provider Care Teams.  These Care Teams include your primary Cardiologist (physician) and Advanced Practice Providers (APPs -  Physician Assistants and Nurse Practitioners) who all work together to provide you with the care you need, when you need it.  We recommend signing up for the patient portal called MyChart.  Sign up information is provided on this After Visit Summary.  MyChart is used to connect with patients for Virtual Visits (Telemedicine).  Patients are able to view lab/test results, encounter notes, upcoming appointments, etc.  Non-urgent messages can be sent to your provider as well.   To learn more about what you can do with MyChart, go to [email or web address].    Your next appointment:   May or June 2025  Provider:   Annabella Scarce, MD    Other Instructions

## 2023-10-27 NOTE — Progress Notes (Signed)
 Cardiology Office Note:  .   Date:  10/27/2023  ID:  Cynthia Bray, DOB 1961/04/01, MRN [id number] PCP: Lorren Greig PARAS, NP   HeartCare Providers Cardiologist:  None    History of Present Illness: .   Cynthia Bray is a 63 y.o. female with history of chronic combined systolic and diastolic heart failure, GERD, hypothyroidism, MR, ESRD (HD M, W, F), hyperlipidemia.  Initially hospitalized July 2022 for CHF exacerbation.  Prior stress test 12/2020 small mild reversible defect in anterior septal wall consistent with ischemia and subsequent LHC with no significant CAD with elevated LVEDP.  Admitted 03/2021 with staph epi bacteremia echo LVEF 20-25%.  Started on GDMT and treated with antibiotics.  ED visit 09/2022 with syncope after standing up after HD.  Treated with IVF and Levophed .  Syncopal episode felt to be secondary hypovolemia as orthostatic vital signs were negative.  Echo LVEF 60%, grade 1 diastolic dysfunction.  Last seen 10/21/2022.  Volume well-managed by HD.  No recurrent syncope or presyncope.  7-day ZIO ordered revealing predominantly normal sinus rhythm with no significant arrhythmias.  Presents today for follow-up independently.  ED visit 10/13/23 with COVID. Still with cough post COVID which is non productive and runny rose.  Notes prior ache with Atorvastatin . Reviewed LDL not at goal <70.   ROS: Please see the history of present illness.    All other systems reviewed and are negative.    Studies Reviewed: .        Cardiac Studies & Procedures      ECHOCARDIOGRAM  ECHOCARDIOGRAM COMPLETE 09/20/2022  Narrative ECHOCARDIOGRAM REPORT    Patient Name:   Cynthia Bray Date of Exam: 09/20/2022 Medical Rec #:  [id number]     Height:       68.0 in Accession #:    [id number]    Weight:       120.0 lb Date of Birth:  1961-10-07    BSA:          1.645 m Patient Age:    63 years      BP:           96/64 mmHg Patient Gender: F             HR:           86 bpm. Exam  Location:  Inpatient  Procedure: 2D Echo, Cardiac Doppler and Color Doppler  Indications:    Syncope  History:        Patient has prior history of Echocardiogram examinations, most recent 05/08/2021. CHF and Cardiomyopathy, CAD, Arrythmias:Tachycardia, Signs/Symptoms:Syncope; Risk Factors:Hypertension and Dyslipidemia. ESRD on Dialysis.  Sonographer:    Naomie Reef Referring Phys: [phone] PAULA B SIMPSON  IMPRESSIONS   1. Left ventricular ejection fraction, by estimation, is 60 to 65%. The left ventricle has normal function. The left ventricle has no regional wall motion abnormalities. Left ventricular diastolic parameters are consistent with Grade I diastolic dysfunction (impaired relaxation). 2. Right ventricular systolic function is normal. The right ventricular size is normal. There is moderately elevated pulmonary artery systolic pressure. 3. The mitral valve is degenerative. Trivial mitral valve regurgitation. 4. The aortic valve is tricuspid. There is mild thickening of the aortic valve. Aortic valve regurgitation is trivial. Aortic valve sclerosis is present, with no evidence of aortic valve stenosis. 5. The inferior vena cava is normal in size with greater than 50% respiratory variability, suggesting right atrial pressure of 3 mmHg.  Comparison(s): Prior images reviewed side by side. Compared to prior TTE  on 04/2021, the LVEF has improved significantly from 25-30% to 60-65%.  FINDINGS Left Ventricle: Left ventricular ejection fraction, by estimation, is 60 to 65%. The left ventricle has normal function. The left ventricle has no regional wall motion abnormalities. The left ventricular internal cavity size was normal in size. There is no left ventricular hypertrophy. Left ventricular diastolic parameters are consistent with Grade I diastolic dysfunction (impaired relaxation).  Right Ventricle: The right ventricular size is normal. No increase in right ventricular wall  thickness. Right ventricular systolic function is normal. There is moderately elevated pulmonary artery systolic pressure. The tricuspid regurgitant velocity is 3.26 m/s, and with an assumed right atrial pressure of 8 mmHg, the estimated right ventricular systolic pressure is 50.5 mmHg.  Left Atrium: Left atrial size was normal in size.  Right Atrium: Right atrial size was normal in size.  Pericardium: There is no evidence of pericardial effusion.  Mitral Valve: The mitral valve is degenerative in appearance. There is mild thickening of the mitral valve leaflet(s). There is mild calcification of the mitral valve leaflet(s). Mild mitral annular calcification. Trivial mitral valve regurgitation. MV peak gradient, 5.8 mmHg. The mean mitral valve gradient is 2.0 mmHg.  Tricuspid Valve: The tricuspid valve is normal in structure. Tricuspid valve regurgitation is mild.  Aortic Valve: The aortic valve is tricuspid. There is mild thickening of the aortic valve. Aortic valve regurgitation is trivial. Aortic valve sclerosis is present, with no evidence of aortic valve stenosis. Aortic valve mean gradient measures 7.0 mmHg. Aortic valve peak gradient measures 11.7 mmHg. Aortic valve area, by VTI measures 1.99 cm.  Pulmonic Valve: The pulmonic valve was normal in structure. Pulmonic valve regurgitation is trivial.  Aorta: The aortic root is normal in size and structure.  Venous: The inferior vena cava is normal in size with greater than 50% respiratory variability, suggesting right atrial pressure of 3 mmHg.  IAS/Shunts: The interatrial septum is aneurysmal. The atrial septum is grossly normal.   LEFT VENTRICLE PLAX 2D LVIDd:         4.20 cm   Diastology LVIDs:         2.90 cm   LV e' medial:    5.98 cm/s LV PW:         1.00 cm   LV E/e' medial:  15.9 LV IVS:        0.90 cm   LV e' lateral:   7.72 cm/s LVOT diam:     1.90 cm   LV E/e' lateral: 12.3 LV SV:         70 LV SV Index:   43 LVOT  Area:     2.84 cm   RIGHT VENTRICLE RV Basal diam:  3.50 cm RV Mid diam:    2.40 cm RV S prime:     14.70 cm/s TAPSE (M-mode): 2.2 cm  LEFT ATRIUM             Index        RIGHT ATRIUM           Index LA diam:        3.20 cm 1.95 cm/m   RA Area:     15.10 cm LA Vol (A2C):   45.7 ml 27.78 ml/m  RA Volume:   41.90 ml  25.47 ml/m LA Vol (A4C):   31.8 ml 19.33 ml/m LA Biplane Vol: 40.4 ml 24.56 ml/m AORTIC VALVE  PULMONIC VALVE AV Area (Vmax):    2.01 cm      PV Vmax:       1.14 m/s AV Area (Vmean):   1.74 cm      PV Peak grad:  5.2 mmHg AV Area (VTI):     1.99 cm AV Vmax:           171.00 cm/s AV Vmean:          124.000 cm/s AV VTI:            0.353 m AV Peak Grad:      11.7 mmHg AV Mean Grad:      7.0 mmHg LVOT Vmax:         121.00 cm/s LVOT Vmean:        76.100 cm/s LVOT VTI:          0.248 m LVOT/AV VTI ratio: 0.70  AORTA Ao Root diam: 3.20 cm  MITRAL VALVE               TRICUSPID VALVE MV Area (PHT): 3.20 cm    TR Peak grad:   42.5 mmHg MV Area VTI:   2.36 cm    TR Vmax:        326.00 cm/s MV Peak grad:  5.8 mmHg MV Mean grad:  2.0 mmHg    SHUNTS MV Vmax:       1.20 m/s    Systemic VTI:  0.25 m MV Vmean:      69.2 cm/s   Systemic Diam: 1.90 cm MV Decel Time: 237 msec MV E velocity: 95.10 cm/s MV A velocity: 99.40 cm/s MV E/A ratio:  0.96  Powell Sorrow MD Electronically signed by Powell Sorrow MD Signature Date/Time: 09/20/2022/11:28:23 AM    Final   MONITORS  LONG TERM MONITOR (3-14 DAYS) 11/10/2022  Narrative 7 Day Zio Monitor  Quality: Fair.  Baseline artifact. Predominant rhythm: sinus rhythm Average heart rate: 99 bpm Max heart rate: 154 bpm Min heart rate: 75 bpm Pauses >2.5 seconds: none Rare (<1%) PACs and PVCs   Tiffany C. Raford, MD, [facility] 11/30/2022 2:30 PM           Risk Assessment/Calculations:             Physical Exam:   VS:  BP 110/68 (BP Location: Left Arm, Patient Position: Sitting,  Cuff Size: Normal)   Pulse 92   Ht 5' 8 (1.727 m)   Wt 139 lb 12.8 oz (63.4 kg)   SpO2 96%   BMI 21.26 kg/m    Wt Readings from Last 3 Encounters:  10/27/23 139 lb 12.8 oz (63.4 kg)  10/13/23 135 lb (61.2 kg)  09/22/23 138 lb 12.8 oz (63 kg)    GEN: Well nourished, well developed in no acute distress NECK: No JVD; No carotid bruits CARDIAC: RRR, no murmurs, rubs, gallops RESPIRATORY:  Clear to auscultation without rales, wheezing or rhonchi  ABDOMEN: Soft, non-tender, non-distended EXTREMITIES:  No edema; No deformity   ASSESSMENT AND PLAN: .    Post COVID cough - Rx Tessalon  for symptom relief . HLD, LDL goal <70 - LDL not at goal. Previously did not tolerate Atorvastatin . Rx Rosuvastatin  10mg  and FLP/LFT in 3 months HTN - BP well controlled. Not requiring antihypertensive agent at this time.  NICM - Euvolemic and well compensated on exam. Volume per HD. Continue Metoprolol , Imdur . No ARB due to allergy.        Dispo: follow up May or June 2025  Signed,  Lindora Alviar S Zetha Kuhar, NP

## 2023-10-28 DIAGNOSIS — Z992 Dependence on renal dialysis: Secondary | ICD-10-CM | POA: Diagnosis not present

## 2023-10-28 DIAGNOSIS — N186 End stage renal disease: Secondary | ICD-10-CM | POA: Diagnosis not present

## 2023-10-28 DIAGNOSIS — N2581 Secondary hyperparathyroidism of renal origin: Secondary | ICD-10-CM | POA: Diagnosis not present

## 2023-10-29 ENCOUNTER — Ambulatory Visit
Admission: RE | Admit: 2023-10-29 | Discharge: 2023-10-29 | Disposition: A | Discharge status: Home / Self Care | Payer: Medicare HMO | Source: Ambulatory Visit | Attending: Family | Admitting: Family

## 2023-10-29 DIAGNOSIS — Z1231 Encounter for screening mammogram for malignant neoplasm of breast: Secondary | ICD-10-CM

## 2023-10-30 DIAGNOSIS — Z992 Dependence on renal dialysis: Secondary | ICD-10-CM | POA: Diagnosis not present

## 2023-10-30 DIAGNOSIS — N186 End stage renal disease: Secondary | ICD-10-CM | POA: Diagnosis not present

## 2023-10-30 DIAGNOSIS — N2581 Secondary hyperparathyroidism of renal origin: Secondary | ICD-10-CM | POA: Diagnosis not present

## 2023-11-02 ENCOUNTER — Encounter: Payer: Self-pay | Admitting: Family

## 2023-11-02 DIAGNOSIS — Z992 Dependence on renal dialysis: Secondary | ICD-10-CM | POA: Diagnosis not present

## 2023-11-02 DIAGNOSIS — N2581 Secondary hyperparathyroidism of renal origin: Secondary | ICD-10-CM | POA: Diagnosis not present

## 2023-11-02 DIAGNOSIS — N186 End stage renal disease: Secondary | ICD-10-CM | POA: Diagnosis not present

## 2023-11-04 DIAGNOSIS — N2581 Secondary hyperparathyroidism of renal origin: Secondary | ICD-10-CM | POA: Diagnosis not present

## 2023-11-04 DIAGNOSIS — N186 End stage renal disease: Secondary | ICD-10-CM | POA: Diagnosis not present

## 2023-11-04 DIAGNOSIS — Z992 Dependence on renal dialysis: Secondary | ICD-10-CM | POA: Diagnosis not present

## 2023-11-06 DIAGNOSIS — N2581 Secondary hyperparathyroidism of renal origin: Secondary | ICD-10-CM | POA: Diagnosis not present

## 2023-11-06 DIAGNOSIS — N186 End stage renal disease: Secondary | ICD-10-CM | POA: Diagnosis not present

## 2023-11-06 DIAGNOSIS — Z992 Dependence on renal dialysis: Secondary | ICD-10-CM | POA: Diagnosis not present

## 2023-11-09 DIAGNOSIS — N2581 Secondary hyperparathyroidism of renal origin: Secondary | ICD-10-CM | POA: Diagnosis not present

## 2023-11-09 DIAGNOSIS — N186 End stage renal disease: Secondary | ICD-10-CM | POA: Diagnosis not present

## 2023-11-09 DIAGNOSIS — Z992 Dependence on renal dialysis: Secondary | ICD-10-CM | POA: Diagnosis not present

## 2023-11-11 DIAGNOSIS — N186 End stage renal disease: Secondary | ICD-10-CM | POA: Diagnosis not present

## 2023-11-11 DIAGNOSIS — N2581 Secondary hyperparathyroidism of renal origin: Secondary | ICD-10-CM | POA: Diagnosis not present

## 2023-11-11 DIAGNOSIS — Z992 Dependence on renal dialysis: Secondary | ICD-10-CM | POA: Diagnosis not present

## 2023-11-13 DIAGNOSIS — N2581 Secondary hyperparathyroidism of renal origin: Secondary | ICD-10-CM | POA: Diagnosis not present

## 2023-11-13 DIAGNOSIS — N186 End stage renal disease: Secondary | ICD-10-CM | POA: Diagnosis not present

## 2023-11-13 DIAGNOSIS — I129 Hypertensive chronic kidney disease with stage 1 through stage 4 chronic kidney disease, or unspecified chronic kidney disease: Secondary | ICD-10-CM | POA: Diagnosis not present

## 2023-11-13 DIAGNOSIS — Z992 Dependence on renal dialysis: Secondary | ICD-10-CM | POA: Diagnosis not present

## 2023-11-16 DIAGNOSIS — Z992 Dependence on renal dialysis: Secondary | ICD-10-CM | POA: Diagnosis not present

## 2023-11-16 DIAGNOSIS — N2581 Secondary hyperparathyroidism of renal origin: Secondary | ICD-10-CM | POA: Diagnosis not present

## 2023-11-16 DIAGNOSIS — N186 End stage renal disease: Secondary | ICD-10-CM | POA: Diagnosis not present

## 2023-11-18 DIAGNOSIS — N186 End stage renal disease: Secondary | ICD-10-CM | POA: Diagnosis not present

## 2023-11-18 DIAGNOSIS — N2581 Secondary hyperparathyroidism of renal origin: Secondary | ICD-10-CM | POA: Diagnosis not present

## 2023-11-18 DIAGNOSIS — Z992 Dependence on renal dialysis: Secondary | ICD-10-CM | POA: Diagnosis not present

## 2023-11-20 DIAGNOSIS — N186 End stage renal disease: Secondary | ICD-10-CM | POA: Diagnosis not present

## 2023-11-20 DIAGNOSIS — N2581 Secondary hyperparathyroidism of renal origin: Secondary | ICD-10-CM | POA: Diagnosis not present

## 2023-11-20 DIAGNOSIS — Z992 Dependence on renal dialysis: Secondary | ICD-10-CM | POA: Diagnosis not present

## 2023-11-23 DIAGNOSIS — N2581 Secondary hyperparathyroidism of renal origin: Secondary | ICD-10-CM | POA: Diagnosis not present

## 2023-11-23 DIAGNOSIS — Z992 Dependence on renal dialysis: Secondary | ICD-10-CM | POA: Diagnosis not present

## 2023-11-23 DIAGNOSIS — N186 End stage renal disease: Secondary | ICD-10-CM | POA: Diagnosis not present

## 2023-11-25 DIAGNOSIS — N186 End stage renal disease: Secondary | ICD-10-CM | POA: Diagnosis not present

## 2023-11-25 DIAGNOSIS — N2581 Secondary hyperparathyroidism of renal origin: Secondary | ICD-10-CM | POA: Diagnosis not present

## 2023-11-25 DIAGNOSIS — Z992 Dependence on renal dialysis: Secondary | ICD-10-CM | POA: Diagnosis not present

## 2023-11-27 DIAGNOSIS — Z992 Dependence on renal dialysis: Secondary | ICD-10-CM | POA: Diagnosis not present

## 2023-11-27 DIAGNOSIS — N186 End stage renal disease: Secondary | ICD-10-CM | POA: Diagnosis not present

## 2023-11-27 DIAGNOSIS — N2581 Secondary hyperparathyroidism of renal origin: Secondary | ICD-10-CM | POA: Diagnosis not present

## 2023-11-30 DIAGNOSIS — N2581 Secondary hyperparathyroidism of renal origin: Secondary | ICD-10-CM | POA: Diagnosis not present

## 2023-11-30 DIAGNOSIS — Z992 Dependence on renal dialysis: Secondary | ICD-10-CM | POA: Diagnosis not present

## 2023-11-30 DIAGNOSIS — N186 End stage renal disease: Secondary | ICD-10-CM | POA: Diagnosis not present

## 2023-12-02 DIAGNOSIS — Z992 Dependence on renal dialysis: Secondary | ICD-10-CM | POA: Diagnosis not present

## 2023-12-02 DIAGNOSIS — N186 End stage renal disease: Secondary | ICD-10-CM | POA: Diagnosis not present

## 2023-12-02 DIAGNOSIS — N2581 Secondary hyperparathyroidism of renal origin: Secondary | ICD-10-CM | POA: Diagnosis not present

## 2023-12-04 DIAGNOSIS — N186 End stage renal disease: Secondary | ICD-10-CM | POA: Diagnosis not present

## 2023-12-04 DIAGNOSIS — N2581 Secondary hyperparathyroidism of renal origin: Secondary | ICD-10-CM | POA: Diagnosis not present

## 2023-12-04 DIAGNOSIS — Z992 Dependence on renal dialysis: Secondary | ICD-10-CM | POA: Diagnosis not present

## 2023-12-07 DIAGNOSIS — Z992 Dependence on renal dialysis: Secondary | ICD-10-CM | POA: Diagnosis not present

## 2023-12-07 DIAGNOSIS — N186 End stage renal disease: Secondary | ICD-10-CM | POA: Diagnosis not present

## 2023-12-07 DIAGNOSIS — N2581 Secondary hyperparathyroidism of renal origin: Secondary | ICD-10-CM | POA: Diagnosis not present

## 2023-12-09 DIAGNOSIS — N186 End stage renal disease: Secondary | ICD-10-CM | POA: Diagnosis not present

## 2023-12-09 DIAGNOSIS — Z992 Dependence on renal dialysis: Secondary | ICD-10-CM | POA: Diagnosis not present

## 2023-12-09 DIAGNOSIS — N2581 Secondary hyperparathyroidism of renal origin: Secondary | ICD-10-CM | POA: Diagnosis not present

## 2023-12-11 DIAGNOSIS — I129 Hypertensive chronic kidney disease with stage 1 through stage 4 chronic kidney disease, or unspecified chronic kidney disease: Secondary | ICD-10-CM | POA: Diagnosis not present

## 2023-12-11 DIAGNOSIS — N186 End stage renal disease: Secondary | ICD-10-CM | POA: Diagnosis not present

## 2023-12-11 DIAGNOSIS — N2581 Secondary hyperparathyroidism of renal origin: Secondary | ICD-10-CM | POA: Diagnosis not present

## 2023-12-11 DIAGNOSIS — Z992 Dependence on renal dialysis: Secondary | ICD-10-CM | POA: Diagnosis not present

## 2023-12-14 DIAGNOSIS — Z992 Dependence on renal dialysis: Secondary | ICD-10-CM | POA: Diagnosis not present

## 2023-12-14 DIAGNOSIS — N2581 Secondary hyperparathyroidism of renal origin: Secondary | ICD-10-CM | POA: Diagnosis not present

## 2023-12-14 DIAGNOSIS — N186 End stage renal disease: Secondary | ICD-10-CM | POA: Diagnosis not present

## 2023-12-16 DIAGNOSIS — N186 End stage renal disease: Secondary | ICD-10-CM | POA: Diagnosis not present

## 2023-12-16 DIAGNOSIS — N2581 Secondary hyperparathyroidism of renal origin: Secondary | ICD-10-CM | POA: Diagnosis not present

## 2023-12-16 DIAGNOSIS — Z992 Dependence on renal dialysis: Secondary | ICD-10-CM | POA: Diagnosis not present

## 2023-12-18 DIAGNOSIS — Z992 Dependence on renal dialysis: Secondary | ICD-10-CM | POA: Diagnosis not present

## 2023-12-18 DIAGNOSIS — N2581 Secondary hyperparathyroidism of renal origin: Secondary | ICD-10-CM | POA: Diagnosis not present

## 2023-12-18 DIAGNOSIS — N186 End stage renal disease: Secondary | ICD-10-CM | POA: Diagnosis not present

## 2023-12-21 DIAGNOSIS — Z992 Dependence on renal dialysis: Secondary | ICD-10-CM | POA: Diagnosis not present

## 2023-12-21 DIAGNOSIS — N2581 Secondary hyperparathyroidism of renal origin: Secondary | ICD-10-CM | POA: Diagnosis not present

## 2023-12-21 DIAGNOSIS — N186 End stage renal disease: Secondary | ICD-10-CM | POA: Diagnosis not present

## 2023-12-23 DIAGNOSIS — N2581 Secondary hyperparathyroidism of renal origin: Secondary | ICD-10-CM | POA: Diagnosis not present

## 2023-12-23 DIAGNOSIS — Z992 Dependence on renal dialysis: Secondary | ICD-10-CM | POA: Diagnosis not present

## 2023-12-23 DIAGNOSIS — N186 End stage renal disease: Secondary | ICD-10-CM | POA: Diagnosis not present

## 2023-12-25 DIAGNOSIS — Z992 Dependence on renal dialysis: Secondary | ICD-10-CM | POA: Diagnosis not present

## 2023-12-25 DIAGNOSIS — N186 End stage renal disease: Secondary | ICD-10-CM | POA: Diagnosis not present

## 2023-12-25 DIAGNOSIS — N2581 Secondary hyperparathyroidism of renal origin: Secondary | ICD-10-CM | POA: Diagnosis not present

## 2023-12-28 DIAGNOSIS — N186 End stage renal disease: Secondary | ICD-10-CM | POA: Diagnosis not present

## 2023-12-28 DIAGNOSIS — N2581 Secondary hyperparathyroidism of renal origin: Secondary | ICD-10-CM | POA: Diagnosis not present

## 2023-12-28 DIAGNOSIS — Z992 Dependence on renal dialysis: Secondary | ICD-10-CM | POA: Diagnosis not present

## 2023-12-30 DIAGNOSIS — N186 End stage renal disease: Secondary | ICD-10-CM | POA: Diagnosis not present

## 2023-12-30 DIAGNOSIS — N2581 Secondary hyperparathyroidism of renal origin: Secondary | ICD-10-CM | POA: Diagnosis not present

## 2023-12-30 DIAGNOSIS — Z992 Dependence on renal dialysis: Secondary | ICD-10-CM | POA: Diagnosis not present

## 2024-01-01 DIAGNOSIS — Z992 Dependence on renal dialysis: Secondary | ICD-10-CM | POA: Diagnosis not present

## 2024-01-01 DIAGNOSIS — N186 End stage renal disease: Secondary | ICD-10-CM | POA: Diagnosis not present

## 2024-01-01 DIAGNOSIS — N2581 Secondary hyperparathyroidism of renal origin: Secondary | ICD-10-CM | POA: Diagnosis not present

## 2024-01-04 DIAGNOSIS — Z992 Dependence on renal dialysis: Secondary | ICD-10-CM | POA: Diagnosis not present

## 2024-01-04 DIAGNOSIS — N2581 Secondary hyperparathyroidism of renal origin: Secondary | ICD-10-CM | POA: Diagnosis not present

## 2024-01-04 DIAGNOSIS — N186 End stage renal disease: Secondary | ICD-10-CM | POA: Diagnosis not present

## 2024-01-06 DIAGNOSIS — N2581 Secondary hyperparathyroidism of renal origin: Secondary | ICD-10-CM | POA: Diagnosis not present

## 2024-01-06 DIAGNOSIS — N186 End stage renal disease: Secondary | ICD-10-CM | POA: Diagnosis not present

## 2024-01-06 DIAGNOSIS — Z992 Dependence on renal dialysis: Secondary | ICD-10-CM | POA: Diagnosis not present

## 2024-01-08 DIAGNOSIS — N2581 Secondary hyperparathyroidism of renal origin: Secondary | ICD-10-CM | POA: Diagnosis not present

## 2024-01-08 DIAGNOSIS — N186 End stage renal disease: Secondary | ICD-10-CM | POA: Diagnosis not present

## 2024-01-08 DIAGNOSIS — Z992 Dependence on renal dialysis: Secondary | ICD-10-CM | POA: Diagnosis not present

## 2024-01-11 DIAGNOSIS — I129 Hypertensive chronic kidney disease with stage 1 through stage 4 chronic kidney disease, or unspecified chronic kidney disease: Secondary | ICD-10-CM | POA: Diagnosis not present

## 2024-01-11 DIAGNOSIS — N186 End stage renal disease: Secondary | ICD-10-CM | POA: Diagnosis not present

## 2024-01-11 DIAGNOSIS — N2581 Secondary hyperparathyroidism of renal origin: Secondary | ICD-10-CM | POA: Diagnosis not present

## 2024-01-11 DIAGNOSIS — Z992 Dependence on renal dialysis: Secondary | ICD-10-CM | POA: Diagnosis not present

## 2024-01-13 DIAGNOSIS — N186 End stage renal disease: Secondary | ICD-10-CM | POA: Diagnosis not present

## 2024-01-13 DIAGNOSIS — Z992 Dependence on renal dialysis: Secondary | ICD-10-CM | POA: Diagnosis not present

## 2024-01-13 DIAGNOSIS — N2581 Secondary hyperparathyroidism of renal origin: Secondary | ICD-10-CM | POA: Diagnosis not present

## 2024-01-15 DIAGNOSIS — N2581 Secondary hyperparathyroidism of renal origin: Secondary | ICD-10-CM | POA: Diagnosis not present

## 2024-01-15 DIAGNOSIS — N186 End stage renal disease: Secondary | ICD-10-CM | POA: Diagnosis not present

## 2024-01-15 DIAGNOSIS — Z992 Dependence on renal dialysis: Secondary | ICD-10-CM | POA: Diagnosis not present

## 2024-01-18 DIAGNOSIS — N186 End stage renal disease: Secondary | ICD-10-CM | POA: Diagnosis not present

## 2024-01-18 DIAGNOSIS — N2581 Secondary hyperparathyroidism of renal origin: Secondary | ICD-10-CM | POA: Diagnosis not present

## 2024-01-18 DIAGNOSIS — Z992 Dependence on renal dialysis: Secondary | ICD-10-CM | POA: Diagnosis not present

## 2024-01-20 DIAGNOSIS — Z992 Dependence on renal dialysis: Secondary | ICD-10-CM | POA: Diagnosis not present

## 2024-01-20 DIAGNOSIS — N2581 Secondary hyperparathyroidism of renal origin: Secondary | ICD-10-CM | POA: Diagnosis not present

## 2024-01-20 DIAGNOSIS — N186 End stage renal disease: Secondary | ICD-10-CM | POA: Diagnosis not present

## 2024-01-21 ENCOUNTER — Ambulatory Visit: Payer: Medicare HMO

## 2024-01-22 DIAGNOSIS — Z992 Dependence on renal dialysis: Secondary | ICD-10-CM | POA: Diagnosis not present

## 2024-01-22 DIAGNOSIS — N186 End stage renal disease: Secondary | ICD-10-CM | POA: Diagnosis not present

## 2024-01-22 DIAGNOSIS — N2581 Secondary hyperparathyroidism of renal origin: Secondary | ICD-10-CM | POA: Diagnosis not present

## 2024-01-25 DIAGNOSIS — Z992 Dependence on renal dialysis: Secondary | ICD-10-CM | POA: Diagnosis not present

## 2024-01-25 DIAGNOSIS — N2581 Secondary hyperparathyroidism of renal origin: Secondary | ICD-10-CM | POA: Diagnosis not present

## 2024-01-25 DIAGNOSIS — N186 End stage renal disease: Secondary | ICD-10-CM | POA: Diagnosis not present

## 2024-01-27 DIAGNOSIS — N2581 Secondary hyperparathyroidism of renal origin: Secondary | ICD-10-CM | POA: Diagnosis not present

## 2024-01-27 DIAGNOSIS — Z992 Dependence on renal dialysis: Secondary | ICD-10-CM | POA: Diagnosis not present

## 2024-01-27 DIAGNOSIS — N186 End stage renal disease: Secondary | ICD-10-CM | POA: Diagnosis not present

## 2024-01-29 DIAGNOSIS — N2581 Secondary hyperparathyroidism of renal origin: Secondary | ICD-10-CM | POA: Diagnosis not present

## 2024-01-29 DIAGNOSIS — Z992 Dependence on renal dialysis: Secondary | ICD-10-CM | POA: Diagnosis not present

## 2024-01-29 DIAGNOSIS — N186 End stage renal disease: Secondary | ICD-10-CM | POA: Diagnosis not present

## 2024-02-01 DIAGNOSIS — Z992 Dependence on renal dialysis: Secondary | ICD-10-CM | POA: Diagnosis not present

## 2024-02-01 DIAGNOSIS — N186 End stage renal disease: Secondary | ICD-10-CM | POA: Diagnosis not present

## 2024-02-01 DIAGNOSIS — N2581 Secondary hyperparathyroidism of renal origin: Secondary | ICD-10-CM | POA: Diagnosis not present

## 2024-02-03 DIAGNOSIS — N2581 Secondary hyperparathyroidism of renal origin: Secondary | ICD-10-CM | POA: Diagnosis not present

## 2024-02-03 DIAGNOSIS — Z992 Dependence on renal dialysis: Secondary | ICD-10-CM | POA: Diagnosis not present

## 2024-02-03 DIAGNOSIS — N186 End stage renal disease: Secondary | ICD-10-CM | POA: Diagnosis not present

## 2024-02-04 ENCOUNTER — Encounter (HOSPITAL_COMMUNITY)
Admission: RE | Disposition: A | Discharge status: Home / Self Care | Payer: Self-pay | Source: Home / Self Care | Attending: Nephrology

## 2024-02-04 ENCOUNTER — Ambulatory Visit (HOSPITAL_COMMUNITY)
Admission: RE | Admit: 2024-02-04 | Discharge: 2024-02-04 | Disposition: A | Discharge status: Home / Self Care | Attending: Nephrology | Admitting: Nephrology

## 2024-02-04 ENCOUNTER — Other Ambulatory Visit: Payer: Self-pay

## 2024-02-04 DIAGNOSIS — N250 Renal osteodystrophy: Secondary | ICD-10-CM | POA: Insufficient documentation

## 2024-02-04 DIAGNOSIS — E0590 Thyrotoxicosis, unspecified without thyrotoxic crisis or storm: Secondary | ICD-10-CM | POA: Insufficient documentation

## 2024-02-04 DIAGNOSIS — N186 End stage renal disease: Secondary | ICD-10-CM | POA: Diagnosis not present

## 2024-02-04 DIAGNOSIS — T82858A Stenosis of vascular prosthetic devices, implants and grafts, initial encounter: Secondary | ICD-10-CM | POA: Insufficient documentation

## 2024-02-04 DIAGNOSIS — I871 Compression of vein: Secondary | ICD-10-CM | POA: Diagnosis not present

## 2024-02-04 DIAGNOSIS — Y832 Surgical operation with anastomosis, bypass or graft as the cause of abnormal reaction of the patient, or of later complication, without mention of misadventure at the time of the procedure: Secondary | ICD-10-CM | POA: Diagnosis not present

## 2024-02-04 DIAGNOSIS — E213 Hyperparathyroidism, unspecified: Secondary | ICD-10-CM | POA: Insufficient documentation

## 2024-02-04 DIAGNOSIS — I132 Hypertensive heart and chronic kidney disease with heart failure and with stage 5 chronic kidney disease, or end stage renal disease: Secondary | ICD-10-CM | POA: Insufficient documentation

## 2024-02-04 DIAGNOSIS — D631 Anemia in chronic kidney disease: Secondary | ICD-10-CM | POA: Insufficient documentation

## 2024-02-04 DIAGNOSIS — Z992 Dependence on renal dialysis: Secondary | ICD-10-CM | POA: Diagnosis not present

## 2024-02-04 DIAGNOSIS — Z79899 Other long term (current) drug therapy: Secondary | ICD-10-CM | POA: Insufficient documentation

## 2024-02-04 DIAGNOSIS — I5032 Chronic diastolic (congestive) heart failure: Secondary | ICD-10-CM | POA: Diagnosis not present

## 2024-02-04 HISTORY — PX: A/V FISTULAGRAM: CATH118298

## 2024-02-04 SURGERY — A/V FISTULAGRAM
Anesthesia: LOCAL

## 2024-02-04 MED ORDER — IODIXANOL 320 MG/ML IV SOLN
INTRAVENOUS | Status: DC | PRN
  Administered 2024-02-04: 10 mL via INTRAVENOUS

## 2024-02-04 MED ORDER — LIDOCAINE HCL (PF) 1 % IJ SOLN
INTRAMUSCULAR | Status: DC | PRN
  Administered 2024-02-04: 2 mL via SUBCUTANEOUS

## 2024-02-04 MED ORDER — LIDOCAINE HCL (PF) 1 % IJ SOLN
INTRAMUSCULAR | Status: AC
  Filled 2024-02-04: qty 30

## 2024-02-04 MED ORDER — HEPARIN (PORCINE) IN NACL 1000-0.9 UT/500ML-% IV SOLN
INTRAVENOUS | Status: DC | PRN
  Administered 2024-02-04: 500 mL

## 2024-02-04 SURGICAL SUPPLY — 7 items
BAG SNAP BAND KOVER 36X36 (MISCELLANEOUS) ×1 IMPLANT
CATH ANGIO 5F BER2 65CM (CATHETERS) IMPLANT
COVER DOME SNAP 22 D (MISCELLANEOUS) ×1 IMPLANT
GUIDEWIRE ANGLED .035 180CM (WIRE) IMPLANT
SHEATH PINNACLE R/O II 6F 4CM (SHEATH) IMPLANT
TRAY PV CATH (CUSTOM PROCEDURE TRAY) ×1 IMPLANT
WIRE BENTSON .035X145CM (WIRE) IMPLANT

## 2024-02-04 NOTE — H&P (Addendum)
 Chief Complaint: Decreased flows  Interval H&P  The patient has presented today for an angiogram/ angioplasty.  Various methods of treatment have been discussed with the patient.  After consideration of risk, benefits and other options for treatment, the patient has consented to a angiogram/ angioplasty with  possible stent placement.   Risks of angiogram with potential angioplasty and stenting if needed.contrast reaction, extravasation/ bleeding, dissection, hypotension and death were explained to the patient.  The patient's history has been reviewed and the patient has been examined, no changes in status.  Stable for angiogram/angioplasty  I have reviewed the patient's chart and labs.  Questions were answered to the patient's satisfaction.  Assessment/Plan: ESRD dialyzing at [facility] MWF Decreased access flows - planning on angiogram with possibly angioplasty. Renal osteodystrophy - continue binders per home regimen. Anemia - managed with ESA's and IV iron at dialysis center. HTN - resume home regimen.   HPI: Cynthia Bray is an 63 y.o. female with history of congestive heart failure with a EF of 25%, hypertension, hyperparathyroid s/p PTX, hyperthyroidism, ESRD on dialysis Monday Wednesdays and Fridays referred here for decreased flows.  ROS Per HPI.  Chemistry and CBC: Creatinine, Ser  Date/Time Value Ref Range Status  10/13/2023 06:43 PM 11.90 (H) 0.44 - 1.00 mg/dL Final  78/29/5621 30:86 PM 11.71 (H) 0.44 - 1.00 mg/dL Final  57/84/6962 95:28 AM 5.57 (H) 0.44 - 1.00 mg/dL Final  41/32/4401 02:72 PM 5.52 (H) 0.44 - 1.00 mg/dL Final  53/66/4403 47:42 AM 10.22 (H) 0.44 - 1.00 mg/dL Final  59/56/3875 64:33 AM 8.07 (H) 0.44 - 1.00 mg/dL Final  29/51/8841 66:06 AM 6.50 (H) 0.44 - 1.00 mg/dL Final  30/16/0109 32:35 AM 5.95 (H) 0.44 - 1.00 mg/dL Final  57/32/2025 42:70 PM 1.08 (H) 0.44 - 1.00 mg/dL Final  62/37/6283 15:17 AM 4.99 (H) 0.44 - 1.00 mg/dL Final    Comment:    DELTA CHECK  NOTED  04/14/2022 02:48 AM 9.09 (H) 0.44 - 1.00 mg/dL Final  61/60/7371 06:26 PM 7.04 (H) 0.44 - 1.00 mg/dL Final  94/85/4627 03:50 AM 7.16 (H) 0.44 - 1.00 mg/dL Final  09/38/1829 93:71 PM 9.52 (H) 0.44 - 1.00 mg/dL Final  69/67/8938 10:17 AM 9.16 (H) 0.44 - 1.00 mg/dL Final  51/11/5850 77:82 PM 7.96 (H) 0.44 - 1.00 mg/dL Final  42/35/3614 43:15 PM 3.15 (H) 0.44 - 1.00 mg/dL Final    Comment:    DELTA CHECK NOTED  04/04/2022 06:25 AM 8.04 (H) 0.44 - 1.00 mg/dL Final  40/05/6760 95:09 AM 8.14 (H) 0.44 - 1.00 mg/dL Final  32/67/1245 80:99 PM 7.94 (H) 0.44 - 1.00 mg/dL Final  83/38/2505 39:76 PM 7.77 (H) 0.44 - 1.00 mg/dL Final  73/41/9379 02:40 PM 7.35 (H) 0.44 - 1.00 mg/dL Final  97/35/3299 24:26 PM 7.33 (H) 0.44 - 1.00 mg/dL Final  83/41/9622 29:79 AM 6.74 (H) 0.44 - 1.00 mg/dL Final  89/[birth year]/1941 74:08 PM 9.58 (H) 0.44 - 1.00 mg/dL Final  14/48/1856 31:49 AM 2.62 (H) 0.44 - 1.00 mg/dL Final    Comment:    DELTA CHECK NOTED  05/09/2021 12:51 AM 3.64 (H) 0.44 - 1.00 mg/dL Final    Comment:    DELTA CHECK NOTED  05/08/2021 08:31 AM 8.08 (H) 0.44 - 1.00 mg/dL Final  70/26/3785 88:50 PM 7.38 (H) 0.44 - 1.00 mg/dL Final   No results for input(s): "NA", "K", "CL", "CO2", "GLUCOSE", "BUN", "CREATININE", "CALCIUM ", "PHOS" in the last 168 hours.  Invalid input(s): "ALB" No results for input(s): "WBC", "NEUTROABS", "HGB", "  HCT", "MCV", "PLT" in the last 168 hours. Liver Function Tests: No results for input(s): "AST", "ALT", "ALKPHOS", "BILITOT", "PROT", "ALBUMIN" in the last 168 hours. No results for input(s): "LIPASE", "AMYLASE" in the last 168 hours. No results for input(s): "AMMONIA" in the last 168 hours. Cardiac Enzymes: No results for input(s): "CKTOTAL", "CKMB", "CKMBINDEX", "TROPONINI" in the last 168 hours. Iron Studies: No results for input(s): "IRON", "TIBC", "TRANSFERRIN", "FERRITIN" in the last 72 hours. PT/INR: @LABRCNTIP (inr:5)  Xrays/Other Studies: )No results found for  this or any previous visit (from the past 48 hours). No results found.  PMH:   Past Medical History:  Diagnosis Date   CHF (congestive heart failure) (HCC)    EF 25%   ESRD (end stage renal disease) (HCC)    GERD (gastroesophageal reflux disease)    Hypertension    Hyperthyroidism    Renal disorder     PSH:   Past Surgical History:  Procedure Laterality Date   AV FISTULA PLACEMENT     CARDIAC CATHETERIZATION     IR FLUORO GUIDE CV LINE RIGHT  04/10/2022   IR REMOVAL TUN CV CATH W/O FL  04/29/2022   IR US  GUIDE VASC ACCESS RIGHT  04/10/2022   PARATHYROIDECTOMY N/A 04/03/2022   Procedure: NECK EXPLORATION, LEFT SUPERIOR PARATHYROIDECTOMY, LEFT INFERIOR PARATHYROIDECTOMY, RIGHT SUPERIOR PARATHYROIDECTOMY;  Surgeon: Oralee Billow, MD;  Location: [facility] OR;  Service: General;  Laterality: N/A;   VASCULAR SURGERY     fistula bilaterally. left arm removed    Allergies:  Allergies  Allergen Reactions   Sensipar  [Cinacalcet ] Nausea And Vomiting   Aplisol [Tuberculin, Ppd] Swelling   Lisinopril Swelling    Angioedema    Penicillin G Swelling    Medications:   Prior to Admission medications   Medication Sig Start Date End Date Taking? Authorizing Provider  acetaminophen  (TYLENOL ) 500 MG tablet Take 1,000 mg by mouth daily as needed for mild pain, moderate pain, fever or headache.   Yes [provider]  aspirin  EC 81 MG tablet Take 81 mg by mouth in the morning.   Yes [provider]  calcitRIOL  (ROCALTROL ) 0.5 MCG capsule Take 8 capsules (4 mcg total) by mouth daily. 04/16/22  Yes Stovall, Kathryn R, PA-C  meclizine (ANTIVERT) 25 MG tablet Take 25 mg by mouth daily as needed for dizziness. 07/30/22  Yes [provider]  multivitamin (RENA-VIT) TABS tablet Take 1 tablet by mouth at bedtime. Patient taking differently: Take 1 tablet by mouth in the morning. 05/10/21  Yes Audria Leather, MD  VELPHORO 500 MG chewable tablet Chew 500 mg by mouth 3 (three) times daily  with meals. 07/14/22  Yes [provider]  benzonatate  (TESSALON  PERLES) 100 MG capsule Take 1 capsule (100 mg total) by mouth 3 (three) times daily as needed for cough. Patient not taking: Reported on 02/01/2024 10/27/23   Clearnce Curia, NP  Calcium  Acetate 668 (169 Ca) MG TABS Take 2,004 mg by mouth with breakfast, with lunch, and with evening meal. Patient not taking: Reported on 02/01/2024    [provider]  ondansetron  (ZOFRAN ) 4 MG tablet Take 1 tablet (4 mg total) by mouth every 6 (six) hours. Patient not taking: Reported on 02/01/2024 10/13/23   Spence Dux, PA-C  ondansetron  (ZOFRAN -ODT) 4 MG disintegrating tablet Take 1 tablet (4 mg total) by mouth every 8 (eight) hours as needed for nausea or vomiting. Patient not taking: Reported on 02/01/2024 02/23/23   Merdis Stalling, MD  rosuvastatin  (CRESTOR ) 10 MG tablet  Take 1 tablet (10 mg total) by mouth daily. Patient not taking: Reported on 02/01/2024 10/27/23 01/25/24  Clearnce Curia, NP    Discontinued Meds:  There are no discontinued medications.  Social History:  reports that she has never smoked. She has never been exposed to tobacco smoke. She has never used smokeless tobacco. She reports that she does not drink alcohol and does not use drugs.  Family History:   Family History  Problem Relation Age of Onset   Kidney disease Mother    Heart failure Sister    Heart attack Sister    BRCA 1/2 Neg Hx    Breast cancer Neg Hx     Blood pressure 100/71, pulse 89, temperature 98.2 F (36.8 C), resp. rate 12, height 5\' 8"  (1.727 m), weight 61.2 kg, SpO2 97%. GEN: NAD, A&Ox3, NCAT HEENT: No conjunctival pallor, EOMI NECK: Supple, no thyromegaly LUNGS: CTA B/L no rales, rhonchi or wheezing CV: RRR, No M/R/G ABD: SNDNT +BS  EXT: No lower extremity edema ACCESS: RUA AVG hyperpulsatile towards the outflow       Mel Tadros, Alveda Aures, MD 02/04/2024, 1:26 PM

## 2024-02-04 NOTE — Discharge Instructions (Signed)

## 2024-02-04 NOTE — Op Note (Signed)
 Patient presents with decreasing AF in a right upper arm AV graft placed in [place] over 5 years ago.  On exam the right UE AVG is hyperpulsatile towards the outflow.  Summary:  1) 100% occlusion at the [facility] level with retrograde flow through orthogonal brachial veins to a patent axillary and central veins.  Body of the graft and inflow are patent as well.  2) Central veins were widely patent unfortunately unable to cross the 100% occlusion which was rounded off. 3) This right upper arm straight graft remains amenable to very limited future percutaneous intervention. Needs surgical revision or a new access.  Description of procedure: The right upper arm was prepped and draped in the usual fashion. The right upper arm straight AVG was cannulated ([id number]) in the arterial limb of the graft in an antegrade direction with an 18G Angiocath needle and then a 6 Fr sheath was inserted by guidewire exchange technique. The angiogram revealed a patent body of the graft with a 100% venous anastomosis stenosis/lesion with retrograde flow through orthogonal branches to a patent brachial vein. The central veins and inflow were patent.  A guidewire was advanced to the ball of the outflow lesion and despite the use of a Bernstein guiding catheter I could not cross the occlusion.    Hemostasis: A 3-0 ethilon purse string suture was placed at the cannulation site on removal of the sheath.  Sedation: None  Diagnoses: I87.1 Stricture of vein  N18.6 ESRD T82.858A Stricture of access  Procedure Coding:  36901 Cannulation and angiogram of fistula Q9967 Contrast  Recommendations:  1. Continue to cannulate the fistula with 15G needles.  2.  Referring to [facility] to see if they can do a revision with a bypass graft to salvage the access. 3. Remove the suture next treatment.   Discharge: The patient was discharged home in stable condition. The patient was given education regarding the care of the dialysis access AVF and  specific instructions in case of any problems.

## 2024-02-05 DIAGNOSIS — Z992 Dependence on renal dialysis: Secondary | ICD-10-CM | POA: Diagnosis not present

## 2024-02-05 DIAGNOSIS — N2581 Secondary hyperparathyroidism of renal origin: Secondary | ICD-10-CM | POA: Diagnosis not present

## 2024-02-05 DIAGNOSIS — N186 End stage renal disease: Secondary | ICD-10-CM | POA: Diagnosis not present

## 2024-02-06 ENCOUNTER — Encounter (HOSPITAL_COMMUNITY): Payer: Self-pay | Admitting: Nephrology

## 2024-02-08 ENCOUNTER — Telehealth: Payer: Self-pay | Admitting: Vascular Surgery

## 2024-02-08 DIAGNOSIS — N2581 Secondary hyperparathyroidism of renal origin: Secondary | ICD-10-CM | POA: Diagnosis not present

## 2024-02-08 DIAGNOSIS — Z992 Dependence on renal dialysis: Secondary | ICD-10-CM | POA: Diagnosis not present

## 2024-02-08 DIAGNOSIS — N186 End stage renal disease: Secondary | ICD-10-CM | POA: Diagnosis not present

## 2024-02-08 NOTE — Telephone Encounter (Signed)
 Pt appt scheduled

## 2024-02-10 DIAGNOSIS — N2581 Secondary hyperparathyroidism of renal origin: Secondary | ICD-10-CM | POA: Diagnosis not present

## 2024-02-10 DIAGNOSIS — N186 End stage renal disease: Secondary | ICD-10-CM | POA: Diagnosis not present

## 2024-02-10 DIAGNOSIS — I129 Hypertensive chronic kidney disease with stage 1 through stage 4 chronic kidney disease, or unspecified chronic kidney disease: Secondary | ICD-10-CM | POA: Diagnosis not present

## 2024-02-10 DIAGNOSIS — Z992 Dependence on renal dialysis: Secondary | ICD-10-CM | POA: Diagnosis not present

## 2024-02-12 DIAGNOSIS — Z992 Dependence on renal dialysis: Secondary | ICD-10-CM | POA: Diagnosis not present

## 2024-02-12 DIAGNOSIS — N2581 Secondary hyperparathyroidism of renal origin: Secondary | ICD-10-CM | POA: Diagnosis not present

## 2024-02-12 DIAGNOSIS — N186 End stage renal disease: Secondary | ICD-10-CM | POA: Diagnosis not present

## 2024-02-15 DIAGNOSIS — N2581 Secondary hyperparathyroidism of renal origin: Secondary | ICD-10-CM | POA: Diagnosis not present

## 2024-02-15 DIAGNOSIS — Z992 Dependence on renal dialysis: Secondary | ICD-10-CM | POA: Diagnosis not present

## 2024-02-15 DIAGNOSIS — N186 End stage renal disease: Secondary | ICD-10-CM | POA: Diagnosis not present

## 2024-02-17 DIAGNOSIS — N186 End stage renal disease: Secondary | ICD-10-CM | POA: Diagnosis not present

## 2024-02-17 DIAGNOSIS — N2581 Secondary hyperparathyroidism of renal origin: Secondary | ICD-10-CM | POA: Diagnosis not present

## 2024-02-17 DIAGNOSIS — Z992 Dependence on renal dialysis: Secondary | ICD-10-CM | POA: Diagnosis not present

## 2024-02-19 DIAGNOSIS — Z992 Dependence on renal dialysis: Secondary | ICD-10-CM | POA: Diagnosis not present

## 2024-02-19 DIAGNOSIS — N186 End stage renal disease: Secondary | ICD-10-CM | POA: Diagnosis not present

## 2024-02-19 DIAGNOSIS — N2581 Secondary hyperparathyroidism of renal origin: Secondary | ICD-10-CM | POA: Diagnosis not present

## 2024-02-22 DIAGNOSIS — Z992 Dependence on renal dialysis: Secondary | ICD-10-CM | POA: Diagnosis not present

## 2024-02-22 DIAGNOSIS — N2581 Secondary hyperparathyroidism of renal origin: Secondary | ICD-10-CM | POA: Diagnosis not present

## 2024-02-22 DIAGNOSIS — N186 End stage renal disease: Secondary | ICD-10-CM | POA: Diagnosis not present

## 2024-02-22 NOTE — Progress Notes (Unsigned)
 [facility] [place]  ASSESSMENT / PLAN: 63 y.o. female with end-stage renal disease dialyzing via right upper extremity AV graft Monday Wednesday and Friday.  Per the patient's report, the graft is working well.  I counseled her that revision of the graft may be necessary at some point in the future, but is not absolutely necessary now.  Revision would entail interposition bypass from the existing graft to a healthy brachial vein.  She would also need a tunneled dialysis catheter for 6 weeks after healing.  The patient is not interested in this at this time.  I think that is reasonable.  She can follow-up with me as needed.  CHIEF COMPLAINT: decreased flows on HD  HISTORY OF PRESENT ILLNESS: Cynthia Bray is a 63 y.o. female with ESRD on HD MWF via RUE AVG. Patient recently underwent a fistulagram to evaluate a graft with decreased flow at dialysis. This showed a completely occluded outflow vein distal to the venous anastomosis (see below for my interpretation of the films).   The patient reports no issues with dialysis.  She is dialyzing on Monday, Wednesday, and Friday without issue.  We reviewed her fistulogram in detail.  Past Medical History:  Diagnosis Date   CHF (congestive heart failure) (HCC)    EF 25%   ESRD (end stage renal disease) (HCC)    GERD (gastroesophageal reflux disease)    Hypertension    Hyperthyroidism    Renal disorder     Past Surgical History:  Procedure Laterality Date   A/V FISTULAGRAM N/A 02/04/2024   Procedure: A/V Fistulagram;  Surgeon: Patrick Boor, MD;  Location: [facility] INVASIVE CV [facility];  Service: Cardiovascular;  Laterality: N/A;   AV FISTULA PLACEMENT     CARDIAC CATHETERIZATION     IR FLUORO GUIDE CV LINE RIGHT  04/10/2022   IR REMOVAL TUN CV CATH W/O FL  04/29/2022   IR US  GUIDE VASC ACCESS RIGHT  04/10/2022   PARATHYROIDECTOMY N/A 04/03/2022   Procedure: NECK EXPLORATION, LEFT SUPERIOR PARATHYROIDECTOMY, LEFT INFERIOR  PARATHYROIDECTOMY, RIGHT SUPERIOR PARATHYROIDECTOMY;  Surgeon: Oralee Billow, MD;  Location: [facility] OR;  Service: General;  Laterality: N/A;   VASCULAR SURGERY     fistula bilaterally. left arm removed    Family History  Problem Relation Age of Onset   Kidney disease Mother    Heart failure Sister    Heart attack Sister    BRCA 1/2 Neg Hx    Breast cancer Neg Hx     Social History   Socioeconomic History   Marital status: Widowed    Spouse name: Not on file   Number of children: Not on file   Years of education: Not on file   Highest education level: Not on file  Occupational History   Not on file  Tobacco Use   Smoking status: Never    Passive exposure: Never   Smokeless tobacco: Never  Vaping Use   Vaping status: Never Used  Substance and Sexual Activity   Alcohol use: Never   Drug use: Never   Sexual activity: Not on file  Other Topics Concern   Not on file  Social History Narrative   Not on file   Social Drivers of Health   Financial Resource Strain: Not on file  Food Insecurity: No Food Insecurity (09/20/2022)   Hunger Vital Sign    Worried About Running Out of Food in the Last Year: Never true    Ran Out of Food in the Last Year:  Never true  Transportation Needs: No Transportation Needs (09/20/2022)   PRAPARE - Administrator, Civil Service (Medical): No    Lack of Transportation (Non-Medical): No  Physical Activity: Not on file  Stress: Not on file  Social Connections: Not on file  Intimate Partner Violence: Not At Risk (09/20/2022)   Humiliation, Afraid, Rape, and Kick questionnaire    Fear of Current or Ex-Partner: No    Emotionally Abused: No    Physically Abused: No    Sexually Abused: No    Allergies  Allergen Reactions   Sensipar  [Cinacalcet ] Nausea And Vomiting   Aplisol [Tuberculin, Ppd] Swelling   Lisinopril Swelling    Angioedema    Penicillin G Swelling    Current Outpatient Medications  Medication Sig Dispense Refill    acetaminophen  (TYLENOL ) 500 MG tablet Take 1,000 mg by mouth daily as needed for mild pain, moderate pain, fever or headache.     aspirin  EC 81 MG tablet Take 81 mg by mouth in the morning.     calcitRIOL  (ROCALTROL ) 0.5 MCG capsule Take 8 capsules (4 mcg total) by mouth daily. 30 capsule 6   meclizine (ANTIVERT) 25 MG tablet Take 25 mg by mouth daily as needed for dizziness.     multivitamin (RENA-VIT) TABS tablet Take 1 tablet by mouth at bedtime. (Patient taking differently: Take 1 tablet by mouth in the morning.) 30 tablet 0   VELPHORO 500 MG chewable tablet Chew 500 mg by mouth 3 (three) times daily with meals.     benzonatate  (TESSALON  PERLES) 100 MG capsule Take 1 capsule (100 mg total) by mouth 3 (three) times daily as needed for cough. (Patient not taking: Reported on 02/01/2024) 20 capsule 0   ondansetron  (ZOFRAN ) 4 MG tablet Take 1 tablet (4 mg total) by mouth every 6 (six) hours. (Patient not taking: Reported on 02/01/2024) 12 tablet 0   ondansetron  (ZOFRAN -ODT) 4 MG disintegrating tablet Take 1 tablet (4 mg total) by mouth every 8 (eight) hours as needed for nausea or vomiting. (Patient not taking: Reported on 02/01/2024) 20 tablet 0   rosuvastatin  (CRESTOR ) 10 MG tablet Take 1 tablet (10 mg total) by mouth daily. (Patient not taking: Reported on 02/01/2024) 90 tablet 3   No current facility-administered medications for this visit.    PHYSICAL EXAM Vitals:   02/23/24 0822  BP: 119/71  Pulse: 76  Temp: 97.7 F (36.5 C)  SpO2: 97%  Weight: 132 lb (59.9 kg)  Height: 5\' 8"  (1.727 m)   Chronically ill-appearing woman in no distress Regular rate and rhythm Unlabored breathing Right upper extremity AV graft with thrill that is only mildly pulsatile Palpable right radial pulse Some degeneration of the arteriovenous graft from chronic cannulation.  PERTINENT LABORATORY AND RADIOLOGIC DATA  Most recent CBC    Latest Ref Rng & Units 10/13/2023    6:43 PM 09/22/2023   10:04 AM  02/23/2023   12:30 PM  CBC  WBC 3.4 - 10.8 x10E3/uL  4.8  6.0   Hemoglobin 12.0 - 15.0 g/dL 54.0  98.1  19.1   Hematocrit 36.0 - 46.0 % 35.0  33.3  39.2   Platelets 150 - 450 x10E3/uL  205  176      Most recent CMP    Latest Ref Rng & Units 10/13/2023    6:43 PM 09/22/2023   10:04 AM 02/23/2023   12:30 PM  CMP  Glucose 70 - 99 mg/dL 98   75   BUN 8 -  23 mg/dL 18   53   Creatinine 1.61 - 1.00 mg/dL 09.60   45.40   Sodium 135 - 145 mmol/L 140   135   Potassium 3.5 - 5.1 mmol/L 4.0   4.4   Chloride 98 - 111 mmol/L 98   94   CO2 22 - 32 mmol/L   23   Calcium  8.9 - 10.3 mg/dL   9.7   Total Protein 6.0 - 8.5 g/dL  8.0  7.3   Total Bilirubin 0.0 - 1.2 mg/dL  0.9  0.4   Alkaline Phos 44 - 121 IU/L  93  70   AST 0 - 40 IU/L  9  12   ALT 0 - 32 IU/L  6  9     Renal function CrCl cannot be calculated (Patient's most recent lab result is older than the maximum 21 days allowed.).  Hgb A1c MFr Bld (%)  Date Value  09/22/2023 5.4    LDL Chol Calc (NIH)  Date Value Ref Range Status  09/22/2023 138 (H) 0 - 99 mg/dL Final    Fistulagram 9/81/19 The right arm AV access is patent to the venous anastomosis. Central to the venous anastomosis is a venous occlusion. The access drains via collaterals to a paired brachial vein, which courses into the axillary vein and subclavian vein. The central circulation is not well visualized.   Heber Little. Edgardo Goodwill, MD FACS [facility] Phone Number: [phone] 02/23/2024 8:48 AM   Total time spent on preparing this encounter including chart review, data review, collecting history, examining the patient, and coordinating care: 60 minutes.   Portions of this report may have been transcribed using voice recognition software.  Every effort has been made to ensure accuracy; however, inadvertent computerized transcription errors may still be present.

## 2024-02-23 ENCOUNTER — Encounter: Payer: Self-pay | Admitting: Vascular Surgery

## 2024-02-23 ENCOUNTER — Ambulatory Visit: Attending: Vascular Surgery | Admitting: Vascular Surgery

## 2024-02-23 VITALS — BP 119/71 | HR 76 | Temp 97.7°F | Ht 68.0 in | Wt 132.0 lb

## 2024-02-23 DIAGNOSIS — N186 End stage renal disease: Secondary | ICD-10-CM | POA: Diagnosis not present

## 2024-02-23 DIAGNOSIS — Z992 Dependence on renal dialysis: Secondary | ICD-10-CM | POA: Diagnosis not present

## 2024-02-24 DIAGNOSIS — N186 End stage renal disease: Secondary | ICD-10-CM | POA: Diagnosis not present

## 2024-02-24 DIAGNOSIS — Z992 Dependence on renal dialysis: Secondary | ICD-10-CM | POA: Diagnosis not present

## 2024-02-24 DIAGNOSIS — N2581 Secondary hyperparathyroidism of renal origin: Secondary | ICD-10-CM | POA: Diagnosis not present

## 2024-02-26 DIAGNOSIS — N2581 Secondary hyperparathyroidism of renal origin: Secondary | ICD-10-CM | POA: Diagnosis not present

## 2024-02-26 DIAGNOSIS — Z992 Dependence on renal dialysis: Secondary | ICD-10-CM | POA: Diagnosis not present

## 2024-02-26 DIAGNOSIS — N186 End stage renal disease: Secondary | ICD-10-CM | POA: Diagnosis not present

## 2024-02-29 DIAGNOSIS — N2581 Secondary hyperparathyroidism of renal origin: Secondary | ICD-10-CM | POA: Diagnosis not present

## 2024-02-29 DIAGNOSIS — N186 End stage renal disease: Secondary | ICD-10-CM | POA: Diagnosis not present

## 2024-02-29 DIAGNOSIS — Z992 Dependence on renal dialysis: Secondary | ICD-10-CM | POA: Diagnosis not present

## 2024-03-01 ENCOUNTER — Encounter (HOSPITAL_BASED_OUTPATIENT_CLINIC_OR_DEPARTMENT_OTHER): Payer: Self-pay | Admitting: Cardiovascular Disease

## 2024-03-01 ENCOUNTER — Ambulatory Visit (INDEPENDENT_AMBULATORY_CARE_PROVIDER_SITE_OTHER): Payer: Medicare HMO | Admitting: Cardiovascular Disease

## 2024-03-01 VITALS — BP 112/64 | HR 77 | Ht 68.0 in | Wt 132.6 lb

## 2024-03-01 DIAGNOSIS — I2510 Atherosclerotic heart disease of native coronary artery without angina pectoris: Secondary | ICD-10-CM | POA: Diagnosis not present

## 2024-03-01 DIAGNOSIS — N186 End stage renal disease: Secondary | ICD-10-CM | POA: Diagnosis not present

## 2024-03-01 DIAGNOSIS — Z992 Dependence on renal dialysis: Secondary | ICD-10-CM | POA: Diagnosis not present

## 2024-03-01 DIAGNOSIS — I151 Hypertension secondary to other renal disorders: Secondary | ICD-10-CM | POA: Diagnosis not present

## 2024-03-01 DIAGNOSIS — I519 Heart disease, unspecified: Secondary | ICD-10-CM

## 2024-03-01 DIAGNOSIS — R0609 Other forms of dyspnea: Secondary | ICD-10-CM

## 2024-03-01 MED ORDER — ROSUVASTATIN CALCIUM 40 MG PO TABS: 40.0000 mg | ORAL_TABLET | Freq: Every day | ORAL | 3 refills | Status: AC

## 2024-03-01 NOTE — Patient Instructions (Signed)
 Medication Instructions:  START ROSUVASTATIN  40 MG DAILY   *If you need a refill on your cardiac medications before your next appointment, please call your pharmacy*  Lab Work: FASTING LP/CMET IN 2 TO 3 MONTHS   If you have labs (blood work) drawn today and your tests are completely normal, you will receive your results only by: MyChart Message (if you have MyChart) OR A paper copy in the mail If you have any lab test that is abnormal or we need to change your treatment, we will call you to review the results.  Testing/Procedures: Your physician has requested that you have en exercise stress myoview. For further information please visit [email or web address] Please follow instruction sheet, as given.  Follow-Up: At [facility], you and your health needs are our priority.  As part of our continuing mission to provide you with exceptional heart care, our providers are all part of one team.  This team includes your primary Cardiologist (physician) and Advanced Practice Providers or APPs (Physician Assistants and Nurse Practitioners) who all work together to provide you with the care you need, when you need it.  Your next appointment:   6 month(s)  Provider:   Maudine Sos, MD, Slater Duncan, NP, or Neomi Banks, NP     We recommend signing up for the patient portal called "MyChart".  Sign up information is provided on this After Visit Summary.  MyChart is used to connect with patients for Virtual Visits (Telemedicine).  Patients are able to view lab/test results, encounter notes, upcoming appointments, etc.  Non-urgent messages can be sent to your provider as well.   To learn more about what you can do with MyChart, go to [email or web address].

## 2024-03-01 NOTE — Progress Notes (Signed)
 Cardiology Office Note:  .   Date:  03/01/2024  ID:  Cynthia Bray, DOB 09-01-1961, MRN [id number] PCP: Senaida Dama, NP  [facility] Providers Cardiologist:  None    History of Present Illness: .   Cynthia Bray is a 63 y.o. female with ESRD, CAD, hypertension, chronic systolic and diastolic heart failure who is here for follow up. She was seen in the hospital 04/2021 for a heart failure exacerbation. LVEF on admission 25-30% with mild LVH and grade 3 diastolic dysfunction. She had mild to moderate MR and a small pericardial effusion. At that admission additional fluid was removed with dialysis. She was noted to be very frail and had failure to thrive. She last saw Romualdo Cobble, [place] on 02/2022 and was stable. She was diagnosed with secondary hyperparathyroidism and underwent parathyroidectomy 04/2022.  At her visit 06/2022 she was doing well.  She was exercising regularly by walking.  She had an episode of syncope 09/2022.  It occurred when she tried to stand up after dialysis session.   [name] saw Charles Connor, NP 10/2022 and was doing well.  She was seen in the ED 09/2023 with COVID.  She continued to recover from this when she saw Neomi Banks, NP 10/2023 but was doing well from a cardiac standpoint.  Discussed the use of AI scribe software for clinical note transcription with the patient, who gave verbal consent to proceed.  [name] experiences fatigue during exertion, particularly when walking uphill or climbing stairs, which leaves her out of breath. This fatigue is stable and not a new symptom. No chest pain, pressure, lightheadedness, or dizziness during these activities.  She is currently undergoing dialysis and notes that her blood pressure tends to be low. Occasionally, her dialysis sessions are stopped early due to drops in blood pressure, occurring about once or twice a month. When this happens, she is laid back and the machine is turned off for five to ten minutes before  resuming the session.  She was previously on atorvastatin  for cholesterol management, but she is unsure of her current cholesterol medication. She mentions receiving medication at the dialysis center, which she believes is for cholesterol, but she did not bring it with her to the appointment.  Her cholesterol levels have been high, with an LDL of 138 in December. No muscle aches associated with statin use. She uses [facility] [address] for her prescriptions.  ROS:  As per HPI  Studies Reviewed: Aaron Aas   EKG Interpretation Date/Time:  Tuesday Mar 01 2024 08:51:51 EDT Ventricular Rate:  77 PR Interval:  138 QRS Duration:  82 QT Interval:  374 QTC Calculation: 423 R Axis:   47  Text Interpretation: Normal sinus rhythm with sinus arrhythmia Low voltage QRS Septal infarct , age undetermined No significant change since last tracing Confirmed by Maudine Sos ([id number]) on 03/01/2024 8:59:11 AM    Risk Assessment/Calculations:             Physical Exam:   VS:  BP 112/64 (BP Location: Left Arm, Patient Position: Sitting, Cuff Size: Normal)   Pulse 77   Ht 5\' 8"  (1.727 m)   Wt 132 lb 9.6 oz (60.1 kg)   SpO2 97%   BMI 20.16 kg/m  , BMI Body mass index is 20.16 kg/m. GENERAL:  Well appearing HEENT: Pupils equal round and reactive, fundi not visualized, oral mucosa unremarkable NECK:  No jugular venous distention, waveform within normal limits, carotid upstroke brisk and symmetric, no bruits,  no thyromegaly LUNGS:  Clear to auscultation bilaterally HEART:  RRR.  PMI not displaced or sustained,S1 and S2 within normal limits, no S3, no S4, no clicks, no rubs, no murmurs ABD:  Flat, positive bowel sounds normal in frequency in pitch, no bruits, no rebound, no guarding, no midline pulsatile mass, no hepatomegaly, no splenomegaly EXT:  2 plus pulses throughout, no edema, no cyanosis no clubbing SKIN:  No rashes no nodules NEURO:  Cranial nerves II through XII grossly intact, motor  grossly intact throughout PSYCH:  Cognitively intact, oriented to person place and time   ASSESSMENT AND PLAN: .    # Coronary Artery Disease Coronary artery disease noted on chest CT in 2013. Given her exertional fatigue and only remote coronary evaluation, recommend exercise Myoview to assess for ischemia.  - Continue aspirin  - Exercise Myoview  # Fatigue Chronic exertional fatigue possibly linked to coronary artery disease. Await stress test results for further management. - Conduct stress test to assess coronary artery disease's role in fatigue.  # ESRD on HD:  Chronic kidney disease with hypotensive episodes during dialysis, managed by pausing and resuming dialysis post-stabilization.  # Hyperlipidemia Elevated LDL at 138 mg/dL, exceeding target. Pravastatin inadequate. No statin-associated myalgia or adverse effects. Pravastatin less effective than other statins. - Discontinue pravastatin. - Initiate rosuvastatin  20mg  daily - Reassess lipid profile and CMP  in 2-3 months.  Follow-up Monitor and adjust treatment based on diagnostic outcomes and symptomatology. - Schedule follow-up in 6 months. - Arrange earlier follow-up if stress test indicates abnormalities.  Dispo: f/u 6 months  Signed, Maudine Sos, MD

## 2024-03-02 ENCOUNTER — Telehealth: Payer: Self-pay | Admitting: *Deleted

## 2024-03-02 DIAGNOSIS — N2581 Secondary hyperparathyroidism of renal origin: Secondary | ICD-10-CM | POA: Diagnosis not present

## 2024-03-02 DIAGNOSIS — Z992 Dependence on renal dialysis: Secondary | ICD-10-CM | POA: Diagnosis not present

## 2024-03-02 DIAGNOSIS — N186 End stage renal disease: Secondary | ICD-10-CM | POA: Diagnosis not present

## 2024-03-02 NOTE — Telephone Encounter (Signed)
 Left detailed instructions for MPI study.

## 2024-03-04 DIAGNOSIS — N186 End stage renal disease: Secondary | ICD-10-CM | POA: Diagnosis not present

## 2024-03-04 DIAGNOSIS — Z992 Dependence on renal dialysis: Secondary | ICD-10-CM | POA: Diagnosis not present

## 2024-03-04 DIAGNOSIS — N2581 Secondary hyperparathyroidism of renal origin: Secondary | ICD-10-CM | POA: Diagnosis not present

## 2024-03-07 DIAGNOSIS — Z992 Dependence on renal dialysis: Secondary | ICD-10-CM | POA: Diagnosis not present

## 2024-03-07 DIAGNOSIS — N2581 Secondary hyperparathyroidism of renal origin: Secondary | ICD-10-CM | POA: Diagnosis not present

## 2024-03-07 DIAGNOSIS — N186 End stage renal disease: Secondary | ICD-10-CM | POA: Diagnosis not present

## 2024-03-08 ENCOUNTER — Other Ambulatory Visit (HOSPITAL_BASED_OUTPATIENT_CLINIC_OR_DEPARTMENT_OTHER): Payer: Self-pay | Admitting: Cardiovascular Disease

## 2024-03-08 DIAGNOSIS — R0609 Other forms of dyspnea: Secondary | ICD-10-CM

## 2024-03-09 DIAGNOSIS — Z992 Dependence on renal dialysis: Secondary | ICD-10-CM | POA: Diagnosis not present

## 2024-03-09 DIAGNOSIS — N186 End stage renal disease: Secondary | ICD-10-CM | POA: Diagnosis not present

## 2024-03-09 DIAGNOSIS — N2581 Secondary hyperparathyroidism of renal origin: Secondary | ICD-10-CM | POA: Diagnosis not present

## 2024-03-10 ENCOUNTER — Ambulatory Visit (HOSPITAL_COMMUNITY)
Admission: RE | Admit: 2024-03-10 | Discharge: 2024-03-10 | Disposition: A | Discharge status: Home / Self Care | Source: Ambulatory Visit | Attending: Cardiology | Admitting: Cardiology

## 2024-03-10 DIAGNOSIS — R0609 Other forms of dyspnea: Secondary | ICD-10-CM

## 2024-03-10 DIAGNOSIS — I2510 Atherosclerotic heart disease of native coronary artery without angina pectoris: Secondary | ICD-10-CM | POA: Diagnosis not present

## 2024-03-10 LAB — MYOCARDIAL PERFUSION IMAGING
Angina Index: 0
Duke Treadmill Score: 5
Estimated workload: 4.6
Exercise duration (min): 4 min
Exercise duration (sec): 30 s
LV dias vol: 75 mL (ref 46–106)
LV sys vol: 20 mL
MPHR: 156 {beats}/min
Nuc Stress EF: 73 %
Peak HR: 141 {beats}/min
Percent HR: 90 %
Rest HR: 83 {beats}/min
Rest Nuclear Isotope Dose: 10.2 mCi
SDS: 0
SRS: 1
SSS: 0
ST Depression (mm): 0 mm
Stress Nuclear Isotope Dose: 30.7 mCi
TID: 0.97

## 2024-03-10 MED ORDER — TECHNETIUM TC 99M TETROFOSMIN IV KIT
10.2000 | PACK | Freq: Once | INTRAVENOUS | Status: AC | PRN
  Administered 2024-03-10: 10.2 via INTRAVENOUS

## 2024-03-10 MED ORDER — TECHNETIUM TC 99M PYROPHOSPHATE
30.7000 | Freq: Once | INTRAVENOUS | Status: AC
  Administered 2024-03-10: 30.7 via INTRAVENOUS

## 2024-03-11 ENCOUNTER — Ambulatory Visit: Payer: Self-pay | Admitting: Cardiovascular Disease

## 2024-03-11 DIAGNOSIS — Z992 Dependence on renal dialysis: Secondary | ICD-10-CM | POA: Diagnosis not present

## 2024-03-11 DIAGNOSIS — N2581 Secondary hyperparathyroidism of renal origin: Secondary | ICD-10-CM | POA: Diagnosis not present

## 2024-03-11 DIAGNOSIS — N186 End stage renal disease: Secondary | ICD-10-CM | POA: Diagnosis not present

## 2024-03-12 DIAGNOSIS — Z992 Dependence on renal dialysis: Secondary | ICD-10-CM | POA: Diagnosis not present

## 2024-03-12 DIAGNOSIS — N186 End stage renal disease: Secondary | ICD-10-CM | POA: Diagnosis not present

## 2024-03-12 DIAGNOSIS — I129 Hypertensive chronic kidney disease with stage 1 through stage 4 chronic kidney disease, or unspecified chronic kidney disease: Secondary | ICD-10-CM | POA: Diagnosis not present

## 2024-03-14 DIAGNOSIS — Z992 Dependence on renal dialysis: Secondary | ICD-10-CM | POA: Diagnosis not present

## 2024-03-14 DIAGNOSIS — N186 End stage renal disease: Secondary | ICD-10-CM | POA: Diagnosis not present

## 2024-03-14 DIAGNOSIS — N2581 Secondary hyperparathyroidism of renal origin: Secondary | ICD-10-CM | POA: Diagnosis not present

## 2024-03-15 ENCOUNTER — Ambulatory Visit: Payer: Self-pay | Admitting: Cardiovascular Disease

## 2024-03-16 DIAGNOSIS — N2581 Secondary hyperparathyroidism of renal origin: Secondary | ICD-10-CM | POA: Diagnosis not present

## 2024-03-16 DIAGNOSIS — N186 End stage renal disease: Secondary | ICD-10-CM | POA: Diagnosis not present

## 2024-03-16 DIAGNOSIS — Z992 Dependence on renal dialysis: Secondary | ICD-10-CM | POA: Diagnosis not present

## 2024-03-18 DIAGNOSIS — Z992 Dependence on renal dialysis: Secondary | ICD-10-CM | POA: Diagnosis not present

## 2024-03-18 DIAGNOSIS — N186 End stage renal disease: Secondary | ICD-10-CM | POA: Diagnosis not present

## 2024-03-18 DIAGNOSIS — N2581 Secondary hyperparathyroidism of renal origin: Secondary | ICD-10-CM | POA: Diagnosis not present

## 2024-03-21 DIAGNOSIS — Z992 Dependence on renal dialysis: Secondary | ICD-10-CM | POA: Diagnosis not present

## 2024-03-21 DIAGNOSIS — N2581 Secondary hyperparathyroidism of renal origin: Secondary | ICD-10-CM | POA: Diagnosis not present

## 2024-03-21 DIAGNOSIS — N186 End stage renal disease: Secondary | ICD-10-CM | POA: Diagnosis not present

## 2024-03-23 DIAGNOSIS — N2581 Secondary hyperparathyroidism of renal origin: Secondary | ICD-10-CM | POA: Diagnosis not present

## 2024-03-23 DIAGNOSIS — N186 End stage renal disease: Secondary | ICD-10-CM | POA: Diagnosis not present

## 2024-03-23 DIAGNOSIS — Z992 Dependence on renal dialysis: Secondary | ICD-10-CM | POA: Diagnosis not present

## 2024-03-25 DIAGNOSIS — N2581 Secondary hyperparathyroidism of renal origin: Secondary | ICD-10-CM | POA: Diagnosis not present

## 2024-03-25 DIAGNOSIS — Z992 Dependence on renal dialysis: Secondary | ICD-10-CM | POA: Diagnosis not present

## 2024-03-25 DIAGNOSIS — N186 End stage renal disease: Secondary | ICD-10-CM | POA: Diagnosis not present

## 2024-03-28 DIAGNOSIS — Z992 Dependence on renal dialysis: Secondary | ICD-10-CM | POA: Diagnosis not present

## 2024-03-28 DIAGNOSIS — N2581 Secondary hyperparathyroidism of renal origin: Secondary | ICD-10-CM | POA: Diagnosis not present

## 2024-03-28 DIAGNOSIS — N186 End stage renal disease: Secondary | ICD-10-CM | POA: Diagnosis not present

## 2024-03-30 DIAGNOSIS — Z992 Dependence on renal dialysis: Secondary | ICD-10-CM | POA: Diagnosis not present

## 2024-03-30 DIAGNOSIS — N2581 Secondary hyperparathyroidism of renal origin: Secondary | ICD-10-CM | POA: Diagnosis not present

## 2024-03-30 DIAGNOSIS — N186 End stage renal disease: Secondary | ICD-10-CM | POA: Diagnosis not present

## 2024-04-04 DIAGNOSIS — N2581 Secondary hyperparathyroidism of renal origin: Secondary | ICD-10-CM | POA: Diagnosis not present

## 2024-04-04 DIAGNOSIS — Z992 Dependence on renal dialysis: Secondary | ICD-10-CM | POA: Diagnosis not present

## 2024-04-04 DIAGNOSIS — N186 End stage renal disease: Secondary | ICD-10-CM | POA: Diagnosis not present

## 2024-04-05 DIAGNOSIS — L853 Xerosis cutis: Secondary | ICD-10-CM | POA: Diagnosis not present

## 2024-04-05 DIAGNOSIS — N186 End stage renal disease: Secondary | ICD-10-CM | POA: Diagnosis not present

## 2024-04-05 DIAGNOSIS — Z0189 Encounter for other specified special examinations: Secondary | ICD-10-CM | POA: Diagnosis not present

## 2024-04-05 DIAGNOSIS — Z0000 Encounter for general adult medical examination without abnormal findings: Secondary | ICD-10-CM | POA: Diagnosis not present

## 2024-04-05 DIAGNOSIS — Z79899 Other long term (current) drug therapy: Secondary | ICD-10-CM | POA: Diagnosis not present

## 2024-04-06 DIAGNOSIS — N186 End stage renal disease: Secondary | ICD-10-CM | POA: Diagnosis not present

## 2024-04-06 DIAGNOSIS — Z992 Dependence on renal dialysis: Secondary | ICD-10-CM | POA: Diagnosis not present

## 2024-04-06 DIAGNOSIS — N2581 Secondary hyperparathyroidism of renal origin: Secondary | ICD-10-CM | POA: Diagnosis not present

## 2024-04-08 DIAGNOSIS — N2581 Secondary hyperparathyroidism of renal origin: Secondary | ICD-10-CM | POA: Diagnosis not present

## 2024-04-08 DIAGNOSIS — Z992 Dependence on renal dialysis: Secondary | ICD-10-CM | POA: Diagnosis not present

## 2024-04-08 DIAGNOSIS — N186 End stage renal disease: Secondary | ICD-10-CM | POA: Diagnosis not present

## 2024-04-11 DIAGNOSIS — N186 End stage renal disease: Secondary | ICD-10-CM | POA: Diagnosis not present

## 2024-04-11 DIAGNOSIS — N2581 Secondary hyperparathyroidism of renal origin: Secondary | ICD-10-CM | POA: Diagnosis not present

## 2024-04-11 DIAGNOSIS — Z992 Dependence on renal dialysis: Secondary | ICD-10-CM | POA: Diagnosis not present

## 2024-04-11 DIAGNOSIS — I129 Hypertensive chronic kidney disease with stage 1 through stage 4 chronic kidney disease, or unspecified chronic kidney disease: Secondary | ICD-10-CM | POA: Diagnosis not present

## 2024-08-02 ENCOUNTER — Ambulatory Visit (INDEPENDENT_AMBULATORY_CARE_PROVIDER_SITE_OTHER)

## 2024-08-02 VITALS — Ht 68.0 in | Wt 132.0 lb

## 2024-08-02 DIAGNOSIS — Z0000 Encounter for general adult medical examination without abnormal findings: Secondary | ICD-10-CM | POA: Diagnosis not present

## 2024-08-02 NOTE — Patient Instructions (Signed)
 Cynthia Bray,  Thank you for taking the time for your Medicare Wellness Visit. I appreciate your continued commitment to your health goals. Please review the care plan we discussed, and feel free to reach out if I can assist you further.  Medicare recommends these wellness visits once per year to help you and your care team stay ahead of potential health issues. These visits are designed to focus on prevention, allowing your provider to concentrate on managing your acute and chronic conditions during your regular appointments.  Please note that Annual Wellness Visits do not include a physical exam. Some assessments may be limited, especially if the visit was conducted virtually. If needed, we may recommend a separate in-person follow-up with your provider.  Ongoing Care Seeing your primary care provider every 3 to 6 months helps us  monitor your health and provide consistent, personalized care. Next office visit on 09/22/2024.  You are due for a 2nd Shingles vaccine and can get that done at your pharmacy.    Referrals If a referral was made during today's visit and you haven't received any updates within two weeks, please contact the referred provider directly to check on the status.  Recommended Screenings:  Health Maintenance  Topic Date Due   COVID-19 Vaccine (3 - Moderna risk series) 01/20/2020   Zoster (Shingles) Vaccine (2 of 2) 03/03/2023   Medicare Annual Wellness Visit  01/06/2024   Pneumococcal Vaccine for age over 6 (3 of 3 - PCV20 or PCV21) 03/06/2024   Flu Shot  05/13/2024   Breast Cancer Screening  10/28/2025   Colon Cancer Screening  12/24/2027   Pap with HPV screening  01/06/2028   DTaP/Tdap/Td vaccine (2 - Td or Tdap) 01/05/2033   Hepatitis C Screening  Completed   HIV Screening  Completed   Hepatitis B Vaccine  Aged Out   HPV Vaccine  Aged Out   Meningitis B Vaccine  Aged Out       08/02/2024    2:10 PM  Advanced Directives  Does Patient Have a Medical Advance  Directive? No   Advance Care Planning is important because it: Ensures you receive medical care that aligns with your values, goals, and preferences. Provides guidance to your family and loved ones, reducing the emotional burden of decision-making during critical moments.  Vision: Annual vision screenings are recommended for early detection of glaucoma, cataracts, and diabetic retinopathy. These exams can also reveal signs of chronic conditions such as diabetes and high blood pressure.  Dental: Annual dental screenings help detect early signs of oral cancer, gum disease, and other conditions linked to overall health, including heart disease and diabetes.  Please see the attached documents for additional preventive care recommendations.

## 2024-08-02 NOTE — Progress Notes (Signed)
 Subjective:   Cynthia Bray is a 63 y.o. who presents for a Medicare Wellness preventive visit.  As a reminder, Annual Wellness Visits don't include a physical exam, and some assessments may be limited, especially if this visit is performed virtually. We may recommend an in-person follow-up visit with your provider if needed.  Visit Complete: Virtual I connected with  [name] on 08/02/24 by a audio enabled telemedicine application and verified that I am speaking with the correct person using two identifiers.  Patient Location: Home  Provider Location: Home Office  I discussed the limitations of evaluation and management by telemedicine. The patient expressed understanding and agreed to proceed.  Vital Signs: Because this visit was a virtual/telehealth visit, some criteria may be missing or patient reported. Any vitals not documented were not able to be obtained and vitals that have been documented are patient reported.  VideoDeclined- This patient declined Librarian, academic. Therefore the visit was completed with audio only.  Persons Participating in Visit: Patient.  AWV Questionnaire: No: Patient Medicare AWV questionnaire was not completed prior to this visit.  Cardiac Risk Factors include: advanced age (>76men, >68 women);dyslipidemia;hypertension     Objective:    Today's Vitals   08/02/24 1404  Weight: 132 lb (59.9 kg)  Height: 5' 8 (1.727 m)   Body mass index is 20.07 kg/m.     08/02/2024    2:10 PM 10/13/2023   10:08 AM 02/23/2023   10:50 AM 01/06/2023    9:09 AM 11/19/2022   11:17 PM 09/20/2022    2:00 PM 09/20/2022    1:38 AM  Advanced Directives  Does Patient Have a Medical Advance Directive? No No No No No No No  Would patient like information on creating a medical advance directive?    Yes (Inpatient - patient defers creating a medical advance directive at this time - Information given)  Yes (Inpatient - patient requests  chaplain consult to create a medical advance directive)     Current Medications (verified) Outpatient Encounter Medications as of 08/02/2024  Medication Sig   acetaminophen  (TYLENOL ) 500 MG tablet Take 1,000 mg by mouth daily as needed for mild pain, moderate pain, fever or headache.   aspirin  EC 81 MG tablet Take 81 mg by mouth in the morning.   benzonatate  (TESSALON  PERLES) 100 MG capsule Take 1 capsule (100 mg total) by mouth 3 (three) times daily as needed for cough.   calcitRIOL  (ROCALTROL ) 0.5 MCG capsule Take 8 capsules (4 mcg total) by mouth daily.   iron sucrose in sodium chloride  0.9 % 100 mL Inject 50 mg into the vein daily.   meclizine (ANTIVERT) 25 MG tablet Take 25 mg by mouth daily as needed for dizziness.   multivitamin (RENA-VIT) TABS tablet Take 1 tablet by mouth at bedtime. (Patient taking differently: Take 1 tablet by mouth in the morning.)   ondansetron  (ZOFRAN ) 4 MG tablet Take 1 tablet (4 mg total) by mouth every 6 (six) hours.   ondansetron  (ZOFRAN -ODT) 4 MG disintegrating tablet Take 1 tablet (4 mg total) by mouth every 8 (eight) hours as needed for nausea or vomiting.   rosuvastatin  (CRESTOR ) 40 MG tablet Take 1 tablet (40 mg total) by mouth daily.   VELPHORO 500 MG chewable tablet Chew 500 mg by mouth 3 (three) times daily with meals.   No facility-administered encounter medications on file as of 08/02/2024.    Allergies (verified) Sensipar  [cinacalcet ]; Aplisol [tuberculin, ppd]; Lisinopril; and Penicillin g   History:  Past Medical History:  Diagnosis Date   CHF (congestive heart failure) (HCC)    EF 25%   ESRD (end stage renal disease) (HCC)    GERD (gastroesophageal reflux disease)    Hypertension    Hyperthyroidism    Renal disorder    Past Surgical History:  Procedure Laterality Date   A/V FISTULAGRAM N/A 02/04/2024   Procedure: A/V Fistulagram;  Surgeon: Melia Lynwood ORN, MD;  Location: [facility] INVASIVE CV LAB;  Service: Cardiovascular;  Laterality: N/A;    AV FISTULA PLACEMENT     CARDIAC CATHETERIZATION     IR FLUORO GUIDE CV LINE RIGHT  04/10/2022   IR REMOVAL TUN CV CATH W/O FL  04/29/2022   IR US  GUIDE VASC ACCESS RIGHT  04/10/2022   PARATHYROIDECTOMY N/A 04/03/2022   Procedure: NECK EXPLORATION, LEFT SUPERIOR PARATHYROIDECTOMY, LEFT INFERIOR PARATHYROIDECTOMY, RIGHT SUPERIOR PARATHYROIDECTOMY;  Surgeon: Eletha Boas, MD;  Location: [facility] OR;  Service: General;  Laterality: N/A;   VASCULAR SURGERY     fistula bilaterally. left arm removed   Family History  Problem Relation Age of Onset   Kidney disease Mother    Heart failure Sister    Heart attack Sister    BRCA 1/2 Neg Hx    Breast cancer Neg Hx    Social History   Socioeconomic History   Marital status: Widowed    Spouse name: Not on file   Number of children: Not on file   Years of education: Not on file   Highest education level: Not on file  Occupational History   Occupation: Disabled/RETIRED  Tobacco Use   Smoking status: Never    Passive exposure: Never   Smokeless tobacco: Never  Vaping Use   Vaping status: Never Used  Substance and Sexual Activity   Alcohol use: Never   Drug use: Never   Sexual activity: Not on file  Other Topics Concern   Not on file  Social History Narrative   Lives alone/2025   Social Drivers of Health   Financial Resource Strain: Low Risk  (08/02/2024)   Overall Financial Resource Strain (CARDIA)    Difficulty of Paying Living Expenses: Not very hard  Food Insecurity: No Food Insecurity (08/02/2024)   Hunger Vital Sign    Worried About Running Out of Food in the Last Year: Never true    Ran Out of Food in the Last Year: Never true  Transportation Needs: No Transportation Needs (08/02/2024)   PRAPARE - Administrator, Civil Service (Medical): No    Lack of Transportation (Non-Medical): No  Physical Activity: Inactive (08/02/2024)   Exercise Vital Sign    Days of Exercise per Week: 0 days    Minutes of Exercise per  Session: 0 min  Stress: No Stress Concern Present (08/02/2024)   [employer] of Occupational Health - Occupational Stress Questionnaire    Feeling of Stress: Not at all  Social Connections: Moderately Isolated (08/02/2024)   Social Connection and Isolation Panel    Frequency of Communication with Friends and Family: More than three times a week    Frequency of Social Gatherings with Friends and Family: Once a week    Attends Religious Services: 1 to 4 times per year    Active Member of [employer] or Organizations: No    Attends Banker Meetings: Never    Marital Status: Widowed    Tobacco Counseling Counseling given: Not Answered    Clinical Intake:  Pre-visit preparation completed: Yes  Pain : No/denies  pain     BMI - recorded: 20.07 Nutritional Status: BMI of 19-24  Normal Nutritional Risks: None  Lab Results  Component Value Date   HGBA1C 5.4 09/22/2023     How often do you need to have someone help you when you read instructions, pamphlets, or other written materials from your doctor or pharmacy?: 1 - Never  Interpreter Needed?: No  Information entered by :: Saida Lonon, RMA   Activities of Daily Living     08/02/2024    2:05 PM 02/04/2024    1:16 PM  In your present state of health, do you have any difficulty performing the following activities:  Hearing? 0 0  Vision? 0 0  Difficulty concentrating or making decisions? 0 0  Walking or climbing stairs? 0   Dressing or bathing? 0   Doing errands, shopping? 0   Comment does not have a car/ SCAT transportation/Brother in Probation officer Food and eating ? N   Using the Toilet? N   In the past six months, have you accidently leaked urine? N   Do you have problems with loss of bowel control? N   Managing your Medications? N   Managing your Finances? N   Housekeeping or managing your Housekeeping? N     Patient Care Team: Lorren Greig PARAS, NP as PCP - General (Nurse Practitioner) Raford Riggs, MD as Attending Physician (Cardiology) [place], [facility]  I have updated your Care Teams any recent Medical Services you may have received from other providers in the past year.     Assessment:   This is a routine wellness examination for Cynthia Bray.  Hearing/Vision screen Hearing Screening - Comments:: Denies hearing difficulties   Vision Screening - Comments:: Denies vision issues./No eye provider    Goals Addressed             This Visit's Progress    Patient Stated       Trying to get better/2025       Depression Screen     08/02/2024    2:13 PM 01/06/2023    9:09 AM 10/07/2022   10:11 AM 07/31/2022    8:48 AM  PHQ 2/9 Scores  PHQ - 2 Score 0 0 0 0  PHQ- 9 Score 0       Fall Risk     08/02/2024    2:11 PM 09/22/2023    9:41 AM 01/06/2023    9:09 AM 07/31/2022    8:48 AM  Fall Risk   Falls in the past year? 0 0 0 0  Number falls in past yr: 0 0 0 0  Injury with Fall? 0 0 0 0  Risk for fall due to :  No Fall Risks No Fall Risks No Fall Risks  Follow up Falls evaluation completed;Falls prevention discussed  Falls evaluation completed Falls evaluation completed      Data saved with a previous flowsheet row definition    MEDICARE RISK AT HOME:  Medicare Risk at Home Any stairs in or around the home?: Yes (has a elavator) If so, are there any without handrails?: No Home free of loose throw rugs in walkways, pet beds, electrical cords, etc?: Yes Adequate lighting in your home to reduce risk of falls?: Yes Life alert?: No Use of a cane, walker or w/c?: No Grab bars in the bathroom?: Yes Shower chair or bench in shower?: No Elevated toilet seat or a handicapped toilet?: No  TIMED UP AND GO:  Was the test performed?  No  Cognitive Function: Declined/Normal: No cognitive concerns noted by patient or family. Patient alert, oriented, able to answer questions appropriately and recall recent events. No signs of memory loss or  confusion.        01/06/2023    9:10 AM  6CIT Screen  What Year? 0 points  What month? 0 points  What time? 0 points  Count back from 20 0 points  Months in reverse 0 points  Repeat phrase 0 points  Total Score 0 points    Immunizations Immunization History  Administered Date(s) Administered   Influenza Split 08/19/2018   Influenza Whole 08/19/2018   Influenza, Quadrivalent, Recombinant, Inj, Pf 08/08/2022   Influenza,inj,Quad PF,6+ Mos 07/13/2020, 07/12/2021   Influenza-Unspecified 07/15/2019, 07/13/2020   Moderna Sars-Covid-2 Vaccination 11/25/2019, 12/23/2019   Pneumococcal Conjugate-13 12/29/2018   Pneumococcal Polysaccharide-23 03/07/2019   Tdap 01/06/2023   Zoster Recombinant(Shingrix ) 01/06/2023    Screening Tests Health Maintenance  Topic Date Due   COVID-19 Vaccine (3 - Moderna risk series) 01/20/2020   Zoster Vaccines- Shingrix  (2 of 2) 03/03/2023   Medicare Annual Wellness (AWV)  01/06/2024   Pneumococcal Vaccine: 50+ Years (3 of 3 - PCV20 or PCV21) 03/06/2024   Influenza Vaccine  05/13/2024   Mammogram  10/28/2025   Colonoscopy  12/24/2027   Cervical Cancer Screening (HPV/Pap Cotest)  01/06/2028   DTaP/Tdap/Td (2 - Td or Tdap) 01/05/2033   Hepatitis C Screening  Completed   HIV Screening  Completed   Hepatitis B Vaccines 19-59 Average Risk  Aged Out   HPV VACCINES  Aged Out   Meningococcal B Vaccine  Aged Out    Health Maintenance Items Addressed: See Nurse Notes at the end of this note  Additional Screening:  Vision Screening: Recommended annual ophthalmology exams for early detection of glaucoma and other disorders of the eye. Is the patient up to date with their annual eye exam?  No  Who is the provider or what is the name of the office in which the patient attends annual eye exams? Has no provider/ Patient not up to date.  Dental Screening: Recommended annual dental exams for proper oral hygiene  Community Resource Referral / Chronic Care  Management: CRR required this visit?  No   CCM required this visit?  No   Plan:    I have personally reviewed and noted the following in the patient's chart:   Medical and social history Use of alcohol, tobacco or illicit drugs  Current medications and supplements including opioid prescriptions. Patient is not currently taking opioid prescriptions. Functional ability and status Nutritional status Physical activity Advanced directives List of other physicians Hospitalizations, surgeries, and ER visits in previous 12 months Vitals Screenings to include cognitive, depression, and falls Referrals and appointments  In addition, I have reviewed and discussed with patient certain preventive protocols, quality metrics, and best practice recommendations. A written personalized care plan for preventive services as well as general preventive health recommendations were provided to patient.   Edsel Shives L Gracey Tolle, CMA   08/02/2024   After Visit Summary: (MyChart) Due to this being a telephonic visit, the after visit summary with patients personalized plan was offered to patient via MyChart   Notes: Patient is due for a 2nd Shingles.  Patient stated that she is up to date on Flu and pneumonia.  She had no other concerns to address today.

## 2024-08-15 ENCOUNTER — Telehealth: Payer: Self-pay | Admitting: Family

## 2024-08-16 ENCOUNTER — Telehealth: Payer: Self-pay

## 2024-08-16 NOTE — Telephone Encounter (Signed)
 Copied from CRM #[id number]. Topic: General - Other >> Aug 15, 2024  4:19 PM Lonell PEDLAR wrote: Reason for CRM: Patient's son, Carlin, is calling requesting an update on death certificate. Please call back at [phone]

## 2024-08-17 ENCOUNTER — Telehealth: Payer: Self-pay | Admitting: Family

## 2024-08-17 NOTE — Telephone Encounter (Signed)
 I called patient son back however the phone number was not in service

## 2024-08-17 NOTE — Telephone Encounter (Signed)
 Copied from CRM #[id number]. Topic: General - Other >> Aug 15, 2024  4:19 PM Lonell PEDLAR wrote: Reason for CRM: Patient's son, Carlin, is calling requesting an update on death certificate. Please call back at [phone] >> Aug 17, 2024 12:58 PM Larissa RAMAN wrote: Carlin Cable, son, requesting to have death certificate resent to funeral home.   Name of funeral home: [facility]   Phone number of funeral home: [phone]

## 2024-08-17 NOTE — Telephone Encounter (Signed)
 Death certificate completed on 08/26/2024 12:48 PM.

## 2024-08-18 NOTE — Telephone Encounter (Signed)
 I called patient son and his phone was not working

## 2024-08-18 NOTE — Telephone Encounter (Signed)
 I called patient son back and his phone was not working.  I called funeral home  and they stated they did not need a copy because they have it and they gave him a copy this am to give to housing authority

## 2024-09-12 NOTE — Telephone Encounter (Signed)
 I called and spoke with MS. Cynthia Bray and made her aware that death certificate was already completed

## 2024-09-12 NOTE — Telephone Encounter (Signed)
 Copied from CRM [id number]. Topic: General - Deceased Patient >> 2024/08/17  1:34 PM Antony RAMAN wrote: Name of caller: Karna Dudley  Date of death: 08/17/2024  Name of funeral home: [place] [facility]  Phone number of funeral home: [phone]  Provider that needs to sign form: Amy Lorren  Timeline for signing: asap

## 2024-09-12 DEATH — deceased

## 2024-09-22 ENCOUNTER — Encounter: Payer: Medicare HMO | Admitting: Family
